# Patient Record
Sex: Female | Born: 1985 | Race: Black or African American | Hispanic: No | Marital: Single | Smoking: Current every day smoker
Health system: Southern US, Community
[De-identification: ages and names within clinical notes are randomized; demographics above are authoritative.]

## PROBLEM LIST (undated history)

## (undated) ENCOUNTER — Emergency Department (HOSPITAL_COMMUNITY): Admission: EM | Payer: Self-pay | Source: Home / Self Care

## (undated) DIAGNOSIS — F32A Depression, unspecified: Secondary | ICD-10-CM

## (undated) DIAGNOSIS — F329 Major depressive disorder, single episode, unspecified: Secondary | ICD-10-CM

## (undated) DIAGNOSIS — D649 Anemia, unspecified: Secondary | ICD-10-CM

## (undated) DIAGNOSIS — F319 Bipolar disorder, unspecified: Secondary | ICD-10-CM

## (undated) DIAGNOSIS — M779 Enthesopathy, unspecified: Secondary | ICD-10-CM

## (undated) DIAGNOSIS — K219 Gastro-esophageal reflux disease without esophagitis: Secondary | ICD-10-CM

---

## 2016-01-28 ENCOUNTER — Ambulatory Visit (HOSPITAL_COMMUNITY)
Admission: EM | Admit: 2016-01-28 | Discharge: 2016-01-28 | Disposition: A | Discharge status: Home / Self Care | Payer: Medicaid Other | Attending: Family Medicine | Admitting: Family Medicine

## 2016-01-28 ENCOUNTER — Encounter (HOSPITAL_COMMUNITY): Payer: Self-pay | Admitting: Emergency Medicine

## 2016-01-28 DIAGNOSIS — M545 Low back pain: Secondary | ICD-10-CM | POA: Insufficient documentation

## 2016-01-28 DIAGNOSIS — F17210 Nicotine dependence, cigarettes, uncomplicated: Secondary | ICD-10-CM | POA: Insufficient documentation

## 2016-01-28 DIAGNOSIS — R350 Frequency of micturition: Secondary | ICD-10-CM

## 2016-01-28 HISTORY — DX: Bipolar disorder, unspecified: F31.9

## 2016-01-28 HISTORY — DX: Major depressive disorder, single episode, unspecified: F32.9

## 2016-01-28 HISTORY — DX: Depression, unspecified: F32.A

## 2016-01-28 LAB — POCT URINALYSIS DIP (DEVICE)
BILIRUBIN URINE: NEGATIVE
GLUCOSE, UA: NEGATIVE mg/dL
Hgb urine dipstick: NEGATIVE
KETONES UR: NEGATIVE mg/dL
Leukocytes, UA: NEGATIVE
Nitrite: NEGATIVE
PH: 6 (ref 5.0–8.0)
Protein, ur: NEGATIVE mg/dL
SPECIFIC GRAVITY, URINE: 1.025 (ref 1.005–1.030)
Urobilinogen, UA: 0.2 mg/dL (ref 0.0–1.0)

## 2016-01-28 LAB — POCT PREGNANCY, URINE: PREG TEST UR: NEGATIVE

## 2016-01-28 MED ORDER — CIPROFLOXACIN HCL 500 MG PO TABS: 500.0000 mg | ORAL_TABLET | Freq: Two times a day (BID) | ORAL | 0 refills | Status: DC

## 2016-01-28 NOTE — ED Provider Notes (Signed)
[facility]    CSN: [id number] Arrival date & time: 01/28/16  1731     History   Chief Complaint Chief Complaint  Patient presents with  . Urinary Frequency    HPI Dawn Horton is a 30 y.o. female.   30 yo woman with 5-6 days of urinary frequency, lower abdominal discomfort and low back pain.  She's had UTI's before that felt like this.  No vaginal discharge.  LMP:  October.  Has Nexplanon, replaced last month.  Same partner for months.  Was recently tested for STD Unemployed.  Results for orders placed or performed during the hospital encounter of 01/28/16 -POCT urinalysis dip (device)      Result                                            Value                         Ref Range                      Glucose, UA                                       NEGATIVE                      NEGATIVE mg/dL                 Bilirubin Urine                                   NEGATIVE                      NEGATIVE                       Ketones, ur                                       NEGATIVE                      NEGATIVE mg/dL                 Specific Gravity, Urine                           1.025                         1.005 - 1.030                  Hgb urine dipstick                                NEGATIVE                      NEGATIVE  pH                                                6.0                           5.0 - 8.0                      Protein, ur                                       NEGATIVE                      NEGATIVE mg/dL                 Urobilinogen, UA                                  0.2                           0.0 - 1.0 mg/dL                Nitrite                                           NEGATIVE                      NEGATIVE                       Leukocytes, UA                                    NEGATIVE                      NEGATIVE                        Past Medical History:  Diagnosis Date  . Bipolar 1 disorder (HCC)   .  Depression     There are no active problems to display for this patient.   Past Surgical History:  Procedure Laterality Date  . CESAREAN SECTION      OB History    No data available       Home Medications    Prior to Admission medications   Medication Sig Start Date End Date Taking? Authorizing Provider  risperiDONE (RISPERDAL) 2 MG tablet Take 2 mg by mouth at bedtime.   Yes Historical Provider, MD  ciprofloxacin (CIPRO) 500 MG tablet Take 1 tablet (500 mg total) by mouth 2 (two) times daily. 01/28/16   Elvina SidleKurt Lindsie Simar, MD    Family History History reviewed. No pertinent family history.  Social History Social History  Substance Use Topics  . Smoking status: Current Every Day Smoker    Packs/day: 1.00    Years: 15.00    Types: Cigarettes  .  Smokeless tobacco: Never Used  . Alcohol use Yes     Comment: Occ     Allergies   Penicillins   Review of Systems Review of Systems  Constitutional: Negative.   Gastrointestinal: Negative for abdominal pain.  Genitourinary: Positive for frequency.     Physical Exam Triage Vital Signs ED Triage Vitals  Enc Vitals Group     BP 01/28/16 1803 113/67     Pulse Rate 01/28/16 1803 69     Resp 01/28/16 1803 18     Temp 01/28/16 1803 98.2 F (36.8 C)     Temp Source 01/28/16 1803 Oral     SpO2 01/28/16 1803 100 %     Weight --      Height --      Head Circumference --      Peak Flow --      Pain Score 01/28/16 1810 0     Pain Loc --      Pain Edu? --      Excl. in GC? --    No data found.   Updated Vital Signs BP 113/67 (BP Location: Right Arm)   Pulse 69   Temp 98.2 F (36.8 C) (Oral)   Resp 18   SpO2 100%    Physical Exam  Constitutional: She is oriented to person, place, and time. She appears well-developed and well-nourished.  HENT:  Right Ear: External ear normal.  Left Ear: External ear normal.  Mouth/Throat: Oropharynx is clear and moist.  Eyes: Conjunctivae and EOM are normal.  Neck: Normal  range of motion. Neck supple.  Pulmonary/Chest: Effort normal.  Musculoskeletal: Normal range of motion.  Neurological: She is alert and oriented to person, place, and time.  Skin: Skin is warm and dry.  Nursing note and vitals reviewed.    UC Treatments / Results  Labs (all labs ordered are listed, but only abnormal results are displayed) Labs Reviewed  URINE CULTURE  POCT URINALYSIS DIP (DEVICE)  POCT PREGNANCY, URINE    EKG  EKG Interpretation None       Radiology No results found.  Procedures Procedures (including critical care time)  Medications Ordered in UC Medications - No data to display   Initial Impression / Assessment and Plan / UC Course  I have reviewed the triage vital signs and the nursing notes.  Pertinent labs & imaging results that were available during my care of the patient were reviewed by me and considered in my medical decision making (see chart for details).  Clinical Course     Final Clinical Impressions(s) / UC Diagnoses   Final diagnoses:  Urinary frequency    New Prescriptions New Prescriptions   CIPROFLOXACIN (CIPRO) 500 MG TABLET    Take 1 tablet (500 mg total) by mouth 2 (two) times daily.     Elvina SidleKurt Kameo Bains, MD 01/28/16 [phone]

## 2016-01-28 NOTE — ED Triage Notes (Signed)
The patient presented to the [facility] with a complaint of lower abdominal pain and urinary frequency and urgency x 4 days.

## 2016-01-29 LAB — URINE CULTURE

## 2016-02-01 ENCOUNTER — Telehealth (HOSPITAL_COMMUNITY): Payer: Self-pay | Admitting: Emergency Medicine

## 2016-02-01 NOTE — Telephone Encounter (Signed)
-----   Message from Eustace MooreLaura W Murray, MD sent at 02/01/2016 11:18 AM EST ----- Clinical staff, please let patient know that urine culture does not clearly demonstrate a UTI.  If urinary symptoms persist, needs further evaluation for other causes of her symptoms.  LM

## 2016-02-01 NOTE — Telephone Encounter (Signed)
Called pt and notified of recent lab results Pt ID'd properly... Reports feeling better but still having lower abd pressure Pt is taking/tolarating well meds given at visit.  Adv pt if sx are not getting better to return or to f/u w/PCP Pt verb understanding.

## 2016-03-31 ENCOUNTER — Emergency Department (HOSPITAL_COMMUNITY)
Admission: EM | Admit: 2016-03-31 | Discharge: 2016-03-31 | Disposition: A | Discharge status: Home / Self Care | Payer: Medicaid Other | Attending: Emergency Medicine | Admitting: Emergency Medicine

## 2016-03-31 ENCOUNTER — Encounter (HOSPITAL_COMMUNITY): Payer: Self-pay | Admitting: Emergency Medicine

## 2016-03-31 ENCOUNTER — Emergency Department (HOSPITAL_COMMUNITY): Payer: Medicaid Other

## 2016-03-31 DIAGNOSIS — R0789 Other chest pain: Secondary | ICD-10-CM | POA: Diagnosis not present

## 2016-03-31 DIAGNOSIS — F17210 Nicotine dependence, cigarettes, uncomplicated: Secondary | ICD-10-CM | POA: Diagnosis not present

## 2016-03-31 DIAGNOSIS — R079 Chest pain, unspecified: Secondary | ICD-10-CM

## 2016-03-31 MED ORDER — IBUPROFEN 800 MG PO TABS
800.0000 mg | ORAL_TABLET | Freq: Once | ORAL | Status: DC
  Filled 2016-03-31: qty 1
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
## 2016-03-31 NOTE — ED Provider Notes (Signed)
[facility]   CSN: [id number] Arrival date & time: 03/31/16  1614    History   Chief Complaint Chief Complaint  Patient presents with  . Chest Pain    HPI Dawn Horton is a 31 y.o. female.  31 year old female with a history of bipolar disorder and depression presents to the emergency department for multiple complaints. She states that she primarily presented for left-sided chest pain which has been constant over the past week. She describes the pain as a tightness with intermittent sharp pain sensations. It is worse when sitting up. She states that she took Tylenol for symptoms with mild relief. She has not had any associated diaphoresis or shortness of breath. No history of trauma or injury. She does report heavy lifting of her children, ages 615 and 6611, at times. Patient was secondary complaint of sporadic right temporal headache. She denies any pain at present. She has also been having right knee pain intermittently. She states that she has a history of tendinitis and this feels similar.      Past Medical History:  Diagnosis Date  . Bipolar 1 disorder (HCC)   . Depression     There are no active problems to display for this patient.   Past Surgical History:  Procedure Laterality Date  . CESAREAN SECTION      OB History    No data available       Home Medications    Prior to Admission medications   Medication Sig Start Date End Date Taking? Authorizing Provider  ciprofloxacin (CIPRO) 500 MG tablet Take 1 tablet (500 mg total) by mouth 2 (two) times daily. 01/28/16   Elvina SidleKurt Lauenstein, MD  risperiDONE (RISPERDAL) 2 MG tablet Take 2 mg by mouth at bedtime.    Historical Provider, MD    Family History History reviewed. No pertinent family history.  Social History Social History  Substance Use Topics  . Smoking status: Current Every Day Smoker    Packs/day: 1.00    Years: 15.00    Types: Cigarettes  . Smokeless tobacco: Never Used  .  Alcohol use Yes     Comment: Occ     Allergies   Penicillins   Review of Systems Review of Systems Ten systems reviewed and are negative for acute change, except as noted in the HPI.    Physical Exam Updated Vital Signs BP 120/75   Pulse (!) 51   Temp 98.6 F (37 C) (Oral)   Resp 18   SpO2 95%   Physical Exam  Constitutional: She is oriented to person, place, and time. She appears well-developed and well-nourished. No distress.  Nontoxic and in no acute distress  HENT:  Head: Normocephalic and atraumatic.  Eyes: Conjunctivae and EOM are normal. No scleral icterus.  Neck: Normal range of motion.  No meningismus  Cardiovascular: Normal rate, regular rhythm and intact distal pulses.   Pulmonary/Chest: Effort normal. No respiratory distress. She has no wheezes. She has no rales. She exhibits tenderness (minimal TTP to left chest wall.).  Lungs clear to auscultation bilaterally  Musculoskeletal: Normal range of motion.  Normal range of motion of the right lower extremity and knee. Mild tenderness to palpation to the medial aspect of the right knee. No effusion, crepitus, or deformity.  Neurological: She is alert and oriented to person, place, and time. She exhibits normal muscle tone. Coordination normal.  Skin: Skin is warm and dry. No rash noted. She is not diaphoretic. No erythema. No pallor.  Psychiatric:  She has a normal mood and affect. Her behavior is normal.  Nursing note and vitals reviewed.    ED Treatments / Results  Labs (all labs ordered are listed, but only abnormal results are displayed) Labs Reviewed  I-STAT CHEM 8, ED  Rosezena Sensor, ED    ED ECG REPORT   Date: 03/31/2016  Rate: 76  Rhythm: normal sinus rhythm  QRS Axis: normal  Intervals: normal  ST/T Wave abnormalities: normal  Conduction Disutrbances:none  Narrative Interpretation: NSR; no acute ischemic change or STEMI  Old EKG Reviewed: none available  I have personally reviewed the  EKG tracing and agree with the computerized printout as noted.   Radiology Dg Chest 2 View  Result Date: 03/31/2016 CLINICAL DATA:  Acute onset of sharp mid chest pain. Initial encounter. EXAM: CHEST  2 VIEW COMPARISON:  None. FINDINGS: The lungs are well-aerated and clear. There is no evidence of focal opacification, pleural effusion or pneumothorax. The heart is normal in size; the mediastinal contour is within normal limits. No acute osseous abnormalities are seen. IMPRESSION: No acute cardiopulmonary process seen. Electronically Signed   By: Roanna Raider M.D.   On: 03/31/2016 17:11    Procedures Procedures (including critical care time)  Medications Ordered in ED Medications  ibuprofen (ADVIL,MOTRIN) tablet 800 mg (not administered)     Initial Impression / Assessment and Plan / ED Course  I have reviewed the triage vital signs and the nursing notes.  Pertinent labs & imaging results that were available during my care of the patient were reviewed by me and considered in my medical decision making (see chart for details).     31 year old female presents to the emergency department for evaluation of chest pain 1 week. She is hemodynamically stable and in no distress. No history of heart attack and heart score is 1 consistent with low risk of acute coronary event. Patient with reassuring chest x-ray and EKG. I recommended chemistry panel and rapid troponin. Patient agreed to plan; however, I was approached by nurse about 5 minutes after leaving the patient's room and was notified that she eloped.   Final Clinical Impressions(s) / ED Diagnoses   Final diagnoses:  Nonspecific chest pain    New Prescriptions New Prescriptions   No medications on file     Antony Madura, Cordelia Poche 03/31/16 2119    Lorre Nick, MD 04/03/16 1445

## 2016-03-31 NOTE — ED Triage Notes (Signed)
Pt sts CP with cough and inspiration and some lower abd pain and right knee pain

## 2016-05-20 ENCOUNTER — Encounter (HOSPITAL_COMMUNITY): Payer: Self-pay | Admitting: Emergency Medicine

## 2016-05-20 ENCOUNTER — Emergency Department (HOSPITAL_COMMUNITY)
Admission: EM | Admit: 2016-05-20 | Discharge: 2016-05-20 | Disposition: A | Discharge status: Home / Self Care | Payer: Medicaid Other | Attending: Emergency Medicine | Admitting: Emergency Medicine

## 2016-05-20 DIAGNOSIS — Z760 Encounter for issue of repeat prescription: Secondary | ICD-10-CM | POA: Diagnosis present

## 2016-05-20 DIAGNOSIS — F419 Anxiety disorder, unspecified: Secondary | ICD-10-CM | POA: Insufficient documentation

## 2016-05-20 DIAGNOSIS — F17210 Nicotine dependence, cigarettes, uncomplicated: Secondary | ICD-10-CM | POA: Diagnosis not present

## 2016-05-20 MED ORDER — RISPERIDONE 2 MG PO TABS: 2.0000 mg | ORAL_TABLET | Freq: Every day | ORAL | 0 refills | Status: AC

## 2016-05-20 NOTE — ED Provider Notes (Signed)
[facility] Note   CSN: [id number] Arrival date & time: 05/20/16  1926  By signing my name below, I, Cynda Acres, attest that this documentation has been prepared under the direction and in the presence of Roxy Horseman, PA-C. Electronically Signed: Cynda Acres, Scribe. 05/20/16. 9:55 PM.  History   Chief Complaint Chief Complaint  Patient presents with  . Medication Refill   HPI Comments: Dawn Horton is a 31 y.o. female with a history of bipolar and depression, who presents to the Emergency Department requesting a medication refill for Risperdal, which she ran out of 3 months ago. Patient states she has not had her medication in 3 months. Patient states she has been speaking with [facility], who she usually goes to, but they are not answering her phone calls. Patient reports having an appointment in February, in which the nurse rescheduled the appointment. Patient states she went to [facility] today, but felt if they were ignoring her. Patient reports drinking alcohol over the weekend, denies drug use. Patient denies any SI, HI, SOB, nausea, vomiting, diarrhea, or abdominal pain.   The history is provided by the patient. No language interpreter was used.    Past Medical History:  Diagnosis Date  . Bipolar 1 disorder (HCC)   . Depression     There are no active problems to display for this patient.   Past Surgical History:  Procedure Laterality Date  . CESAREAN SECTION      OB History    No data available       Home Medications    Prior to Admission medications   Medication Sig Start Date End Date Taking? Authorizing Provider  ciprofloxacin (CIPRO) 500 MG tablet Take 1 tablet (500 mg total) by mouth 2 (two) times daily. 01/28/16   Elvina Sidle, MD  risperiDONE (RISPERDAL) 2 MG tablet Take 2 mg by mouth at bedtime.    Historical Provider, MD    Family History History reviewed. No pertinent family history.   Social History Social History    Substance Use Topics  . Smoking status: Current Every Day Smoker    Packs/day: 1.00    Years: 15.00    Types: Cigarettes  . Smokeless tobacco: Never Used  . Alcohol use Yes     Comment: Occ     Allergies   Penicillins   Review of Systems Review of Systems  Respiratory: Negative for shortness of breath.   Gastrointestinal: Negative for abdominal pain, diarrhea, nausea and vomiting.  Psychiatric/Behavioral: Negative for hallucinations, self-injury and suicidal ideas.     Physical Exam Updated Vital Signs BP 93/70 (BP Location: Left Arm) Comment: pt moving  Pulse (!) 120 Comment: pt moving  Temp 98.1 F (36.7 C) (Oral)   Resp 20   SpO2 100%   Physical Exam  Constitutional: She is oriented to person, place, and time. She appears well-developed and well-nourished.  HENT:  Head: Normocephalic.  Eyes: EOM are normal.  Neck: Normal range of motion.  Pulmonary/Chest: Effort normal.  Abdominal: She exhibits no distension.  Musculoskeletal: Normal range of motion.  Neurological: She is alert and oriented to person, place, and time.  Psychiatric: She has a normal mood and affect. She expresses no homicidal and no suicidal ideation.  Nursing note and vitals reviewed.    ED Treatments / Results  DIAGNOSTIC STUDIES: Oxygen Saturation is 100% on RA, normal by my interpretation.    COORDINATION OF CARE: 9:54 PM Discussed treatment plan with pt at bedside and pt agreed to plan,  which includes a medication refill.   Labs (all labs ordered are listed, but only abnormal results are displayed) Labs Reviewed - No data to display  EKG  EKG Interpretation None       Radiology No results found.  Procedures Procedures (including critical care time)  Medications Ordered in ED Medications - No data to display   Initial Impression / Assessment and Plan / ED Course  I have reviewed the triage vital signs and the nursing notes.  Pertinent labs & imaging results that  were available during my care of the patient were reviewed by me and considered in my medical decision making (see chart for details).     Patient here for medication refill. She states that she has not had her Risperdal for several months. She reports trying multiple times to see her primary care doctor and with [facility], but she has been unable to get an appointment. She was ultimately referred to the emergency department for a refill. I notify the patient of this is not how the system is not to work, however I will give the patient a two-week refill as courtesy.  Final Clinical Impressions(s) / ED Diagnoses   Final diagnoses:  Medication refill  Anxiety    New Prescriptions Discharge Medication List as of 05/20/2016  9:52 PM     I personally performed the services described in this documentation, which was scribed in my presence. The recorded information has been reviewed and is accurate.       Roxy Horseman, PA-C 05/20/16 2246    Shaune Pollack, MD 05/21/16 [phone]

## 2016-05-20 NOTE — ED Triage Notes (Signed)
Patient with Bipolar/schizophrenia and has not had her meds for about 3 months.  She has been talking to [facility], usually goes there but they have not returned her phone calls.  She takes Risperidol and would like a refill.

## 2016-05-20 NOTE — ED Notes (Signed)
ED Provider at bedside. 

## 2016-05-20 NOTE — ED Notes (Addendum)
Pt reports being out of medication since December. She states that she went to [facility] and they canceled her apt.

## 2016-05-21 ENCOUNTER — Emergency Department (HOSPITAL_COMMUNITY)
Admission: EM | Admit: 2016-05-21 | Discharge: 2016-05-21 | Disposition: A | Discharge status: Other Acute Inpatient Hospital | Payer: Medicaid Other | Attending: Emergency Medicine | Admitting: Emergency Medicine

## 2016-05-21 ENCOUNTER — Encounter (HOSPITAL_COMMUNITY): Payer: Self-pay

## 2016-05-21 DIAGNOSIS — F17210 Nicotine dependence, cigarettes, uncomplicated: Secondary | ICD-10-CM | POA: Diagnosis not present

## 2016-05-21 DIAGNOSIS — Z79899 Other long term (current) drug therapy: Secondary | ICD-10-CM | POA: Insufficient documentation

## 2016-05-21 DIAGNOSIS — Z046 Encounter for general psychiatric examination, requested by authority: Secondary | ICD-10-CM | POA: Diagnosis present

## 2016-05-21 DIAGNOSIS — F3110 Bipolar disorder, current episode manic without psychotic features, unspecified: Secondary | ICD-10-CM | POA: Insufficient documentation

## 2016-05-21 DIAGNOSIS — F312 Bipolar disorder, current episode manic severe with psychotic features: Secondary | ICD-10-CM | POA: Diagnosis present

## 2016-05-21 LAB — COMPREHENSIVE METABOLIC PANEL
ALK PHOS: 75 U/L (ref 38–126)
ALT: 15 U/L (ref 14–54)
AST: 14 U/L — AB (ref 15–41)
Albumin: 4.9 g/dL (ref 3.5–5.0)
Anion gap: 13 (ref 5–15)
BUN: 11 mg/dL (ref 6–20)
CALCIUM: 9.5 mg/dL (ref 8.9–10.3)
CHLORIDE: 107 mmol/L (ref 101–111)
CO2: 19 mmol/L — AB (ref 22–32)
CREATININE: 0.88 mg/dL (ref 0.44–1.00)
GFR calc Af Amer: >60 mL/min (ref >60)
GFR calc non Af Amer: >60 mL/min (ref >60)
Glucose, Bld: 87 mg/dL (ref 65–99)
Potassium: 3.3 mmol/L — ABNORMAL LOW (ref 3.5–5.1)
SODIUM: 139 mmol/L (ref 135–145)
Total Bilirubin: 1 mg/dL (ref 0.3–1.2)
Total Protein: 8.4 g/dL — ABNORMAL HIGH (ref 6.5–8.1)

## 2016-05-21 LAB — CBC
HEMATOCRIT: 44 % (ref 36.0–46.0)
HEMOGLOBIN: 14.7 g/dL (ref 12.0–15.0)
MCH: 29.7 pg (ref 26.0–34.0)
MCHC: 33.4 g/dL (ref 30.0–36.0)
MCV: 88.9 fL (ref 78.0–100.0)
Platelets: 155 10*3/uL (ref 150–400)
RBC: 4.95 MIL/uL (ref 3.87–5.11)
RDW: 13.3 % (ref 11.5–15.5)
WBC: 9.9 10*3/uL (ref 4.0–10.5)

## 2016-05-21 LAB — ACETAMINOPHEN LEVEL: Acetaminophen (Tylenol), Serum: 26 ug/mL (ref 10–30)

## 2016-05-21 LAB — ETHANOL: Alcohol, Ethyl (B): <5 mg/dL (ref <5)

## 2016-05-21 LAB — SALICYLATE LEVEL: Salicylate Lvl: <7 mg/dL (ref 2.8–30.0)

## 2016-05-21 MED ORDER — ASENAPINE MALEATE 5 MG SL SUBL: 10.0000 mg | SUBLINGUAL_TABLET | Freq: Two times a day (BID) | SUBLINGUAL | Status: DC | PRN

## 2016-05-21 MED ORDER — ZIPRASIDONE MESYLATE 20 MG IM SOLR: 20.0000 mg | Freq: Once | INTRAMUSCULAR | Status: DC | PRN

## 2016-05-21 MED ORDER — HYDROXYZINE HCL 25 MG PO TABS
25.0000 mg | ORAL_TABLET | Freq: Two times a day (BID) | ORAL | Status: DC
  Filled 2016-05-21: qty 1

## 2016-05-21 MED ORDER — CARBAMAZEPINE 200 MG PO TABS
200.0000 mg | ORAL_TABLET | Freq: Two times a day (BID) | ORAL | Status: DC
Start: 2016-05-21 — End: 2016-05-21

## 2016-05-21 MED ORDER — RISPERIDONE 1 MG PO TBDP
1.0000 mg | ORAL_TABLET | Freq: Two times a day (BID) | ORAL | Status: DC
  Administered 2016-05-21: 1 mg via ORAL
  Filled 2016-05-21 (×2): qty 1

## 2016-05-21 NOTE — ED Provider Notes (Signed)
[facility] Note   CSN: [id number] Arrival date & time: 05/21/16  0247     History   Chief Complaint Chief Complaint  Patient presents with  . IVC    HPI Dawn Horton is a 31 y.o. female.  The history is provided by the patient and medical records. No language interpreter was used.   Dawn Horton is a 31 y.o. female  with a PMH of bipolar disorder and depression who presents to the Emergency Department under IVC. Per IVC paperwork, patient has been hyperverbal, hallucinating and off of her medication for the last week. She bought a large amount of Tylenol and was trying to get the children at home to take several pills. Family felt as if she was a danger to herself and others. Patient denies suicidal or homicidal thoughts and also denies auditory or visual hallucinations. She does state that she is on Risperdal and is taking it as directed, however chart review states that she was seen in the emergency department yesterday for a medication refill after being out of this medication for several months. She was given a 2 week refill of Risperdal at that time. Patient has no complaints.    Past Medical History:  Diagnosis Date  . Bipolar 1 disorder (HCC)   . Depression     There are no active problems to display for this patient.   Past Surgical History:  Procedure Laterality Date  . CESAREAN SECTION      OB History    No data available       Home Medications    Prior to Admission medications   Medication Sig Start Date End Date Taking? Authorizing Provider  ciprofloxacin (CIPRO) 500 MG tablet Take 1 tablet (500 mg total) by mouth 2 (two) times daily. Patient not taking: Reported on 05/21/2016 01/28/16   Elvina Sidle, MD  risperiDONE (RISPERDAL) 2 MG tablet Take 1 tablet (2 mg total) by mouth at bedtime. 05/20/16   Roxy Horseman, PA-C    Family History History reviewed. No pertinent family history.  Social History Social History  Substance Use  Topics  . Smoking status: Current Every Day Smoker    Packs/day: 1.00    Years: 15.00    Types: Cigarettes  . Smokeless tobacco: Never Used  . Alcohol use Yes     Comment: Occ     Allergies   Penicillins   Review of Systems Review of Systems  Constitutional: Negative for chills and fever.  HENT: Negative for congestion.   Eyes: Negative for visual disturbance.  Respiratory: Negative for shortness of breath.   Cardiovascular: Negative for chest pain.  Gastrointestinal: Negative for abdominal pain, nausea and vomiting.  Musculoskeletal: Negative for back pain and neck pain.  Skin: Negative for rash.  Neurological: Negative for headaches.  Psychiatric/Behavioral: Negative for self-injury, sleep disturbance and suicidal ideas.     Physical Exam Updated Vital Signs BP 96/75 (BP Location: Right Arm)   Pulse (!) 113   Temp 98.2 F (36.8 C) (Oral)   Resp 18   SpO2 96%   Physical Exam  Constitutional: She is oriented to person, place, and time. She appears well-developed and well-nourished. No distress.  HENT:  Head: Normocephalic and atraumatic.  Cardiovascular: Normal heart sounds.   No murmur heard. Tachycardic but regular.   Pulmonary/Chest: Effort normal and breath sounds normal. No respiratory distress.  Abdominal: Soft. She exhibits no distension. There is no tenderness.  Musculoskeletal: She exhibits no edema.  Neurological: She is alert  and oriented to person, place, and time.  Skin: Skin is warm and dry.  Nursing note and vitals reviewed.    ED Treatments / Results  Labs (all labs ordered are listed, but only abnormal results are displayed) Labs Reviewed  COMPREHENSIVE METABOLIC PANEL - Abnormal; Notable for the following:       Result Value   Potassium 3.3 (*)    CO2 19 (*)    Total Protein 8.4 (*)    AST 14 (*)    All other components within normal limits  ETHANOL  SALICYLATE LEVEL  ACETAMINOPHEN LEVEL  CBC  RAPID URINE DRUG SCREEN, HOSP  PERFORMED    EKG  EKG Interpretation None       Radiology No results found.  Procedures Procedures (including critical care time)  Medications Ordered in ED Medications  ziprasidone (GEODON) injection 20 mg (not administered)     Initial Impression / Assessment and Plan / ED Course  I have reviewed the triage vital signs and the nursing notes.  Pertinent labs & imaging results that were available during my care of the patient were reviewed by me and considered in my medical decision making (see chart for details).    Dawn Horton is a 31 y.o. female who presents to ED under IVC for concern she is a danger to herself or others. Patient hyperverbal and appears agitated on exam. Labs reviewed and reassuring. Medically cleared with disposition per TTS recommendations.   Final Clinical Impressions(s) / ED Diagnoses   Final diagnoses:  Involuntary commitment    New Prescriptions New Prescriptions   No medications on file       [facility] Nesiah Jump, PA-C 05/21/16 1610    Gilda Crease, MD 05/21/16 [phone]
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
## 2016-05-21 NOTE — BH Assessment (Signed)
[facility] Assessment Progress Note  Per Thedore Mins, MD, this pt requires psychiatric hospitalization at this time.  Pt presents under IVC initiated by her brother and upheld by Dr Jannifer Franklin.  At 15:24 Morrie Sheldon calls from [facility] to report that pt has been accepted to their facility by Dr Wendall Stade to the [facility] A Unit.  EDP Linwood Dibbles, MD concurs with this decision.  Pt's nurse has been notified, and agrees to call report to [phone].  Pt is to be transported via [facility].  Doylene Canning, MA Triage Specialist [phone]

## 2016-05-21 NOTE — ED Notes (Signed)
TTS at bedside. 

## 2016-05-21 NOTE — BH Assessment (Signed)
Assessment Note  Dawn Horton is an 31 y.o. female. Patient presents to [facility] with IVC taken out by brother. Per IVC: "Patient is Bipolar and has Schizophrenia. She hasn't been taking her medications. Patient is hallucinating, talking and laughing out loud, and she is at times aggressive towards others. Patient brought multiple bottles of tylenol and tried to force her kids to take them".  Writer met with patient face to face. She was loud, angry, belligerent, and used an abundant amount or profanity during the TTS assessment. Patient was a poor historian. She was unable to answer questions appropriately. She responded with curse words mostly. She denied suicidal ideations. No HI. Denies AVH's. . She also presented to the [facility] for a separate admission yesterday requesting. During that time she stated that she has not taken her Risperdal for 3 months. She was hospitalized at [facility] for psychiatric reasons in the past. Patient is unable to recall the date of that admission. BAL is negative. UDS was not completed at the time of the assessment.    Diagnosis: Bipolar I Disorder, Manic Episode   Past Medical History:  Past Medical History:  Diagnosis Date  . Bipolar 1 disorder ([place])   . Depression     Past Surgical History:  Procedure Laterality Date  . CESAREAN SECTION      Family History: History reviewed. No pertinent family history.  Social History:  reports that she has been smoking Cigarettes.  She has a 15.00 pack-year smoking history. She has never used smokeless tobacco. She reports that she drinks alcohol. She reports that she does not use drugs.  Additional Social History:  Alcohol / Drug Use Pain Medications: SEE MAR Prescriptions: SEE MAR Over the Counter: SEE MAR History of alcohol / drug use?: No history of alcohol / drug abuse (No UDS completed at the time of the TTS assessment; BAL is negative )  CIWA: CIWA-Ar BP: 136/85 Pulse Rate: (!) 103 COWS:    Allergies:  Allergies   Allergen Reactions  . Penicillins Hives    Home Medications:  (Not in a hospital admission)  OB/GYN Status:  No LMP recorded. Patient has had an implant.  General Assessment Data Location of Assessment: [facility] TTS Assessment: In system Is this a Tele or Face-to-Face Assessment?: Face-to-Face Is this an Initial Assessment or a Re-assessment for this encounter?: Initial Assessment Marital status: Single Maiden name:  (n/a) Is patient pregnant?: No Pregnancy Status: No Living Arrangements: Other (Comment), Children (lives alone with 2 children (11, 5)) Can pt return to current living arrangement?: Yes Admission Status: Voluntary Is patient capable of signing voluntary admission?: No Referral Source: [employer] type:  (Medicaid)     Crisis Care Plan Living Arrangements: Other (Comment), Children (lives alone with 2 children (11, 5)) Legal Guardian: Other: (no legal guardian ) Name of Psychiatrist:  (no psychiatrist ) Name of Therapist:  (no therapist )  Education Status Is patient currently in school?: No Current Grade:  (n/a) Highest grade of school patient has completed:  (unk) Name of school:  (n/a) Contact person:  (n/a)  Risk to self with the past 6 months Suicidal Ideation: No Has patient been a risk to self within the past 6 months prior to admission? : No Suicidal Intent: No Has patient had any suicidal intent within the past 6 months prior to admission? : No Is patient at risk for suicide?: No Suicidal Plan?: No Has patient had any suicidal plan within the past 6 months prior to admission? :  No Access to Means: No What has been your use of drugs/alcohol within the last 12 months?:  (pt denies; per hx patient was drinking over the weekend) Previous Attempts/Gestures: No How many times?:  (0) Other Self Harm Risks:  (denies ) Triggers for Past Attempts: Other (Comment) (denies prior attempts ) Intentional Self Injurious Behavior: None Family  Suicide History: No Recent stressful life event(s): Other (Comment) (patient denies ) Persecutory voices/beliefs?: No Depression: No Depression Symptoms:  (denies ) Substance abuse history and/or treatment for substance abuse?: No Suicide prevention information given to non-admitted patients: Not applicable  Risk to Others within the past 6 months Homicidal Ideation: No Does patient have any lifetime risk of violence toward others beyond the six months prior to admission? : No Thoughts of Harm to Others: No Current Homicidal Intent: No Current Homicidal Plan: No Access to Homicidal Means: No Identified Victim:  (n/a) History of harm to others?: No Assessment of Violence: None Noted Violent Behavior Description:  (patient is calm and cooperative ) Does patient have access to weapons?: No Criminal Charges Pending?: No Does patient have a court date: No Is patient on probation?: No  Psychosis Hallucinations: None noted Delusions: None noted  Mental Status Report Appearance/Hygiene: Disheveled, In scrubs Eye Contact: Fair Motor Activity: Restlessness Speech: Aggressive, Pressured, Rapid, Loud Level of Consciousness: Irritable, Restless Mood: Angry, Preoccupied, Irritable Affect: Angry, Apprehensive, Irritable, Inconsistent with thought content, Preoccupied Anxiety Level: Minimal Thought Processes: Circumstantial, Flight of Ideas, Irrelevant Judgement: Impaired Orientation: Person, Place, Time, Situation Obsessive Compulsive Thoughts/Behaviors: None  Cognitive Functioning Concentration: Decreased Memory: Recent Intact, Remote Intact IQ: Average Insight: Poor Impulse Control: Fair Appetite: Fair Weight Loss:  (none reported) Weight Gain:  (none reported) Sleep: Decreased Total Hours of Sleep:  (varies ) Vegetative Symptoms: None  ADLScreening [facility] Assessment Services) Patient's cognitive ability adequate to safely complete daily activities?: Yes Patient able to  express need for assistance with ADLs?: Yes Independently performs ADLs?: Yes (appropriate for developmental age)  Prior Inpatient Therapy Prior Inpatient Therapy: Yes Prior Therapy Dates:  ("I can't remember") Prior Therapy Facilty/Provider(s):  [facility]) Reason for Treatment:  (Bipolar Disorder)  Prior Outpatient Therapy Prior Outpatient Therapy: No Prior Therapy Dates:  (n/a) Prior Therapy Facilty/Provider(s):  (n/a) Reason for Treatment:  (n/a) Does patient have an ACCT team?: No Does patient have Intensive In-House Services?  : No Does patient have [facility] services? : No Does patient have P4CC services?: No  ADL Screening (condition at time of admission) Patient's cognitive ability adequate to safely complete daily activities?: Yes Is the patient deaf or have difficulty hearing?: No Does the patient have difficulty seeing, even when wearing glasses/contacts?: No Does the patient have difficulty concentrating, remembering, or making decisions?: No Patient able to express need for assistance with ADLs?: Yes Does the patient have difficulty dressing or bathing?: No Independently performs ADLs?: Yes (appropriate for developmental age) Does the patient have difficulty walking or climbing stairs?: No Weakness of Legs: None Weakness of Arms/Hands: None  Home Assistive Devices/Equipment Home Assistive Devices/Equipment: None    Abuse/Neglect Assessment (Assessment to be complete while patient is alone) Physical Abuse: Denies Verbal Abuse: Denies Sexual Abuse: Denies Exploitation of patient/patient's resources: Denies Self-Neglect: Denies     Regulatory affairs officer (For Healthcare) Does Patient Have a Medical Advance Directive?: No Would patient like information on creating a medical advance directive?: No - Patient declined Nutrition Screen- [facility] Adult/[facility] Patient's home diet: Regular  Additional Information 1:1 In Past 12 Months?: No CIRT Risk: No Elopement Risk:  No Does  patient have medical clearance?: Yes     Disposition:  Disposition Initial Assessment Completed for this Encounter: Yes Disposition of Patient: Inpatient treatment program (Dr. Darleene Cleaver and Waylan Boga, DNP, patient meets INPT [place]) Type of inpatient treatment program: Adult  On Site Evaluation by:   Reviewed with Physician:    Waldon Merl 05/21/2016 10:52 AM

## 2016-05-21 NOTE — ED Notes (Signed)
Requested a urine sample. 

## 2016-05-21 NOTE — Progress Notes (Signed)
CSW completed CPS report with [place] DSS worker.   Stacy Gardner, LCSWA Clinical Social Worker [phone]

## 2016-05-21 NOTE — Progress Notes (Addendum)
05/21/16 1353:  LRT introduced self to pt and offered activities.  Pt declined.  Caroll Rancher, LRT/CTRS

## 2016-05-21 NOTE — ED Triage Notes (Signed)
Pt is under IVC by her brother Pt is bipolar and schizophrenic and hasn't been taking her medications Pt is hallucinating, talking and laughing out loud, she is at times aggressive towards other Pt bought multiple bottles of tylenol and tried to force her kids to take them

## 2016-05-21 NOTE — ED Notes (Signed)
Pt refuses to take her scheduled medication

## 2016-05-21 NOTE — ED Provider Notes (Signed)
Pt has been accepted for transfer.  Vitals:   05/21/16 0828 05/21/16 1212  BP: 136/85 115/88  Pulse: (!) 103 100  Resp: 20   Temp: 98.8 F (37.1 C) 99.1 F (37.3 C)   Appears medically stable.   Linwood Dibbles, MD 05/21/16 [phone]

## 2016-05-21 NOTE — ED Notes (Addendum)
Pt transferred from TCU. She appears flat and angry. She essentially declines to answer assessment questions, Art therapist to "look it up, I dont need to be here, I want to go home.". States she was IVC'd by family and she is done with them. Continues to state she wants to go. Support and encouragement provided. Explained that th ultimate decision lies with the Psychiatry team and the writer is here to maintain safety and comfort. She flatly denies SI/HI/AVH but is limited in her responses to other questions. Her family keeps trying to call, but based on pts words, I am going to limit family contact today as I suspect it could result in further decompensation. Safety has been maintained with q15 min obs. Will continue current POC pending disposition.

## 2016-06-11 ENCOUNTER — Ambulatory Visit (HOSPITAL_COMMUNITY)
Admission: EM | Admit: 2016-06-11 | Discharge: 2016-06-11 | Discharge status: Against Medical Advice | Payer: Medicaid Other

## 2016-06-14 ENCOUNTER — Encounter (HOSPITAL_COMMUNITY): Payer: Self-pay | Admitting: *Deleted

## 2016-06-14 ENCOUNTER — Inpatient Hospital Stay (HOSPITAL_COMMUNITY)
Admission: AD | Admit: 2016-06-14 | Discharge: 2016-06-14 | Disposition: A | Discharge status: Home / Self Care | Payer: Medicaid Other | Source: Ambulatory Visit | Attending: Obstetrics and Gynecology | Admitting: Obstetrics and Gynecology

## 2016-06-14 DIAGNOSIS — Z880 Allergy status to penicillin: Secondary | ICD-10-CM | POA: Insufficient documentation

## 2016-06-14 DIAGNOSIS — F319 Bipolar disorder, unspecified: Secondary | ICD-10-CM | POA: Insufficient documentation

## 2016-06-14 DIAGNOSIS — R1030 Lower abdominal pain, unspecified: Secondary | ICD-10-CM | POA: Diagnosis not present

## 2016-06-14 DIAGNOSIS — F17210 Nicotine dependence, cigarettes, uncomplicated: Secondary | ICD-10-CM | POA: Diagnosis not present

## 2016-06-14 DIAGNOSIS — F329 Major depressive disorder, single episode, unspecified: Secondary | ICD-10-CM | POA: Diagnosis not present

## 2016-06-14 DIAGNOSIS — R102 Pelvic and perineal pain: Secondary | ICD-10-CM | POA: Insufficient documentation

## 2016-06-14 LAB — WET PREP, GENITAL
CLUE CELLS WET PREP: NONE SEEN
SPERM: NONE SEEN
Trich, Wet Prep: NONE SEEN
Yeast Wet Prep HPF POC: NONE SEEN

## 2016-06-14 LAB — URINALYSIS, ROUTINE W REFLEX MICROSCOPIC
BILIRUBIN URINE: NEGATIVE
Glucose, UA: NEGATIVE mg/dL
Hgb urine dipstick: NEGATIVE
Ketones, ur: NEGATIVE mg/dL
Leukocytes, UA: NEGATIVE
NITRITE: NEGATIVE
PROTEIN: NEGATIVE mg/dL
SPECIFIC GRAVITY, URINE: 1.008 (ref 1.005–1.030)
pH: 6 (ref 5.0–8.0)

## 2016-06-14 LAB — POCT PREGNANCY, URINE: PREG TEST UR: NEGATIVE

## 2016-06-14 NOTE — MAU Note (Signed)
Pt presents with lower abdominal and vaginal pain that has been intermittent since she had her Nexplanon placed in January.  LMP was 06/10/15.  Since ending her period, the pain has gotten so bad "I can hardly walk."

## 2016-06-14 NOTE — Discharge Instructions (Signed)

## 2016-06-14 NOTE — MAU Provider Note (Signed)
History   G3P2013 no preg in with abd and vaginal pain for 4 days. States she just finished her period but is still having pain. Denies any abnormal discharge. Pt states pain is relieved  by aleve.  CSN: [id number]  Arrival date & time 06/14/16  1406   None     Chief Complaint  Patient presents with  . Abdominal Pain    HPI  Past Medical History:  Diagnosis Date  . Bipolar 1 disorder (H[facility])   . Depression     Past Surgical History:  Procedure Laterality Date  . CESAREAN SECTION      History reviewed. No pertinent family history.  Social History  Substance Use Topics  . Smoking status: Current Every Day Smoker    Packs/day: 1.00    Years: 15.00    Types: Cigarettes  . Smokeless tobacco: Never Used  . Alcohol use Yes     Comment: Occ    OB History    Gravida Para Term Preterm AB Living   0 1 2   SAB TAB Ectopic Multiple Live Births   1 0 0 0 20      Review of Systems  Constitutional: Negative.   HENT: Negative.   Eyes: Negative.   Respiratory: Negative.   Cardiovascular: Negative.   Gastrointestinal: Positive for abdominal pain.  Endocrine: Negative.   Genitourinary: Negative.   Musculoskeletal: Negative.   Skin: Negative.   Allergic/Immunologic: Negative.   Neurological: Negative.   Hematological: Negative.   Psychiatric/Behavioral: Negative.     Allergies  Penicillins  Home Medications    BP 108/71 (BP Location: Left Arm)   Pulse 76   Temp 98.2 F (36.8 C) (Oral)   Resp 18   Ht 5' 4.5" (1.638 m)   Wt 157 lb (71.2 kg)   SpO2 100%   BMI 26.53 kg/m   Physical Exam  Constitutional: She is oriented to person, place, and time. She appears well-developed and well-nourished.  HENT:  Head: Normocephalic.  Eyes: Pupils are equal, round, and reactive to light.  Neck: Normal range of motion.  Cardiovascular: Normal rate, regular rhythm, normal heart sounds and intact distal pulses.   Pulmonary/Chest: Effort normal and breath sounds  normal.  Abdominal: Soft. Bowel sounds are normal.  Genitourinary: Vagina normal and uterus normal.  Musculoskeletal: Normal range of motion.  Neurological: She is alert and oriented to person, place, and time. She has normal reflexes.  Skin: Skin is warm and dry.  Psychiatric: She has a normal mood and affect. Her behavior is normal. Judgment and thought content normal.    MAU Course  Procedures (including critical care time)  Labs Reviewed  URINALYSIS, ROUTINE W REFLEX MICROSCOPIC - Abnormal; Notable for the following:       Result Value   Color, Urine STRAW (*)    All other components within normal limits  WET PREP, GENITAL  RPR  HIV ANTIBODY (ROUTINE TESTING)  POCT PREGNANCY, URINE  GC/CHLAMYDIA PROBE AMP ([place]) NOT AT [facility]   No results found.   No diagnosis found.    MDM  VSS, abd soft non tender. No abnormal discharge. Wet prep neg, u/s neg. Will d/c home. To continue aleve for discomfort.

## 2016-06-15 LAB — HIV ANTIBODY (ROUTINE TESTING W REFLEX): HIV Screen 4th Generation wRfx: NONREACTIVE

## 2016-06-15 LAB — RPR: RPR Ser Ql: NONREACTIVE

## 2016-06-16 LAB — GC/CHLAMYDIA PROBE AMP (~~LOC~~) NOT AT ARMC
Chlamydia: NEGATIVE
NEISSERIA GONORRHEA: NEGATIVE

## 2016-08-31 ENCOUNTER — Inpatient Hospital Stay (HOSPITAL_COMMUNITY)
Admission: AD | Admit: 2016-08-31 | Discharge: 2016-08-31 | Disposition: A | Discharge status: Home / Self Care | Payer: Medicaid Other | Source: Ambulatory Visit | Attending: Obstetrics and Gynecology | Admitting: Obstetrics and Gynecology

## 2016-08-31 ENCOUNTER — Encounter (HOSPITAL_COMMUNITY): Payer: Self-pay | Admitting: *Deleted

## 2016-08-31 DIAGNOSIS — Z880 Allergy status to penicillin: Secondary | ICD-10-CM | POA: Diagnosis not present

## 2016-08-31 DIAGNOSIS — O99331 Smoking (tobacco) complicating pregnancy, first trimester: Secondary | ICD-10-CM | POA: Diagnosis not present

## 2016-08-31 DIAGNOSIS — F17210 Nicotine dependence, cigarettes, uncomplicated: Secondary | ICD-10-CM | POA: Diagnosis not present

## 2016-08-31 DIAGNOSIS — M25511 Pain in right shoulder: Secondary | ICD-10-CM | POA: Insufficient documentation

## 2016-08-31 DIAGNOSIS — M62838 Other muscle spasm: Secondary | ICD-10-CM

## 2016-08-31 DIAGNOSIS — O26891 Other specified pregnancy related conditions, first trimester: Secondary | ICD-10-CM | POA: Insufficient documentation

## 2016-08-31 HISTORY — DX: Anemia, unspecified: D64.9

## 2016-08-31 HISTORY — DX: Enthesopathy, unspecified: M77.9

## 2016-08-31 HISTORY — DX: Gastro-esophageal reflux disease without esophagitis: K21.9

## 2016-08-31 MED ORDER — IBUPROFEN 600 MG PO TABS: 600.0000 mg | ORAL_TABLET | Freq: Once | ORAL | Status: DC

## 2016-08-31 MED ORDER — IBUPROFEN 600 MG PO TABS: 600.0000 mg | ORAL_TABLET | Freq: Three times a day (TID) | ORAL | 0 refills | Status: DC | PRN

## 2016-08-31 MED ORDER — CYCLOBENZAPRINE HCL 5 MG PO TABS
5.0000 mg | ORAL_TABLET | Freq: Once | ORAL | Status: AC
  Administered 2016-08-31: 5 mg via ORAL
  Filled 2016-08-31: qty 1

## 2016-08-31 MED ORDER — CYCLOBENZAPRINE HCL 5 MG PO TABS: 5.0000 mg | ORAL_TABLET | Freq: Three times a day (TID) | ORAL | 0 refills | Status: DC | PRN

## 2016-08-31 NOTE — MAU Provider Note (Signed)
MAU HISTORY AND PHYSICAL  Chief Complaint:  Shoulder Pain   Dawn Horton is a 31 y.o.  [id number] with IUP at Unknown presenting for Shoulder Pain  Patient was in her usual state of health until yesterday evening when she developed right shoulder pain after scrubbing her bathtub and moving furniture all day. She describes the pain as sharp, constant, worsening. It does not radiate. No prior episodes of similar pain. Pain came on suddenly and woke her from sleep.  She took aleve at home with minimal improvement. She denies inciting trauma. She denies N/V/D/C.    Past Medical History:  Diagnosis Date  . Anemia   . Bipolar 1 disorder (HCC)   . Depression   . GERD (gastroesophageal reflux disease)   . Tendinitis     Past Surgical History:  Procedure Laterality Date  . CESAREAN SECTION      History reviewed. No pertinent family history.  Social History  Substance Use Topics  . Smoking status: Current Every Day Smoker    Packs/day: 1.00    Years: 15.00    Types: Cigarettes  . Smokeless tobacco: Never Used  . Alcohol use Yes     Comment: Occ    Allergies  Allergen Reactions  . Penicillins Hives    Prescriptions Prior to Admission  Medication Sig Dispense Refill Last Dose  . ciprofloxacin (CIPRO) 500 MG tablet Take 1 tablet (500 mg total) by mouth 2 (two) times daily. (Patient not taking: Reported on 05/21/2016) 10 tablet 0 Not Taking at Unknown time  . risperiDONE (RISPERDAL) 2 MG tablet Take 1 tablet (2 mg total) by mouth at bedtime. 14 tablet 0 not started    Review of Systems - Negative except for what is mentioned in HPI.  Physical Exam  Blood pressure 114/68, pulse (!) 57, temperature 97.8 F (36.6 C), temperature source Oral, resp. rate 18, height 5\' 4"  (1.626 m), weight 77.1 kg (170 lb). GENERAL: Well-developed, well-nourished female in no acute distress.  EXTREMITIES: +right shoulder tenderness over deltoid, +pain with internal and external rotation of the arm. no  edema, 2+ distal pulses. BACK: no spinal tenderness to palpation Neuro: sensation intact UE bilaterally   Labs: No results found for this or any previous visit (from the past 24 hour(s)).  Imaging Studies:  No results found.  Assessment: Dawn Horton is  31 y.o. [id number] at Unknown presents with Shoulder Pain .  Plan: Right shoulder pain - most likely Muscle Spasm secondary to overexertion.  - flexeril 5 mg q8H PRN spasm - continue home naproxen (patient declines ibuprofen due to hx GERD)_ - apply heating pad - recommend hydration - per patient she already has a PCP Follow up on 7/18   Howard PouchLauren Feng, MD 7/15/20182:12 AM  The patient was seen and examined by me also Agree with note Ready for discharge Will have her followup with her doctor as scheduled Agree with plan for NSAID and Flexeril Warned against driving while taking Flexeril  I confirm that I have verified the information documented in the resident's note and that I have also personally reperformed the physical exam and all medical decision making activities.     Aviva SignsWilliams, Sayyid Harewood L, CNM

## 2016-08-31 NOTE — MAU Note (Signed)
Pt reports she has had pain in her right shoulder for the last 2 days. Denies any injury to shoulder . Reports she has been cleaning her house and scrubbing thing for the past few days is the only activty

## 2016-08-31 NOTE — Discharge Instructions (Signed)
You were seen and evaluated in the MAU. I believe you have a muscle spasm in your right shoulder that is causing your pain, likely due to over doing it with cleaning at home.  You will be sent with a script for a muscle relaxer. Please take caution with this medication as it can make you sleepy. Do not take more than prescribed and do not drive after taking this medication.  You may take your home naproxen as needed for pain.  As discussed, stay hydrated, and use heat over the spasm.  Please follow up with your primary care doctor for your scheduled appointment in 3 days. If your symptoms worsen or fail to improve, seek medical attention.   Muscle Cramps and Spasms Muscle cramps and spasms occur when a muscle or muscles tighten and you have no control over this tightening (involuntary muscle contraction). They are a common problem and can develop in any muscle. The most common place is in the calf muscles of the leg. Muscle cramps and muscle spasms are both involuntary muscle contractions, but there are some differences between the two:  Muscle cramps are painful. They come and go and may last a few seconds to 15 minutes. Muscle cramps are often more forceful and last longer than muscle spasms.  Muscle spasms may or may not be painful. They may also last just a few seconds or much longer.  Certain medical conditions, such as diabetes or Parkinson disease, can make it more likely to develop cramps or spasms. However, cramps or spasms are usually not caused by a serious underlying problem. Common causes include:  Overexertion.  Overuse from repetitive motions, or doing the same thing over and over.  Remaining in a certain position for a long period of time.  Improper preparation, form, or technique while playing a sport or doing an activity.  Dehydration.  Injury.  Side effects of some medicines.  Abnormally low levels of the salts and ions in your blood (electrolytes), especially  potassium and calcium. This could happen if you are taking water pills (diuretics) or if you are pregnant.  In many cases, the cause of muscle cramps or spasms is unknown. Follow these instructions at home:  Stay well hydrated. Drink enough fluid to keep your urine clear or pale yellow.  Try massaging, stretching, and relaxing the affected muscle.  If directed, apply heat to tight or tense muscles as often as told by your health care provider. Use the heat source that your health care provider recommends, such as a moist heat pack or a heating pad. ? Place a towel between your skin and the heat source. ? Leave the heat on for 20-30 minutes. ? Remove the heat if your skin turns bright red. This is especially important if you are unable to feel pain, heat, or cold. You may have a greater risk of getting burned.  If directed, put ice on the affected area. This may help if you are sore or have pain after a cramp or spasm. ? Put ice in a plastic bag. ? Place a towel between your skin and the bag. ? Leavethe ice on for 20 minutes, 2-3 times a day.  Take over-the-counter and prescription medicines only as told by your health care provider.  Pay attention to any changes in your symptoms. Contact a health care provider if:  Your cramps or spasms get more severe or happen more often.  Your cramps or spasms do not improve over time. This information is not  intended to replace advice given to you by your health care provider. Make sure you discuss any questions you have with your health care provider. Document Released: 07/26/2001 Document Revised: 03/07/2015 Document Reviewed: 11/07/2014 Elsevier Interactive Patient Education  2018 [employer].

## 2017-08-09 DIAGNOSIS — E559 Vitamin D deficiency, unspecified: Secondary | ICD-10-CM | POA: Insufficient documentation

## 2017-08-09 DIAGNOSIS — E669 Obesity, unspecified: Secondary | ICD-10-CM | POA: Insufficient documentation

## 2017-08-09 DIAGNOSIS — D649 Anemia, unspecified: Secondary | ICD-10-CM | POA: Insufficient documentation

## 2017-08-09 DIAGNOSIS — Z720 Tobacco use: Secondary | ICD-10-CM | POA: Insufficient documentation

## 2017-11-06 DIAGNOSIS — K219 Gastro-esophageal reflux disease without esophagitis: Secondary | ICD-10-CM | POA: Insufficient documentation

## 2017-11-06 DIAGNOSIS — E785 Hyperlipidemia, unspecified: Secondary | ICD-10-CM | POA: Insufficient documentation

## 2017-12-18 ENCOUNTER — Ambulatory Visit (INDEPENDENT_AMBULATORY_CARE_PROVIDER_SITE_OTHER): Payer: Medicaid Other

## 2017-12-18 ENCOUNTER — Encounter (HOSPITAL_COMMUNITY): Payer: Self-pay | Admitting: Emergency Medicine

## 2017-12-18 ENCOUNTER — Ambulatory Visit (HOSPITAL_COMMUNITY)
Admission: EM | Admit: 2017-12-18 | Discharge: 2017-12-18 | Disposition: A | Discharge status: Home / Self Care | Payer: Medicaid Other | Attending: Family Medicine | Admitting: Family Medicine

## 2017-12-18 ENCOUNTER — Other Ambulatory Visit: Payer: Self-pay

## 2017-12-18 DIAGNOSIS — M79672 Pain in left foot: Secondary | ICD-10-CM

## 2017-12-18 DIAGNOSIS — M79642 Pain in left hand: Secondary | ICD-10-CM | POA: Diagnosis not present

## 2017-12-18 DIAGNOSIS — W19XXXA Unspecified fall, initial encounter: Secondary | ICD-10-CM

## 2017-12-18 MED ORDER — CYCLOBENZAPRINE HCL 10 MG PO TABS: 10.0000 mg | ORAL_TABLET | Freq: Two times a day (BID) | ORAL | 0 refills | Status: DC | PRN

## 2017-12-18 NOTE — ED Triage Notes (Signed)
Patient fell on a floor.  Patient reports landing on buttocks and sharp pain in left ankle and left little finger pain and left hand pain and swelling.

## 2017-12-18 NOTE — Discharge Instructions (Addendum)
Please take Tylenol [phone] mg every 4-6 hours You may use flexeril as needed to help with pain. This is a muscle relaxer and causes sedation- please use only at bedtime or when you will be home and not have to drive/work  Ice foot and hand  Please follow-up if pain worsening, developing weakness, numbness or tingling, loss of bowel or bladder control, numbness between thighs, not improving over the next 1 to 2 weeks

## 2017-12-19 NOTE — ED Provider Notes (Signed)
[facility]    CSN: [id number] Arrival date & time: 12/18/17  1628     History   Chief Complaint Chief Complaint  Patient presents with  . Fall    HPI Dawn Horton is a 32 y.o. female history of bipolar type I on lithium, GERD presenting today for evaluation of left foot pain, left hand pain secondary to a fall.  Patient states that she slipped and fell entering a store yesterday evening.  Believes the sore was wet and her foot went behind her.  Landed awkwardly on her left foot and caught her fall with her left hand.  Needed assistance to stand back up.  Patient has since had pain with weightbearing and difficulty moving her pinky on her left hand.  Denies loss of consciousness or hitting head with fall.  She is tried some Aleve without relief.  She also notes some back pain.  Denies any changes in urination or bowel movements.  Denies numbness or tingling.  Denies any weakness.  HPI  Past Medical History:  Diagnosis Date  . Anemia   . Bipolar 1 disorder (HCC)   . Depression   . GERD (gastroesophageal reflux disease)   . Tendinitis     Patient Active Problem List   Diagnosis Date Noted  . Bipolar affective disorder, current episode manic with psychotic symptoms (HCC) 05/21/2016    Past Surgical History:  Procedure Laterality Date  . CESAREAN SECTION      OB History    Gravida  3   Para  2   Term  2   Preterm  0   AB  1   Living  2     SAB  1   TAB  0   Ectopic  0   Multiple  0   Live Births  2            Home Medications    Prior to Admission medications   Medication Sig Start Date End Date Taking? Authorizing Provider  hydrOXYzine (ATARAX/VISTARIL) 25 MG tablet Take 25 mg by mouth 3 (three) times daily as needed.   Yes [provider]  lithium carbonate (ESKALITH) 450 MG CR tablet Take by mouth 2 (two) times daily.   Yes [provider]  risperiDONE (RISPERDAL) 2 MG tablet Take 1 tablet (2 mg total) by mouth at  bedtime. 05/20/16  Yes Roxy Horseman, PA-C  cyclobenzaprine (FLEXERIL) 10 MG tablet Take 1 tablet (10 mg total) by mouth 2 (two) times daily as needed for muscle spasms. 12/18/17   Wieters, Junius Creamer, PA-C    Family History Family History  Problem Relation Age of Onset  . Healthy Mother   . Healthy Father     Social History Social History   Tobacco Use  . Smoking status: Current Every Day Smoker    Packs/day: 1.00    Years: 15.00    Pack years: 15.00    Types: Cigarettes  . Smokeless tobacco: Never Used  Substance Use Topics  . Alcohol use: Yes    Comment: Occ  . Drug use: No    Types: Marijuana    Comment: no longer using     Allergies   Penicillins   Review of Systems Review of Systems  Constitutional: Negative for fatigue and fever.  Eyes: Negative for visual disturbance.  Respiratory: Negative for shortness of breath.   Cardiovascular: Negative for chest pain.  Gastrointestinal: Negative for abdominal pain, nausea and vomiting.  Genitourinary: Negative for decreased urine  volume and difficulty urinating.  Musculoskeletal: Positive for arthralgias, back pain, gait problem, joint swelling and myalgias. Negative for neck pain and neck stiffness.  Skin: Negative for color change, rash and wound.  Neurological: Negative for dizziness, weakness, light-headedness and headaches.     Physical Exam Triage Vital Signs ED Triage Vitals  Enc Vitals Group     BP 12/18/17 1728 100/62     Pulse Rate 12/18/17 1728 74     Resp 12/18/17 1728 18     Temp 12/18/17 1728 98.2 F (36.8 C)     Temp Source 12/18/17 1728 Oral     SpO2 12/18/17 1728 98 %     Weight --      Height --      Head Circumference --      Peak Flow --      Pain Score 12/18/17 1724 10     Pain Loc --      Pain Edu? --      Excl. in GC? --    No data found.  Updated Vital Signs BP 100/62 (BP Location: Right Arm)   Pulse 74   Temp 98.2 F (36.8 C) (Oral)   Resp 18   SpO2 98%   Visual  Acuity Right Eye Distance:   Left Eye Distance:   Bilateral Distance:    Right Eye Near:   Left Eye Near:    Bilateral Near:     Physical Exam  Constitutional: She is oriented to person, place, and time. She appears well-developed and well-nourished. No distress.  HENT:  Head: Normocephalic and atraumatic.  Eyes: Pupils are equal, round, and reactive to light. Conjunctivae and EOM are normal.  Neck: Neck supple.  Cardiovascular: Normal rate and regular rhythm.  No murmur heard. Pulmonary/Chest: Effort normal and breath sounds normal. No respiratory distress.  Breathing comfortably at rest, CTABL, no wheezing, rales or other adventitious sounds auscultated  Abdominal: Soft. There is no tenderness.  Musculoskeletal: She exhibits no edema.  Back: Nontender to palpation of cervical and thoracic spine midline, mild tenderness to palpation of lumbar spine midline, increased tenderness to right lumbar region and approximately L to/L3 region.  Negative straight leg raise bilaterally.  Strength 5/5 and equal bilaterally at hips and knees  Left foot: No obvious swelling or bruising, nontender to palpation of medial lateral malleolus, tenderness extending from proximal fourth and fifth metatarsals distally Dorsalis pedis 2+  Left hand: No significant bruising or swelling, tender to palpation through fifth MCP and fifth finger, resistant to bend finger at all joints, nontender at distal radius and ulna, radial pulse 2+, cap refill less than 2 seconds  Neurological: She is alert and oriented to person, place, and time.  Patient A&O x3, cranial nerves II-XII grossly intact, strength at shoulders, hips and knees 5/5, equal bilaterally, patellar reflex 2+ bilaterally.  Antalgic gait.  Skin: Skin is warm and dry.  Psychiatric: She has a normal mood and affect.  Nursing note and vitals reviewed.    UC Treatments / Results  Labs (all labs ordered are listed, but only abnormal results are  displayed) Labs Reviewed - No data to display  EKG None  Radiology Dg Hand Complete Left  Result Date: 12/18/2017 CLINICAL DATA:  Left hand pain post fall. EXAM: LEFT HAND - COMPLETE 3+ VIEW COMPARISON:  None. FINDINGS: There is no evidence of fracture or dislocation. There is no evidence of arthropathy or other focal bone abnormality. Soft tissues are unremarkable. IMPRESSION: Negative. Electronically Signed  By: Ted Mcalpine M.D.   On: 12/18/2017 18:33   Dg Foot Complete Left  Result Date: 12/18/2017 CLINICAL DATA:  Pain post fall. EXAM: LEFT FOOT - COMPLETE 3+ VIEW COMPARISON:  None. FINDINGS: There is no evidence of fracture or dislocation. There is no evidence of arthropathy or other focal bone abnormality. Soft tissues are unremarkable. IMPRESSION: Negative. Electronically Signed   By: Ted Mcalpine M.D.   On: 12/18/2017 18:34    Procedures Procedures (including critical care time)  Medications Ordered in UC Medications - No data to display  Initial Impression / Assessment and Plan / UC Course  I have reviewed the triage vital signs and the nursing notes.  Pertinent labs & imaging results that were available during my care of the patient were reviewed by me and considered in my medical decision making (see chart for details).    No neuro deficits. X-ray of hand and foot negative for acute abnormality or fracture.  Most likely sprain/contusions.  Recommending conservative treatment.  Deferred x-ray of lumbar spine given tenderness largely more lateral, most likely lumbar strain from fall.  Will treat conservatively with Tylenol.  Patient on lithium, cannot take NSAIDs.  Will add in Flexeril to use as needed with Tylenol.  Discussed sedation regarding Flexeril and advised only to use at bedtime.  Do not use while needing to drive or work.  Ice.Discussed strict return precautions. Patient verbalized understanding and is agreeable with plan.  Final Clinical  Impressions(s) / UC Diagnoses   Final diagnoses:  Left hand pain  Left foot pain  Fall, initial encounter     Discharge Instructions     Please take Tylenol [phone] mg every 4-6 hours You may use flexeril as needed to help with pain. This is a muscle relaxer and causes sedation- please use only at bedtime or when you will be home and not have to drive/work  Ice foot and hand  Please follow-up if pain worsening, developing weakness, numbness or tingling, loss of bowel or bladder control, numbness between thighs, not improving over the next 1 to 2 weeks     ED Prescriptions    Medication Sig Dispense Auth. Provider   cyclobenzaprine (FLEXERIL) 10 MG tablet Take 1 tablet (10 mg total) by mouth 2 (two) times daily as needed for muscle spasms. 20 tablet Wieters, [place] C, PA-C     Controlled Substance Prescriptions [place] Controlled Substance Registry consulted? Not Applicable   Lew Dawes, [place] 12/19/17 [phone]

## 2018-01-07 ENCOUNTER — Encounter (HOSPITAL_COMMUNITY): Payer: Self-pay | Admitting: Emergency Medicine

## 2018-01-07 ENCOUNTER — Other Ambulatory Visit: Payer: Self-pay

## 2018-01-07 ENCOUNTER — Ambulatory Visit (HOSPITAL_COMMUNITY)
Admission: EM | Admit: 2018-01-07 | Discharge: 2018-01-07 | Disposition: A | Discharge status: Home / Self Care | Payer: Medicaid Other

## 2018-01-07 DIAGNOSIS — S161XXA Strain of muscle, fascia and tendon at neck level, initial encounter: Secondary | ICD-10-CM

## 2018-01-07 DIAGNOSIS — S39012A Strain of muscle, fascia and tendon of lower back, initial encounter: Secondary | ICD-10-CM

## 2018-01-07 MED ORDER — NAPROXEN 500 MG PO TABS: 500.0000 mg | ORAL_TABLET | Freq: Two times a day (BID) | ORAL | 0 refills | Status: DC
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
## 2018-01-07 NOTE — Discharge Instructions (Signed)
Heat and massage to affected areas, light activity as tolerated, see neck exercises provided.  Naproxen twice a day, take with food to prevent nausea.  May use muscle relaxer as prescribed, don't take if taking your trazodone.  May have soreness for the next few days which can be expected.  If persistent or no improvement in the next 2-3 weeks please follow up with your primary care provider.
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
## 2018-01-16 ENCOUNTER — Encounter (HOSPITAL_COMMUNITY): Payer: Self-pay | Admitting: Emergency Medicine

## 2018-01-16 ENCOUNTER — Other Ambulatory Visit: Payer: Self-pay

## 2018-01-16 ENCOUNTER — Emergency Department (HOSPITAL_COMMUNITY)
Admission: EM | Admit: 2018-01-16 | Discharge: 2018-01-16 | Disposition: A | Discharge status: Home / Self Care | Payer: Medicaid Other | Attending: Emergency Medicine | Admitting: Emergency Medicine

## 2018-01-16 DIAGNOSIS — F17210 Nicotine dependence, cigarettes, uncomplicated: Secondary | ICD-10-CM | POA: Insufficient documentation

## 2018-01-16 DIAGNOSIS — M545 Low back pain, unspecified: Secondary | ICD-10-CM

## 2018-01-16 DIAGNOSIS — M549 Dorsalgia, unspecified: Secondary | ICD-10-CM | POA: Diagnosis present

## 2018-01-16 DIAGNOSIS — Z79899 Other long term (current) drug therapy: Secondary | ICD-10-CM | POA: Insufficient documentation

## 2018-01-16 MED ORDER — NAPROXEN 500 MG PO TABS: 500.0000 mg | ORAL_TABLET | Freq: Two times a day (BID) | ORAL | 0 refills | Status: DC

## 2018-01-16 MED ORDER — CYCLOBENZAPRINE HCL 10 MG PO TABS: 10.0000 mg | ORAL_TABLET | Freq: Two times a day (BID) | ORAL | 0 refills | Status: DC | PRN

## 2018-01-16 NOTE — ED Notes (Signed)
Patient verbalizes understanding of discharge instructions. Opportunity for questioning and answers were provided. Armband removed by staff, pt discharged from ED.  
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
## 2018-04-27 ENCOUNTER — Other Ambulatory Visit: Payer: Self-pay

## 2018-04-27 ENCOUNTER — Encounter (HOSPITAL_COMMUNITY): Payer: Self-pay

## 2018-04-27 ENCOUNTER — Ambulatory Visit (HOSPITAL_COMMUNITY)
Admission: EM | Admit: 2018-04-27 | Discharge: 2018-04-27 | Disposition: A | Discharge status: Home / Self Care | Payer: Medicaid Other | Attending: Family Medicine | Admitting: Family Medicine

## 2018-04-27 DIAGNOSIS — R6889 Other general symptoms and signs: Secondary | ICD-10-CM | POA: Diagnosis not present

## 2018-04-27 MED ORDER — CETIRIZINE-PSEUDOEPHEDRINE ER 5-120 MG PO TB12: 1.0000 | ORAL_TABLET | Freq: Every day | ORAL | 0 refills | Status: DC

## 2018-04-27 MED ORDER — ONDANSETRON HCL 4 MG PO TABS: 4.0000 mg | ORAL_TABLET | Freq: Four times a day (QID) | ORAL | 0 refills | Status: DC

## 2018-04-27 MED ORDER — FLUTICASONE PROPIONATE 50 MCG/ACT NA SUSP
2.0000 | Freq: Every day | NASAL | 0 refills | Status: DC
Start: 2018-04-27 — End: 2018-07-22

## 2018-04-27 MED ORDER — OSELTAMIVIR PHOSPHATE 75 MG PO CAPS: 75.0000 mg | ORAL_CAPSULE | Freq: Two times a day (BID) | ORAL | 0 refills | Status: DC

## 2018-04-27 MED ORDER — BENZONATATE 100 MG PO CAPS: 100.0000 mg | ORAL_CAPSULE | Freq: Three times a day (TID) | ORAL | 0 refills | Status: DC

## 2018-04-27 NOTE — ED Provider Notes (Signed)
[facility]   [id number] 04/27/18 Arrival Time: 1501   CC: flu like symptoms  SUBJECTIVE: History from: patient.  Remington Skalsky is a 33 y.o. female who presents with abrupt onset of runny nose, congestion, cough, slight fever, sore throat, HA, body aches, 2 episodes of diarrhea, and nausea x 1 day.  Admits to sick exposure to son with similar symptoms.  Has tried OTC medications with minimal relief.    Reports previous symptoms in the past and diagnosed with the flu.   Denies sinus pain, SOB, wheezing, chest pain, changes in bladder habits.      Received flu shot this year: no.  ROS: As per HPI.  Past Medical History:  Diagnosis Date  . Anemia   . Bipolar 1 disorder (HCC)   . Depression   . GERD (gastroesophageal reflux disease)   . Tendinitis    Past Surgical History:  Procedure Laterality Date  . CESAREAN SECTION     Allergies  Allergen Reactions  . Ibuprofen Nausea Only  . Penicillins Hives   No current facility-administered medications on file prior to encounter.    Current Outpatient Medications on File Prior to Encounter  Medication Sig Dispense Refill  . hydrOXYzine (ATARAX/VISTARIL) 25 MG tablet Take 25 mg by mouth 3 (three) times daily as needed.    . lithium carbonate (ESKALITH) 450 MG CR tablet Take by mouth 2 (two) times daily.    . risperiDONE (RISPERDAL) 2 MG tablet Take 1 tablet (2 mg total) by mouth at bedtime. 14 tablet 0  . traZODone (DESYREL) 25 mg TABS tablet Take 50 mg by mouth at bedtime.     Social History   Socioeconomic History  . Marital status: Single    Spouse name: Not on file  . Number of children: Not on file  . Years of education: Not on file  . Highest education level: Not on file  Occupational History  . Not on file  Social Needs  . Financial resource strain: Not on file  . Food insecurity:    Worry: Not on file    Inability: Not on file  . Transportation needs:    Medical: Not on file    Non-medical: Not on file    Tobacco Use  . Smoking status: Current Every Day Smoker    Packs/day: 1.00    Years: 15.00    Pack years: 15.00    Types: Cigarettes  . Smokeless tobacco: Never Used  Substance and Sexual Activity  . Alcohol use: Yes    Comment: Occ  . Drug use: No    Types: Marijuana    Comment: no longer using  . Sexual activity: Yes    Birth control/protection: Implant  Lifestyle  . Physical activity:    Days per week: Not on file    Minutes per session: Not on file  . Stress: Not on file  Relationships  . Social connections:    Talks on phone: Not on file    Gets together: Not on file    Attends religious service: Not on file    Active member of club or organization: Not on file    Attends meetings of clubs or organizations: Not on file    Relationship status: Not on file  . Intimate partner violence:    Fear of current or ex partner: Not on file    Emotionally abused: Not on file    Physically abused: Not on file    Forced sexual activity: Not on file  Other Topics Concern  . Not on file  Social History Narrative  . Not on file   Family History  Problem Relation Age of Onset  . Healthy Mother   . Healthy Father     OBJECTIVE:  Vitals:   04/27/18 1519  BP: 102/63  Pulse: 89  Temp: 99.1 F (37.3 C)  TempSrc: Oral  SpO2: 100%  Weight: 195 lb (88.5 kg)     General appearance: alert; appears fatigued, but nontoxic; speaking in full sentences and tolerating own secretions HEENT: NCAT; Ears: EACs clear, TMs pearly gray; Eyes: PERRL.  EOM grossly intact.  Nose: nares patent without rhinorrhea, turbinates erythematous and swollen, Throat: oropharynx clear, tonsils non erythematous or enlarged, uvula midline  Neck: supple without LAD Lungs: unlabored respirations, symmetrical air entry; cough: mild; no respiratory distress; CTAB Heart: regular rate and rhythm.  Radial pulses 2+ symmetrical bilaterally Skin: warm and dry Psychological: alert and cooperative; normal mood and  affect  ASSESSMENT & PLAN:  1. Flu-like symptoms     Meds ordered this encounter  Medications  . oseltamivir (TAMIFLU) 75 MG capsule    Sig: Take 1 capsule (75 mg total) by mouth every 12 (twelve) hours.    Dispense:  10 capsule    Refill:  0    Order Specific Question:   Supervising Provider    Answer:   Eustace Moore [[id number]]  . cetirizine-pseudoephedrine (ZYRTEC-D) 5-120 MG tablet    Sig: Take 1 tablet by mouth daily.    Dispense:  30 tablet    Refill:  0    Order Specific Question:   Supervising Provider    Answer:   Eustace Moore [[id number]]  . benzonatate (TESSALON) 100 MG capsule    Sig: Take 1 capsule (100 mg total) by mouth every 8 (eight) hours.    Dispense:  21 capsule    Refill:  0    Order Specific Question:   Supervising Provider    Answer:   Eustace Moore [[id number]]  . fluticasone (FLONASE) 50 MCG/ACT nasal spray    Sig: Place 2 sprays into both nostrils daily.    Dispense:  16 g    Refill:  0    Order Specific Question:   Supervising Provider    Answer:   Eustace Moore [[id number]]  . ondansetron (ZOFRAN) 4 MG tablet    Sig: Take 1 tablet (4 mg total) by mouth every 6 (six) hours.    Dispense:  12 tablet    Refill:  0    Order Specific Question:   Supervising Provider    Answer:   Eustace Moore [[id number]]   Get plenty of rest and push fluids.  Drink at least half your body weight in ounces.  You may supplement with OTC Pedialyte or oral rehydration solution Tamiflu prescribed.  Take as directed and to completion Tessalon Perles prescribed for cough Flonase and zyrtec D for congestion and runny nose. Use OTC tylenol and/or aleve every 4-6 hours for fever, body aches, and chills Zofran for nausea Follow up with PCP if symptoms persist Go to the ED if you have any new or worsening symptoms fever that does not moderate with tylenol, chills, nausea, vomiting, chest pain, worsening cough, shortness of breath, wheezing, abdominal pain,  changes in bowel or bladder habits, etc...   Reviewed expectations re: course of current medical issues. Questions answered. Outlined signs and symptoms indicating need for more acute intervention. Patient verbalized understanding. After Visit Summary given.  Rennis Harding, PA-C 04/27/18 1629

## 2018-04-27 NOTE — Discharge Instructions (Signed)
Get plenty of rest and push fluids.  Drink at least half your body weight in ounces.  You may supplement with OTC Pedialyte or oral rehydration solution Tamiflu prescribed.  Take as directed and to completion Tessalon Perles prescribed for cough Flonase and zyrtec D for congestion and runny nose. Use OTC tylenol and/or aleve every 4-6 hours for fever, body aches, and chills Follow up with PCP if symptoms persist Go to the ED if you have any new or worsening symptoms fever that does not moderate with tylenol, chills, nausea, vomiting, chest pain, worsening cough, shortness of breath, wheezing, abdominal pain, changes in bowel or bladder habits, etc..Marland Kitchen

## 2018-04-27 NOTE — ED Triage Notes (Signed)
Pt cc headaches, nauseous and fever. X 2 days

## 2018-07-08 DIAGNOSIS — G43109 Migraine with aura, not intractable, without status migrainosus: Secondary | ICD-10-CM | POA: Insufficient documentation

## 2018-07-22 ENCOUNTER — Telehealth: Payer: Self-pay | Admitting: Neurology

## 2018-07-22 NOTE — Telephone Encounter (Signed)
Pt gave consent for video visit.  Pt understands that although there may be some limitations with this type of visit, we will take all precautions to reduce any security or privacy concerns.  Pt understands that this will be treated like an in office visit and we will file with pt's insurance, and there may be a patient responsible charge related to this service. °

## 2018-07-22 NOTE — Telephone Encounter (Signed)
I called pt. Pt's meds, allergies, and PMH were updated.  Pt reports that she was dx with migraines when she was 33 years old. Her migraines have worsened. She has on average one migraine per week but it can last several days. She is sensitive to light and sound and sometimes has associated N/V.  Pt understands the doxy.me process.

## 2018-07-22 NOTE — Addendum Note (Signed)
Addended by: Geronimo Running A on: 07/22/2018 01:57 PM   Modules accepted: Orders

## 2018-07-26 ENCOUNTER — Other Ambulatory Visit: Payer: Self-pay

## 2018-07-26 ENCOUNTER — Ambulatory Visit: Payer: Medicaid Other | Admitting: Neurology

## 2018-07-26 NOTE — Progress Notes (Unsigned)
Star Age, MD, PhD [facility] [address], [address]. [place], [place] [address]   Virtual Visit via Video Note  I connected with@ on 07/26/18 at 10:30 AM EDT by a video enabled telemedicine application and verified that I am speaking with the correct person using two identifiers.   I discussed the limitations of evaluation and management by telemedicine and the availability of in person appointments. The patient expressed understanding and agreed to proceed.  History of Present Illness:     {Desc; his/her:32168} Past Medical History Is Significant For: Past Medical History:  Diagnosis Date  . Anemia   . Bipolar 1 disorder ([place])   . Depression   . GERD (gastroesophageal reflux disease)   . Tendinitis     {Desc; his/her:32168} Past Surgical History Is Significant For: Past Surgical History:  Procedure Laterality Date  . CESAREAN SECTION      {Desc; his/her:32168} Family History Is Significant For: Family History  Problem Relation Age of Onset  . Healthy Mother   . Healthy Father     {Desc; his/her:32168} Social History Is Significant For: Social History   Socioeconomic History  . Marital status: Single    Spouse name: Not on file  . Number of children: Not on file  . Years of education: Not on file  . Highest education level: Not on file  Occupational History  . Not on file  Social Needs  . Financial resource strain: Not on file  . Food insecurity:    Worry: Not on file    Inability: Not on file  . Transportation needs:    Medical: Not on file    Non-medical: Not on file  Tobacco Use  . Smoking status: Current Every Day Smoker    Packs/day: 1.00    Years: 15.00    Pack years: 15.00    Types: Cigarettes  . Smokeless tobacco: Never Used  Substance and Sexual Activity  . Alcohol use: Yes    Comment: Occ  . Drug use: No    Types: Marijuana    Comment: no longer using  . Sexual activity: Yes    Birth  control/protection: Implant  Lifestyle  . Physical activity:    Days per week: Not on file    Minutes per session: Not on file  . Stress: Not on file  Relationships  . Social connections:    Talks on phone: Not on file    Gets together: Not on file    Attends religious service: Not on file    Active member of club or organization: Not on file    Attends meetings of clubs or organizations: Not on file    Relationship status: Not on file  Other Topics Concern  . Not on file  Social History Narrative  . Not on file    {Desc; his/her:32168} Allergies Are:  Allergies  Allergen Reactions  . Ibuprofen Nausea Only  . Penicillins Hives  :   {Desc; his/her:32168} Current Medications Are:  Outpatient Encounter Medications as of 07/26/2018  Medication Sig  . cetirizine-pseudoephedrine (ZYRTEC-D) 5-120 MG tablet Take 1 tablet by mouth daily.  . hydrOXYzine (ATARAX/VISTARIL) 25 MG tablet Take 25 mg by mouth 3 (three) times daily as needed.  . lithium carbonate (ESKALITH) 450 MG CR tablet Take 450 mg by mouth daily.   . naproxen (NAPROSYN) 500 MG tablet Take 500 mg by mouth 2 (two) times daily with a meal.  . risperiDONE (RISPERDAL) 2 MG tablet Take 1  tablet (2 mg total) by mouth at bedtime.  . traZODone (DESYREL) 25 mg TABS tablet Take 50 mg by mouth at bedtime.   No facility-administered encounter medications on file as of 07/26/2018.   :   Review of Systems:  Out of a complete 14 point review of systems, all are reviewed and negative with the exception of these symptoms as listed below:  Observations/Objective:   Assessment and Plan:   Follow Up Instructions:    I discussed the assessment and treatment plan with the patient. The patient was provided an opportunity to ask questions and all were answered. The patient agreed with the plan and demonstrated an understanding of the instructions.   The patient was advised to call back or seek an in-person evaluation if the symptoms  worsen or if the condition fails to improve as anticipated.  I provided *** minutes of non-face-to-face time during this encounter.   Huston FoleySaima Mohamed Portlock, MD

## 2018-07-27 NOTE — Telephone Encounter (Signed)
Called patient to reschedule her 6/8 virtual visit due to technical difficulties. I offered an in office visit but patient still would like to try a virtual visit. I have sent patient an updated doxy e-mail and rescheduled her appointment for 6/16 at 2:30.

## 2018-08-03 ENCOUNTER — Ambulatory Visit (INDEPENDENT_AMBULATORY_CARE_PROVIDER_SITE_OTHER): Payer: Medicaid Other | Admitting: Neurology

## 2018-08-03 ENCOUNTER — Encounter: Payer: Self-pay | Admitting: Neurology

## 2018-08-03 ENCOUNTER — Other Ambulatory Visit: Payer: Self-pay

## 2018-08-03 DIAGNOSIS — G43009 Migraine without aura, not intractable, without status migrainosus: Secondary | ICD-10-CM | POA: Diagnosis not present

## 2018-08-03 DIAGNOSIS — E669 Obesity, unspecified: Secondary | ICD-10-CM | POA: Diagnosis not present

## 2018-08-03 DIAGNOSIS — R0683 Snoring: Secondary | ICD-10-CM

## 2018-08-03 DIAGNOSIS — G4700 Insomnia, unspecified: Secondary | ICD-10-CM

## 2018-08-03 DIAGNOSIS — G479 Sleep disorder, unspecified: Secondary | ICD-10-CM

## 2018-08-03 DIAGNOSIS — R51 Headache: Secondary | ICD-10-CM | POA: Diagnosis not present

## 2018-08-03 DIAGNOSIS — R519 Headache, unspecified: Secondary | ICD-10-CM

## 2018-08-03 DIAGNOSIS — F39 Unspecified mood [affective] disorder: Secondary | ICD-10-CM

## 2018-08-03 MED ORDER — ONDANSETRON HCL 4 MG PO TABS: 4.0000 mg | ORAL_TABLET | Freq: Three times a day (TID) | ORAL | 0 refills | Status: DC | PRN

## 2018-08-03 MED ORDER — UBRELVY 50 MG PO TABS: 50.0000 mg | ORAL_TABLET | Freq: Once | ORAL | 3 refills | Status: DC | PRN

## 2018-08-03 NOTE — Patient Instructions (Signed)
Given verbally, during today's virtual video-based encounter, with verbal feedback received.   

## 2018-08-03 NOTE — Progress Notes (Signed)
Dawn FoleySaima Davanee Klinkner, MD, PhD [facility] [address], [address] [address] [place], [place] [id number]   Virtual Visit via Video Note on 08/03/2018: I connected with Dawn Horton on 08/03/18 at  2:30 PM EDT by a video enabled telemedicine application and verified that I am speaking with the correct person using two identifiers.   I discussed the limitations of evaluation and management by telemedicine and the availability of in person appointments. The patient expressed understanding and agreed to proceed.  History of Present Illness:   Dawn Horton is a 33 year old right-handed woman with an underlying medical history of reflux disease, mood disorder, anemia, and obesity, who presents for a virtual, video based appointment via doxy.me for evaluation of her recurrent headaches, concern for migraine headaches.  The patient is unaccompanied today and joins via cell phone from home, I am located in my office.  Of note, she was not able to join a virtual visit on 07/26/2018.   She is referred by her primary care physician, Dr. Alvina Filbertenise Hunter and I reviewed her virtual office note from 07/08/2018.   Of note, she is on several potentially sedating medications including lithium 450 mg long-acting at night, Risperdal 2 mg at night, trazodone 50 mg at this needed.  She also takes hydroxyzine as needed.  She follows with [facility] and reports a diagnosis of schizophrenia, bipolar disease and PTSD.  She has had significant weight gain in the past 3 years.  Her most recent weight is 193 pounds.  She does snore and has woken up with a headache.  She did not sleep very well.  She has a hard time sleeping but does not take trazodone every night.  She reports a history of migraines since childhood, age 33.  She denies a family history of migraines.  She has one-sided headaches, mostly right-sided but also sometimes left-sided, starts in the periorbital and retro-orbital area and radiates  backwards typically.  She has associated nausea and sometimes vomiting.  She does not typically take anything for nausea.  In the past she may have tried Zofran she recalls, she had a bout of vertigo in the past and tried medicine for that.  She estimates that she has 1 migraine a week currently, or 4 migraine headaches per month.  She has never been on prescription preventative medications.  She has never tried a triptan.  She typically takes over-the-counter Tylenol up to 4 pills in 24 hours in divided doses and/or Aleve, up to 2/day.  She may take 1 or the other on a nearly daily basis because of body aches and tendinitis reported.  She has associated sensitivity to light and sound when she has a migraine.  It helps to use a warm compress and lie down.  She has also taken naproxen 500 mg as needed.  She has never had a sleep study.  She reports that she had an MRI of the brain as a child and also in 2012.  I was not able to pull up any records from her previous MRIs, she reports [facility]  This was in the context of severe headaches and escalation of her mental health problem as I understand.  She has not had an eye examination in over 2 years.  She reports a diagnosis of astigmatism and feels like her vision has become a little worse and that she should get her eyes checked out and she admits. She was seen in the  emergency room on 04/27/2018 for flulike symptoms.  She was treated with Tamiflu and symptomatically for cough and congestion.  Her Past Medical History Is Significant For: Past Medical History:  Diagnosis Date   Anemia    Bipolar 1 disorder (HCC)    Depression    GERD (gastroesophageal reflux disease)    Tendinitis     Her Past Surgical History Is Significant For: Past Surgical History:  Procedure Laterality Date   CESAREAN SECTION      Her Family History Is Significant For: Family History  Problem Relation Age of Onset   Healthy Mother    Healthy  Father     Her Social History Is Significant For: Social History   Socioeconomic History   Marital status: Single    Spouse name: Not on file   Number of children: Not on file   Years of education: Not on file   Highest education level: Not on file  Occupational History   Not on file  Social Needs   Financial resource strain: Not on file   Food insecurity    Worry: Not on file    Inability: Not on file   Transportation needs    Medical: Not on file    Non-medical: Not on file  Tobacco Use   Smoking status: Current Every Day Smoker    Packs/day: 1.00    Years: 15.00    Pack years: 15.00    Types: Cigarettes   Smokeless tobacco: Never Used  Substance and Sexual Activity   Alcohol use: Yes    Comment: Occ   Drug use: No    Types: Marijuana    Comment: no longer using   Sexual activity: Yes    Birth control/protection: Implant  Lifestyle   Physical activity    Days per week: Not on file    Minutes per session: Not on file   Stress: Not on file  Relationships   Social connections    Talks on phone: Not on file    Gets together: Not on file    Attends religious service: Not on file    Active member of club or organization: Not on file    Attends meetings of clubs or organizations: Not on file    Relationship status: Not on file  Other Topics Concern   Not on file  Social History Narrative   Not on file    Her Allergies Are:  Allergies  Allergen Reactions   Ibuprofen Nausea Only   Penicillins Hives  :   Her Current Medications Are:  Outpatient Encounter Medications as of 08/03/2018  Medication Sig   cetirizine-pseudoephedrine (ZYRTEC-D) 5-120 MG tablet Take 1 tablet by mouth daily.   hydrOXYzine (ATARAX/VISTARIL) 25 MG tablet Take 25 mg by mouth 3 (three) times daily as needed.   lithium carbonate (ESKALITH) 450 MG CR tablet Take 450 mg by mouth daily.    naproxen (NAPROSYN) 500 MG tablet Take 500 mg by mouth 2 (two) times daily  with a meal.   risperiDONE (RISPERDAL) 2 MG tablet Take 1 tablet (2 mg total) by mouth at bedtime.   traZODone (DESYREL) 25 mg TABS tablet Take 50 mg by mouth at bedtime.   No facility-administered encounter medications on file as of 08/03/2018.   :   Review of Systems:  Out of a complete 14 point review of systems, all are reviewed and negative with the exception of these symptoms as listed below:  Observations/Objective: On examination, she is pleasant and conversant, in  no acute distress, good comprehension and language skills, she is oriented.  Speech is clear without dysarthria, hypophonia or voice tremor noted.  Face is symmetric with normal facial animation noted.  Extraocular movements well preserved in all directions without nystagmus.  Hearing is grossly intact.  Shoulder height is equal, shoulder shrug normal.  Motor examination reveals normal-appearing muscle bulk.  Romberg is negative.  She has no abnormal involuntary movements.  She stands without difficulty, posture and walking are unremarkable.  Finger-to-nose testing for cerebellar testing shows no dysmetria or intention tremor.  She has no drift, no postural or action tremor.  Assessment and Plan:  In summary, Dawn Horton is a very pleasant 33 y.o.-year old female with an underlying medical history of reflux disease, mood disorder, anemia, and obesity, who presents for a virtual, video based appointment via doxy.me for evaluation of her recurrent headaches. Her history and examination are concerning for migraine without aura.  She has a history of recurrent headaches since she was a child she reports.  She has never actually been on any preventative medication and most typically her prescription abortive medication has been in the form of anti-inflammatory medication.  She takes over-the-counter medication currently.  She is currently reporting a headache frequency of about 4 migraines per month.  She would likely benefit from  abortive treatment but may not need preventative treatment quite yet.  In addition, her history and examination are somewhat concerning for sleep disordered breathing.  I would like to proceed with diagnostic testing in the form of sleep study to rule out sleep apnea and also a brain MRI. In preparation for the brain MRI with contrast I would like to make sure her CMP is unremarkable.  She is advised to come in for the blood work at her convenience during our office hours.We will call her with her test results.  As far as symptomatic treatment I would like for her to try Ubrelvy 50 mg Once at the onset of migraine as needed, she may repeated once after 2 hours.  She was advised that the most common side effect may be nausea and sleepiness.  She is advised not to drive after taking this medication.  For as needed use for nausea, she can try generic Zofran, she has tried it before. I suTry to schedule the brain MRI and sleep study in the interim and also keep her posted as to the test results.  I answered all her questions today and she was in agreement.   Follow Up Instructions:    I discussed the assessment and treatment plan with the patient. The patient was provided an opportunity to ask questions and all were answered. The patient agreed with the plan and demonstrated an understanding of the instructions.   The patient was advised to call back or seek an in-person evaluation if the symptoms worsen or if the condition fails to improve as anticipated.  I provided 30 minutes of non-face-to-face time during this encounter.   Dawn FoleySaima Danecia Underdown, MD

## 2018-08-04 ENCOUNTER — Telehealth: Payer: Self-pay

## 2018-08-04 ENCOUNTER — Telehealth: Payer: Self-pay | Admitting: Neurology

## 2018-08-04 NOTE — Telephone Encounter (Signed)
PA completed via phone with medicaid. Should have determination in 24 hours. Case #: [id number]

## 2018-08-04 NOTE — Telephone Encounter (Signed)
Medicaid order sent to [facility]. They will obtain the auth and reach out to the patient to schedule.  

## 2018-08-05 ENCOUNTER — Telehealth: Payer: Self-pay

## 2018-08-05 NOTE — Telephone Encounter (Signed)
I called [employer]. PA for Roselyn Meier was approved from 08/04/18-07/30/19.   #[id number]  AESLPNP notified.

## 2018-08-05 NOTE — Telephone Encounter (Signed)
I called pt, scheduled her for her 3 mo follow up. Pt verbalized understanding of new appt date and time.

## 2018-08-24 ENCOUNTER — Other Ambulatory Visit: Payer: Medicaid Other

## 2018-09-14 ENCOUNTER — Telehealth: Payer: Self-pay

## 2018-09-14 NOTE — Telephone Encounter (Signed)
We have attempted to call the patient two times to schedule sleep study.  Patient has been unavailable at the phone numbers we have on file and has not returned our calls. If patient calls back we will schedule them for their sleep study.  

## 2018-09-15 ENCOUNTER — Other Ambulatory Visit: Payer: Medicaid Other

## 2018-09-18 ENCOUNTER — Other Ambulatory Visit: Payer: Self-pay

## 2018-09-18 ENCOUNTER — Ambulatory Visit
Admission: RE | Admit: 2018-09-18 | Discharge: 2018-09-18 | Disposition: A | Discharge status: Home / Self Care | Payer: Medicaid Other | Source: Ambulatory Visit | Attending: Neurology | Admitting: Neurology

## 2018-09-18 DIAGNOSIS — R51 Headache: Secondary | ICD-10-CM | POA: Diagnosis not present

## 2018-09-18 DIAGNOSIS — R519 Headache, unspecified: Secondary | ICD-10-CM

## 2018-09-18 MED ORDER — GADOBENATE DIMEGLUMINE 529 MG/ML IV SOLN
18.0000 mL | Freq: Once | INTRAVENOUS | Status: AC | PRN
  Administered 2018-09-18: 15:00:00 18 mL via INTRAVENOUS

## 2018-09-20 ENCOUNTER — Telehealth: Payer: Self-pay

## 2018-09-20 NOTE — Progress Notes (Signed)
Brain MRI w and wo contrast was reported as normal; pls update pt.  Dawn Horton

## 2018-09-20 NOTE — Telephone Encounter (Signed)
I called pt. I advised her of her MRI results. Pt verbalized understanding of results. Pt had no questions at this time but was encouraged to call back if questions arise.

## 2018-09-20 NOTE — Telephone Encounter (Signed)
-----   Message from Star Age, MD sent at 09/20/2018  7:50 AM EDT ----- Brain MRI w and wo contrast was reported as normal; pls update pt.  Michel Bickers

## 2018-11-09 ENCOUNTER — Ambulatory Visit: Payer: Self-pay | Admitting: Neurology

## 2018-11-09 ENCOUNTER — Telehealth: Payer: Self-pay

## 2018-11-09 NOTE — Telephone Encounter (Signed)
Pt did not show for their appt with Dr. Athar today.  

## 2018-11-10 ENCOUNTER — Encounter: Payer: Self-pay | Admitting: Neurology

## 2019-05-25 ENCOUNTER — Encounter (HOSPITAL_COMMUNITY): Payer: Self-pay

## 2019-05-25 ENCOUNTER — Ambulatory Visit (HOSPITAL_COMMUNITY)
Admission: EM | Admit: 2019-05-25 | Discharge: 2019-05-25 | Disposition: A | Discharge status: Home / Self Care | Payer: Medicaid Other | Attending: Family Medicine | Admitting: Family Medicine

## 2019-05-25 ENCOUNTER — Other Ambulatory Visit: Payer: Self-pay

## 2019-05-25 DIAGNOSIS — M76891 Other specified enthesopathies of right lower limb, excluding foot: Secondary | ICD-10-CM

## 2019-05-25 DIAGNOSIS — M25561 Pain in right knee: Secondary | ICD-10-CM | POA: Diagnosis not present

## 2019-05-25 DIAGNOSIS — M76892 Other specified enthesopathies of left lower limb, excluding foot: Secondary | ICD-10-CM

## 2019-05-25 DIAGNOSIS — M25562 Pain in left knee: Secondary | ICD-10-CM

## 2019-05-25 DIAGNOSIS — M24551 Contracture, right hip: Secondary | ICD-10-CM | POA: Diagnosis not present

## 2019-05-25 MED ORDER — PREDNISONE 10 MG (21) PO TBPK: ORAL_TABLET | Freq: Every day | ORAL | 0 refills | Status: AC

## 2019-05-25 MED ORDER — CYCLOBENZAPRINE HCL 10 MG PO TABS: 10.0000 mg | ORAL_TABLET | Freq: Two times a day (BID) | ORAL | 0 refills | Status: DC | PRN

## 2019-05-25 MED ORDER — MELOXICAM 15 MG PO TABS: 15.0000 mg | ORAL_TABLET | Freq: Every day | ORAL | 0 refills | Status: DC

## 2019-05-25 NOTE — ED Triage Notes (Signed)
Pt c/o 10/10 aching, sharp left posterior knee, right anterior knee and right hip started yesterday. Pt denies injury. Pt also c/o dizzinessx1 mo. She states its a side effect of the naproxen and has an appt with her PCP Mon for that.

## 2019-05-25 NOTE — ED Provider Notes (Signed)
[facility]    CSN: [id number] Arrival date & time: 05/25/19  1554      History   Chief Complaint Chief Complaint  Patient presents with  . knee pain bilat, dizzy    HPI Dawn Horton is a 34 y.o. female.   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  States is been going on for about a month.  With the left knee, she has pain posterior knee, and the right knee she has pain at the medial aspect of the joint.  Patient reports history of tendinitis in both knees.  She also reports dizziness after she started taking naproxen for her knee pain about a month ago.  She is also tried to take a warm bath, but reports that she cannot get down in the tub or get back up.  Reports swelling to the right knee and the anterior aspect.  Patient reports that she has a primary care appointment on Monday but did not go to work today so she needs a work note to get her until Monday.  Denies headache, cough, shortness of breath, chills, body aches, nausea, vomiting, diarrhea, rash, fever, other symptoms.  ROS per HPI  The history is provided by the patient.    Past Medical History:  Diagnosis Date  . Anemia   . Bipolar 1 disorder (HCC)   . Depression   . GERD (gastroesophageal reflux disease)   . Tendinitis     Patient Active Problem List   Diagnosis Date Noted  . Bipolar affective disorder, current episode manic with psychotic symptoms (HCC) 05/21/2016    Past Surgical History:  Procedure Laterality Date  . CESAREAN SECTION      OB History    Gravida  3   Para  2   Term  2   Preterm  0   AB  1   Living  2     SAB  1   TAB  0   Ectopic  0   Multiple  0   Live Births  2            Home Medications    Prior to Admission medications   Medication Sig Start Date End Date Taking? Authorizing Provider  cetirizine-pseudoephedrine (ZYRTEC-D) 5-120 MG tablet Take 1 tablet by mouth daily. 04/27/18   Wurst,  [place], PA-C  cyclobenzaprine (FLEXERIL) 10 MG tablet Take 1 tablet (10 mg total) by mouth 2 (two) times daily as needed for muscle spasms. 05/25/19   Moshe Cipro, NP  hydrOXYzine (ATARAX/VISTARIL) 25 MG tablet Take 25 mg by mouth 3 (three) times daily as needed.    [provider]  lithium carbonate (ESKALITH) 450 MG CR tablet Take 450 mg by mouth daily.     [provider]  meloxicam (MOBIC) 15 MG tablet Take 1 tablet (15 mg total) by mouth daily. 05/25/19   Moshe Cipro, NP  naproxen (NAPROSYN) 500 MG tablet Take 500 mg by mouth 2 (two) times daily with a meal.    [provider]  predniSONE (STERAPRED UNI-PAK 21 TAB) 10 MG (21) TBPK tablet Take by mouth daily for 6 days. Take 6 tablets on day 1, 5 tablets on day 2, 4 tablets on day 3, 3 tablets on day 4, 2 tablets on day 5, 1 tablet on day 6 05/25/19 05/31/19  Moshe Cipro, NP  risperiDONE (RISPERDAL) 2 MG tablet Take 1 tablet (2 mg total) by mouth at  bedtime. 05/20/16   Montine Circle, PA-C  traZODone (DESYREL) 25 mg TABS tablet Take 50 mg by mouth at bedtime.    [provider]    Family History Family History  Problem Relation Age of Onset  . Healthy Mother   . Healthy Father     Social History Social History   Tobacco Use  . Smoking status: Current Every Day Smoker    Packs/day: 1.00    Years: 15.00    Pack years: 15.00    Types: Cigarettes  . Smokeless tobacco: Never Used  Substance Use Topics  . Alcohol use: Not Currently    Comment: Occ  . Drug use: Not Currently    Types: Marijuana    Comment: no longer using     Allergies   Ibuprofen and Penicillins   Review of Systems Review of Systems   Physical Exam Triage Vital Signs ED Triage Vitals [05/25/19 1645]  Enc Vitals Group     BP 119/63     Pulse Rate 77     Resp 16     Temp 98.3 F (36.8 C)     Temp Source Oral     SpO2 100 %     Weight 192 lb (87.1 kg)     Height 5\' 4"  (1.626 m)     Head  Circumference      Peak Flow      Pain Score 8     Pain Loc      Pain Edu?      Excl. in [place]?    No data found.  Updated Vital Signs BP 119/63   Pulse 77   Temp 98.3 F (36.8 C) (Oral)   Resp 16   Ht 5\' 4"  (1.626 m)   Wt 192 lb (87.1 kg)   SpO2 100%   BMI 32.96 kg/m   Visual Acuity Right Eye Distance:   Left Eye Distance:   Bilateral Distance:    Right Eye Near:   Left Eye Near:    Bilateral Near:     Physical Exam Vitals and nursing note reviewed.  Constitutional:      General: She is not in acute distress.    Appearance: Normal appearance. She is well-developed. She is obese. She is not ill-appearing.  HENT:     Head: Normocephalic and atraumatic.     Nose: Nose normal.     Mouth/Throat:     Mouth: Mucous membranes are moist.     Pharynx: Oropharynx is clear.  Eyes:     Extraocular Movements: Extraocular movements intact.     Conjunctiva/sclera: Conjunctivae normal.     Pupils: Pupils are equal, round, and reactive to light.  Cardiovascular:     Rate and Rhythm: Normal rate and regular rhythm.     Heart sounds: Normal heart sounds. No murmur.  Pulmonary:     Effort: Pulmonary effort is normal. No respiratory distress.     Breath sounds: Normal breath sounds. No stridor. No wheezing or rhonchi.  Chest:     Chest wall: No tenderness.  Abdominal:     General: Bowel sounds are normal. There is no distension.     Palpations: Abdomen is soft. There is no mass.     Tenderness: There is no abdominal tenderness. There is no right CVA tenderness, left CVA tenderness, guarding or rebound.     Hernia: No hernia is present.  Musculoskeletal:        General: Swelling (Anterior right knee) and tenderness (Posterior left  knee, right hip flexor) present.     Cervical back: Normal range of motion and neck supple.  Skin:    General: Skin is warm and dry.     Capillary Refill: Capillary refill takes less than 2 seconds.  Neurological:     General: No focal deficit  present.     Mental Status: She is alert and oriented to person, place, and time.  Psychiatric:        Mood and Affect: Mood normal.        Behavior: Behavior normal.        Thought Content: Thought content normal.      UC Treatments / Results  Labs (all labs ordered are listed, but only abnormal results are displayed) Labs Reviewed - No data to display  EKG   Radiology No results found.  Procedures Procedures (including critical care time)  Medications Ordered in UC Medications - No data to display  Initial Impression / Assessment and Plan / UC Course  I have reviewed the triage vital signs and the nursing notes.  Pertinent labs & imaging results that were available during my care of the patient were reviewed by me and considered in my medical decision making (see chart for details).     Bilateral knee pain: Likely tendinitis in both knees, also has right hip flexor tightness upon palpation.  Instructed patient that she may try knee sleeves while she is walking on concrete floors at work.  Also instructed that she may want to try compression socks with a very good pair of supportive shoes.  She may also try some stretching to help the tight hip flexor.  Instructed that she may take Tylenol for pain.  She may use topical rubs, heat or ice for comfort.  Prescribed meloxicam 15 mg daily for inflammation also prescribed prednisone taper to help with inflammation.  Also prescribed Flexeril 10 mg twice daily as needed muscle spasms.  Discussed that if she is still having symptoms after this treatment, she can follow-up with orthopedics.  Patient verbalized understanding and agrees to treatment plan. Final Clinical Impressions(s) / UC Diagnoses   Final diagnoses:  Acute pain of both knees  Tendonitis of knee, left  Tendonitis of knee, right  Hip flexor tightness, right     Discharge Instructions     Take the meloxicam for inflammation as prescribed.  Rest and elevate your  legs at night.  Apply ice packs 2-3 times a day for up to 20 minutes each.  Wear the Ace wrap as needed for comfort.    Follow up with your primary care provider or an orthopedist if you symptoms continue or worsen;  Or if you develop new symptoms, such as numbness, tingling, or weakness.    You may also try to use knee sleeves for support and joint stabilization.    ED Prescriptions    Medication Sig Dispense Auth. Provider   cyclobenzaprine (FLEXERIL) 10 MG tablet Take 1 tablet (10 mg total) by mouth 2 (two) times daily as needed for muscle spasms. 20 tablet Moshe Cipro, NP   meloxicam (MOBIC) 15 MG tablet Take 1 tablet (15 mg total) by mouth daily. 30 tablet Moshe Cipro, NP   predniSONE (STERAPRED UNI-PAK 21 TAB) 10 MG (21) TBPK tablet Take by mouth daily for 6 days. Take 6 tablets on day 1, 5 tablets on day 2, 4 tablets on day 3, 3 tablets on day 4, 2 tablets on day 5, 1 tablet on day 6 21 tablet  Moshe Cipro, NP     PDMP not reviewed this encounter.   Moshe Cipro, NP 05/25/19 1815

## 2019-05-25 NOTE — Discharge Instructions (Signed)
Take the meloxicam for inflammation as prescribed.  Rest and elevate your legs at night.  Apply ice packs 2-3 times a day for up to 20 minutes each.  Wear the Ace wrap as needed for comfort.    Follow up with your primary care provider or an orthopedist if you symptoms continue or worsen;  Or if you develop new symptoms, such as numbness, tingling, or weakness.    You may also try to use knee sleeves for support and joint stabilization.

## 2019-06-06 ENCOUNTER — Other Ambulatory Visit: Payer: Medicaid Other

## 2019-09-17 ENCOUNTER — Encounter (HOSPITAL_COMMUNITY): Payer: Self-pay

## 2019-09-17 ENCOUNTER — Ambulatory Visit (HOSPITAL_COMMUNITY)
Admission: EM | Admit: 2019-09-17 | Discharge: 2019-09-17 | Disposition: A | Discharge status: Home / Self Care | Payer: Medicaid Other

## 2019-09-17 ENCOUNTER — Other Ambulatory Visit: Payer: Self-pay

## 2019-09-17 DIAGNOSIS — G8929 Other chronic pain: Secondary | ICD-10-CM | POA: Diagnosis not present

## 2019-09-17 DIAGNOSIS — M25562 Pain in left knee: Secondary | ICD-10-CM

## 2019-09-17 NOTE — Discharge Instructions (Signed)
Continue ice and compression today.   Continue Naproxen  Follow up with Orthopedics

## 2019-09-17 NOTE — ED Provider Notes (Signed)
[facility]    CSN: [id number] Arrival date & time: 09/17/19  1033      History   Chief Complaint Chief Complaint  Patient presents with  . Knee Pain    HPI Dawn Horton is a 34 y.o. female.   Pt complains of exacerbation of chronic left knee pain that started two days ago. Denies injury or trauma.  She reports she works at [employer] and is required to do a lot of standing and walking up stairs.  She filled her prescription for Naproxen that was previously prescribed for knee pain yesterday and has noted some improvement since starting that.  Patient reports history of tendinitis in both knees, seen 05/2019 for similar problem.  She reports she called out of work today due to pain.  Pain is worse with ambulation.      Past Medical History:  Diagnosis Date  . Anemia   . Bipolar 1 disorder (HCC)   . Depression   . GERD (gastroesophageal reflux disease)   . Tendinitis     Patient Active Problem List   Diagnosis Date Noted  . Bipolar affective disorder, current episode manic with psychotic symptoms (HCC) 05/21/2016    Past Surgical History:  Procedure Laterality Date  . CESAREAN SECTION      OB History    Gravida  3   Para  2   Term  2   Preterm  0   AB  1   Living  2     SAB  1   TAB  0   Ectopic  0   Multiple  0   Live Births  2            Home Medications    Prior to Admission medications   Medication Sig Start Date End Date Taking? Authorizing Provider  hydrOXYzine (ATARAX/VISTARIL) 25 MG tablet Take 25 mg by mouth 3 (three) times daily as needed.   Yes [provider]  lithium carbonate (ESKALITH) 450 MG CR tablet Take 450 mg by mouth daily.    Yes [provider]  naproxen (NAPROSYN) 500 MG tablet Take 500 mg by mouth 2 (two) times daily with a meal.   Yes [provider]  risperiDONE (RISPERDAL) 2 MG tablet Take 1 tablet (2 mg total) by mouth at bedtime. 05/20/16  Yes Roxy Horseman, PA-C    cetirizine-pseudoephedrine (ZYRTEC-D) 5-120 MG tablet Take 1 tablet by mouth daily. 04/27/18   Wurst, [place], PA-C  cyclobenzaprine (FLEXERIL) 10 MG tablet Take 1 tablet (10 mg total) by mouth 2 (two) times daily as needed for muscle spasms. 05/25/19   Moshe Cipro, NP  meloxicam (MOBIC) 15 MG tablet Take 1 tablet (15 mg total) by mouth daily. 05/25/19   Moshe Cipro, NP  nicotine (NICODERM CQ - DOSED IN MG/24 HOURS) 21 mg/24hr patch 21 mg daily. 09/10/19   [provider]  traZODone (DESYREL) 25 mg TABS tablet Take 50 mg by mouth at bedtime.    [provider]    Family History Family History  Problem Relation Age of Onset  . Healthy Mother   . Healthy Father     Social History Social History   Tobacco Use  . Smoking status: Current Every Day Smoker    Packs/day: 1.00    Years: 15.00    Pack years: 15.00    Types: Cigarettes  . Smokeless tobacco: Never Used  Substance Use Topics  . Alcohol use: Not Currently    Comment: Occ  .  Drug use: Not Currently    Types: Marijuana    Comment: no longer using     Allergies   Ibuprofen and Penicillins   Review of Systems Review of Systems  Constitutional: Negative for chills and fever.  HENT: Negative for ear pain and sore throat.   Eyes: Negative for pain and visual disturbance.  Respiratory: Negative for cough and shortness of breath.   Cardiovascular: Negative for chest pain and palpitations.  Gastrointestinal: Negative for abdominal pain and vomiting.  Genitourinary: Negative for dysuria and hematuria.  Musculoskeletal: Positive for arthralgias (right knee pain). Negative for back pain.  Skin: Negative for color change, rash and wound.  Neurological: Negative for seizures and syncope.  All other systems reviewed and are negative.    Physical Exam Triage Vital Signs ED Triage Vitals  Enc Vitals Group     BP 09/17/19 1111 107/75     Pulse Rate 09/17/19 1111 81     Resp 09/17/19 1111 16      Temp 09/17/19 1111 98.8 F (37.1 C)     Temp Source 09/17/19 1111 Oral     SpO2 09/17/19 1111 100 %     Weight --      Height --      Head Circumference --      Peak Flow --      Pain Score 09/17/19 1110 9     Pain Loc --      Pain Edu? --      Excl. in [place]? --    No data found.  Updated Vital Signs BP 107/75 (BP Location: Right Arm)   Pulse 81   Temp 98.8 F (37.1 C) (Oral)   Resp 16   SpO2 100%   Visual Acuity Right Eye Distance:   Left Eye Distance:   Bilateral Distance:    Right Eye Near:   Left Eye Near:    Bilateral Near:     Physical Exam Vitals and nursing note reviewed.  Constitutional:      General: She is not in acute distress.    Appearance: She is well-developed.  HENT:     Head: Normocephalic and atraumatic.  Eyes:     Conjunctiva/sclera: Conjunctivae normal.  Cardiovascular:     Rate and Rhythm: Normal rate and regular rhythm.     Heart sounds: No murmur heard.   Pulmonary:     Effort: Pulmonary effort is normal. No respiratory distress.     Breath sounds: Normal breath sounds.  Abdominal:     Palpations: Abdomen is soft.     Tenderness: There is no abdominal tenderness.  Musculoskeletal:     Cervical back: Neck supple.     Right knee: No swelling, deformity, effusion, erythema or crepitus. Normal range of motion. Tenderness present over the medial joint line and lateral joint line. No LCL laxity, MCL laxity, ACL laxity or PCL laxity. Normal alignment. Normal pulse.     Instability Tests: Anterior drawer test negative. Posterior drawer test negative.     Left knee: Normal.  Skin:    General: Skin is warm and dry.  Neurological:     Mental Status: She is alert.      UC Treatments / Results  Labs (all labs ordered are listed, but only abnormal results are displayed) Labs Reviewed - No data to display  EKG   Radiology No results found.  Procedures Procedures (including critical care time)  Medications Ordered in  UC Medications - No data to display  Initial  Impression / Assessment and Plan / UC Course  I have reviewed the triage vital signs and the nursing notes.  Pertinent labs & imaging results that were available during my care of the patient were reviewed by me and considered in my medical decision making (see chart for details).     Exacerbation of chronic knee pain without injury or trauma.  Pt will continue Naproxen, reports improvement since starting yesterday.  Work note given.  She will contact Ortho on Monday.  Recommend ice and rest today.  Discussed use of compression sleeve today and while at work in the future.   Final Clinical Impressions(s) / UC Diagnoses   Final diagnoses:  None   Discharge Instructions   None    ED Prescriptions    None     PDMP not reviewed this encounter.   Jodell Cipro, PA-C 09/17/19 1133

## 2019-09-17 NOTE — ED Triage Notes (Signed)
Pt c/o generalized left knee pain/swelling for past two days. Reports h/o "tendonitis" to left knee. Was given Rx of Naproxen 500mg  refill yesterday and pt reports Rx has helped with pain but here today for persistent pain despite Rx. dose naproxen last taken at 0800 (500mg ) Neurovascular intact to left foot.  No ice therapy used at home. Ice pack applied at [facility].

## 2019-11-08 ENCOUNTER — Encounter (HOSPITAL_COMMUNITY): Payer: Self-pay

## 2019-11-08 ENCOUNTER — Ambulatory Visit (HOSPITAL_COMMUNITY)
Admission: EM | Admit: 2019-11-08 | Discharge: 2019-11-08 | Disposition: A | Discharge status: Home / Self Care | Payer: Medicaid Other | Attending: Family Medicine | Admitting: Family Medicine

## 2019-11-08 ENCOUNTER — Other Ambulatory Visit: Payer: Self-pay

## 2019-11-08 DIAGNOSIS — F17210 Nicotine dependence, cigarettes, uncomplicated: Secondary | ICD-10-CM | POA: Diagnosis not present

## 2019-11-08 DIAGNOSIS — R05 Cough: Secondary | ICD-10-CM | POA: Diagnosis present

## 2019-11-08 DIAGNOSIS — J029 Acute pharyngitis, unspecified: Secondary | ICD-10-CM | POA: Diagnosis not present

## 2019-11-08 DIAGNOSIS — Z79899 Other long term (current) drug therapy: Secondary | ICD-10-CM | POA: Diagnosis not present

## 2019-11-08 DIAGNOSIS — J069 Acute upper respiratory infection, unspecified: Secondary | ICD-10-CM | POA: Insufficient documentation

## 2019-11-08 DIAGNOSIS — Z791 Long term (current) use of non-steroidal anti-inflammatories (NSAID): Secondary | ICD-10-CM | POA: Diagnosis not present

## 2019-11-08 DIAGNOSIS — Z20822 Contact with and (suspected) exposure to covid-19: Secondary | ICD-10-CM | POA: Diagnosis not present

## 2019-11-08 DIAGNOSIS — F319 Bipolar disorder, unspecified: Secondary | ICD-10-CM | POA: Insufficient documentation

## 2019-11-08 LAB — SARS CORONAVIRUS 2 (TAT 6-24 HRS): SARS Coronavirus 2: NEGATIVE

## 2019-11-08 MED ORDER — FLUTICASONE PROPIONATE 50 MCG/ACT NA SUSP: 1.0000 | Freq: Every day | NASAL | 2 refills | Status: DC

## 2019-11-08 MED ORDER — CETIRIZINE HCL 10 MG PO TABS: 10.0000 mg | ORAL_TABLET | Freq: Every day | ORAL | 0 refills | Status: DC

## 2019-11-08 NOTE — ED Triage Notes (Signed)
Pt c/o productive cough w/clear mucous, sore throat, body aches, nasal congestion and drainage, HA started yesterday. Pt denies dysphagia.

## 2019-11-08 NOTE — Discharge Instructions (Signed)
Medicine as prescribed Covid test pending.  Follow up as needed for continued or worsening symptoms

## 2019-11-09 NOTE — ED Provider Notes (Signed)
[facility]    CSN: [id number] Arrival date & time: 11/08/19  1414      History   Chief Complaint Chief Complaint  Patient presents with   Cough    HPI Ksenia Kunz is a 34 y.o. female.   Patient is a 34 year old female who presents today with cough with clear mucus, sore throat, body aches, nasal ingestion and drainage and headache that started yesterday.  Symptoms have been constant.  No fever, chills     Past Medical History:  Diagnosis Date   Anemia    Bipolar 1 disorder (HCC)    Depression    GERD (gastroesophageal reflux disease)    Tendinitis     Patient Active Problem List   Diagnosis Date Noted   Bipolar affective disorder, current episode manic with psychotic symptoms (HCC) 05/21/2016    Past Surgical History:  Procedure Laterality Date   CESAREAN SECTION      OB History    Gravida  3   Para  2   Term  2   Preterm  0   AB  1   Living  2     SAB  1   TAB  0   Ectopic  0   Multiple  0   Live Births  2            Home Medications    Prior to Admission medications   Medication Sig Start Date End Date Taking? Authorizing Provider  cetirizine (ZYRTEC) 10 MG tablet Take 1 tablet (10 mg total) by mouth daily. 11/08/19   Akshat Minehart, Gloris Manchester A, NP  fluticasone (FLONASE) 50 MCG/ACT nasal spray Place 1 spray into both nostrils daily. 11/08/19   Dahlia Byes A, NP  lithium carbonate (ESKALITH) 450 MG CR tablet Take 450 mg by mouth daily.     [provider]  naproxen (NAPROSYN) 500 MG tablet Take 500 mg by mouth 2 (two) times daily with a meal.    [provider]  risperiDONE (RISPERDAL) 2 MG tablet Take 1 tablet (2 mg total) by mouth at bedtime. 05/20/16   Roxy Horseman, PA-C  traZODone (DESYREL) 25 mg TABS tablet Take 50 mg by mouth at bedtime.  11/08/19  [provider]    Family History Family History  Problem Relation Age of Onset   Healthy Mother    Healthy Father     Social  History Social History   Tobacco Use   Smoking status: Current Every Day Smoker    Packs/day: 1.00    Years: 15.00    Pack years: 15.00    Types: Cigarettes   Smokeless tobacco: Never Used  Substance Use Topics   Alcohol use: Not Currently    Comment: Occ   Drug use: Not Currently    Types: Marijuana    Comment: no longer using     Allergies   Ibuprofen and Penicillins   Review of Systems Review of Systems   Physical Exam Triage Vital Signs ED Triage Vitals [11/08/19 1449]  Enc Vitals Group     BP 103/72     Pulse Rate 62     Resp 16     Temp 98.8 F (37.1 C)     Temp Source Oral     SpO2 97 %     Weight 193 lb (87.5 kg)     Height 5' 4.5" (1.638 m)     Head Circumference      Peak Flow      Pain  Score 9     Pain Loc      Pain Edu?      Excl. in [place]?    No data found.  Updated Vital Signs BP 103/72    Pulse 62    Temp 98.8 F (37.1 C) (Oral)    Resp 16    Ht 5' 4.5" (1.638 m)    Wt 193 lb (87.5 kg)    SpO2 97%    BMI 32.62 kg/m   Visual Acuity Right Eye Distance:   Left Eye Distance:   Bilateral Distance:    Right Eye Near:   Left Eye Near:    Bilateral Near:     Physical Exam Vitals and nursing note reviewed.  Constitutional:      General: She is not in acute distress.    Appearance: Normal appearance. She is not ill-appearing, toxic-appearing or diaphoretic.  HENT:     Head: Normocephalic.     Right Ear: Tympanic membrane and ear canal normal.     Left Ear: Tympanic membrane normal.     Nose: Nose normal.     Mouth/Throat:     Pharynx: Oropharynx is clear.  Eyes:     Conjunctiva/sclera: Conjunctivae normal.  Cardiovascular:     Rate and Rhythm: Normal rate and regular rhythm.  Pulmonary:     Effort: Pulmonary effort is normal.     Breath sounds: Normal breath sounds.  Musculoskeletal:        General: Normal range of motion.     Cervical back: Normal range of motion.  Skin:    General: Skin is warm and dry.     Findings: No  rash.  Neurological:     Mental Status: She is alert.  Psychiatric:        Mood and Affect: Mood normal.      UC Treatments / Results  Labs (all labs ordered are listed, but only abnormal results are displayed) Labs Reviewed  SARS CORONAVIRUS 2 (TAT 6-24 HRS)    EKG   Radiology No results found.  Procedures Procedures (including critical care time)  Medications Ordered in UC Medications - No data to display  Initial Impression / Assessment and Plan / UC Course  I have reviewed the triage vital signs and the nursing notes.  Pertinent labs & imaging results that were available during my care of the patient were reviewed by me and considered in my medical decision making (see chart for details).     Viral URI with cough versus allergies. Medicine as prescribed.  Covid test pending. Follow up as needed for continued or worsening symptoms  Final Clinical Impressions(s) / UC Diagnoses   Final diagnoses:  Viral URI with cough     Discharge Instructions     Medicine as prescribed Covid test pending.  Follow up as needed for continued or worsening symptoms     ED Prescriptions    Medication Sig Dispense Auth. Provider   fluticasone (FLONASE) 50 MCG/ACT nasal spray Place 1 spray into both nostrils daily. 16 g Garima Chronis A, NP   cetirizine (ZYRTEC) 10 MG tablet Take 1 tablet (10 mg total) by mouth daily. 30 tablet Dahlia Byes A, NP     PDMP not reviewed this encounter.   Dahlia Byes A, NP 11/09/19 1416

## 2020-01-04 ENCOUNTER — Ambulatory Visit (HOSPITAL_COMMUNITY)
Admission: EM | Admit: 2020-01-04 | Discharge: 2020-01-04 | Disposition: A | Discharge status: Home / Self Care | Payer: Medicaid Other | Attending: Emergency Medicine | Admitting: Emergency Medicine

## 2020-01-04 ENCOUNTER — Other Ambulatory Visit: Payer: Self-pay

## 2020-01-04 ENCOUNTER — Encounter (HOSPITAL_COMMUNITY): Payer: Self-pay | Admitting: Emergency Medicine

## 2020-01-04 DIAGNOSIS — S63501A Unspecified sprain of right wrist, initial encounter: Secondary | ICD-10-CM | POA: Diagnosis not present

## 2020-01-04 MED ORDER — PREDNISONE 10 MG PO TABS: ORAL_TABLET | ORAL | 0 refills | Status: DC

## 2020-01-04 NOTE — ED Triage Notes (Signed)
Pt presents with right hand and wrist pain with swelling xs 2 days. Tylenol gave some relief.

## 2020-01-04 NOTE — Discharge Instructions (Signed)
Wear wrist brace over the next 2 weeks to limit motions with right wrist and allowed to heal May continue with Tylenol Prednisone taper x6 days-begin with 6 tablets, decrease by 1 tablet each day until complete-take with food in the morning if able  Follow-up if not improving or worsening

## 2020-01-05 NOTE — ED Provider Notes (Signed)
[facility]    CSN: [id number] Arrival date & time: 01/04/20  1510      History   Chief Complaint Chief Complaint  Patient presents with  . Hand Pain    HPI Dawn Horton is a 34 y.o. female history of GERD, bipolar type I presenting today for evaluation of right hand and wrist pain.  Reports that over the past 2 days she has had increased pain and swelling around her wrist.  Denies any injury, does report she does a lot of typing and is right-handed.  Feels her job is the cause of her symptoms flaring.  Using Tylenol with some relief.  HPI  Past Medical History:  Diagnosis Date  . Anemia   . Bipolar 1 disorder (HCC)   . Depression   . GERD (gastroesophageal reflux disease)   . Tendinitis     Patient Active Problem List   Diagnosis Date Noted  . Bipolar affective disorder, current episode manic with psychotic symptoms (HCC) 05/21/2016    Past Surgical History:  Procedure Laterality Date  . CESAREAN SECTION      OB History    Gravida  3   Para  2   Term  2   Preterm  0   AB  1   Living  2     SAB  1   TAB  0   Ectopic  0   Multiple  0   Live Births  2            Home Medications    Prior to Admission medications   Medication Sig Start Date End Date Taking? Authorizing Provider  lithium carbonate (ESKALITH) 450 MG CR tablet Take 450 mg by mouth daily.    Yes [provider]  risperiDONE (RISPERDAL) 2 MG tablet Take 1 tablet (2 mg total) by mouth at bedtime. 05/20/16  Yes Roxy Horseman, PA-C  predniSONE (DELTASONE) 10 MG tablet Begin with 6 tabs on day 1, 5 tab on day 2, 4 tab on day 3, 3 tab on day 4, 2 tab on day 5, 1 tab on day 6-take with food 01/04/20   Brandyce Dimario, [employer] C, PA-C  cetirizine (ZYRTEC) 10 MG tablet Take 1 tablet (10 mg total) by mouth daily. 11/08/19 01/04/20  Dahlia Byes A, NP  fluticasone (FLONASE) 50 MCG/ACT nasal spray Place 1 spray into both nostrils daily. 11/08/19 01/04/20  Dahlia Byes A, NP    traZODone (DESYREL) 25 mg TABS tablet Take 50 mg by mouth at bedtime.  11/08/19  [provider]    Family History Family History  Problem Relation Age of Onset  . Healthy Mother   . Healthy Father     Social History Social History   Tobacco Use  . Smoking status: Current Every Day Smoker    Packs/day: 1.00    Years: 15.00    Pack years: 15.00    Types: Cigarettes  . Smokeless tobacco: Never Used  Substance Use Topics  . Alcohol use: Not Currently    Comment: Occ  . Drug use: Not Currently    Types: Marijuana    Comment: no longer using     Allergies   Ibuprofen and Penicillins   Review of Systems Review of Systems  Constitutional: Negative for fatigue and fever.  Eyes: Negative for visual disturbance.  Respiratory: Negative for shortness of breath.   Cardiovascular: Negative for chest pain.  Gastrointestinal: Negative for abdominal pain, nausea and vomiting.  Musculoskeletal: Positive for arthralgias and  joint swelling.  Skin: Negative for color change, rash and wound.  Neurological: Negative for dizziness, weakness, light-headedness and headaches.     Physical Exam Triage Vital Signs ED Triage Vitals  Enc Vitals Group     BP 01/04/20 1545 98/63     Pulse Rate 01/04/20 1545 67     Resp 01/04/20 1545 17     Temp 01/04/20 1545 98 F (36.7 C)     Temp Source 01/04/20 1545 Oral     SpO2 01/04/20 1545 100 %     Weight --      Height --      Head Circumference --      Peak Flow --      Pain Score 01/04/20 1543 9     Pain Loc --      Pain Edu? --      Excl. in GC? --    No data found.  Updated Vital Signs BP 98/63 (BP Location: Right Arm)   Pulse 67   Temp 98 F (36.7 C) (Oral)   Resp 17   SpO2 100%   Visual Acuity Right Eye Distance:   Left Eye Distance:   Bilateral Distance:    Right Eye Near:   Left Eye Near:    Bilateral Near:     Physical Exam Vitals and nursing note reviewed.  Constitutional:      Appearance: She is  well-developed.     Comments: No acute distress  HENT:     Head: Normocephalic and atraumatic.     Nose: Nose normal.  Eyes:     Conjunctiva/sclera: Conjunctivae normal.  Cardiovascular:     Rate and Rhythm: Normal rate.  Pulmonary:     Effort: Pulmonary effort is normal. No respiratory distress.  Abdominal:     General: There is no distension.  Musculoskeletal:        General: Normal range of motion.     Cervical back: Neck supple.     Comments: Right wrist: No obvious swelling deformity or discoloration, tender to palpation to distal radius and ulna, over carpal area as well as along second and third meta carpals, full active range of motion of all 5 fingers with interphalangeal joint and MCP joints, radial pulse 2+, sensation intact distally, negative Finkelstein's, negative Tinel's  Skin:    General: Skin is warm and dry.  Neurological:     Mental Status: She is alert and oriented to person, place, and time.      UC Treatments / Results  Labs (all labs ordered are listed, but only abnormal results are displayed) Labs Reviewed - No data to display  EKG   Radiology No results found.  Procedures Procedures (including critical care time)  Medications Ordered in UC Medications - No data to display  Initial Impression / Assessment and Plan / UC Course  I have reviewed the triage vital signs and the nursing notes.  Pertinent labs & imaging results that were available during my care of the patient were reviewed by me and considered in my medical decision making (see chart for details).     Suspect likely wrist sprain/overuse injury, placing in wrist brace and recommending continued anti-inflammatories, is on lithium, will avoid NSAIDs and place on prednisone course x6 days.  Ice elevate and rest.  Discussed strict return precautions. Patient verbalized understanding and is agreeable with plan.  Final Clinical Impressions(s) / UC Diagnoses   Final diagnoses:  Sprain  of right wrist, initial encounter  Discharge Instructions     Wear wrist brace over the next 2 weeks to limit motions with right wrist and allowed to heal May continue with Tylenol Prednisone taper x6 days-begin with 6 tablets, decrease by 1 tablet each day until complete-take with food in the morning if able  Follow-up if not improving or worsening   ED Prescriptions    Medication Sig Dispense Auth. Provider   predniSONE (DELTASONE) 10 MG tablet Begin with 6 tabs on day 1, 5 tab on day 2, 4 tab on day 3, 3 tab on day 4, 2 tab on day 5, 1 tab on day 6-take with food 21 tablet Earlie Arciga C, PA-C     PDMP not reviewed this encounter.   Lew Dawes, PA-C 01/05/20 1006

## 2020-01-17 ENCOUNTER — Ambulatory Visit (HOSPITAL_COMMUNITY)
Admission: EM | Admit: 2020-01-17 | Discharge: 2020-01-17 | Disposition: A | Discharge status: Home / Self Care | Payer: Medicaid Other | Attending: Family Medicine | Admitting: Family Medicine

## 2020-01-17 ENCOUNTER — Other Ambulatory Visit: Payer: Self-pay

## 2020-01-17 ENCOUNTER — Encounter (HOSPITAL_COMMUNITY): Payer: Self-pay | Admitting: *Deleted

## 2020-01-17 DIAGNOSIS — H1031 Unspecified acute conjunctivitis, right eye: Secondary | ICD-10-CM

## 2020-01-17 DIAGNOSIS — H5789 Other specified disorders of eye and adnexa: Secondary | ICD-10-CM

## 2020-01-17 MED ORDER — CETIRIZINE HCL 10 MG PO TABS: 10.0000 mg | ORAL_TABLET | Freq: Every day | ORAL | 2 refills | Status: DC

## 2020-01-17 MED ORDER — POLYMYXIN B-TRIMETHOPRIM 10000-0.1 UNIT/ML-% OP SOLN: 1.0000 [drp] | OPHTHALMIC | 0 refills | Status: DC

## 2020-01-17 NOTE — ED Provider Notes (Signed)
[facility]   [id number] 01/17/20 Arrival Time: 1700  CC: EYE REDNESS  SUBJECTIVE:  Dawn Horton is a 34 y.o. female who presents with complaint of eye redness that began this morning. Denies a precipitating event, trauma, or close contacts with similar symptoms. Has not tried OTC medications for this. There are no aggravating or alleviating factors. Reports that the R eye is watering and had yellow discharge this morning. Denies similar symptoms in the past. Denies fever, chills, nausea, vomiting, eye pain, painful eye movements, halos, discharge, itching, vision changes, double vision..  Denies contact lens use.    ROS: As per HPI.  All other pertinent ROS negative.     Past Medical History:  Diagnosis Date  . Anemia   . Bipolar 1 disorder (HCC)   . Depression   . GERD (gastroesophageal reflux disease)   . Tendinitis    Past Surgical History:  Procedure Laterality Date  . CESAREAN SECTION     Allergies  Allergen Reactions  . Ibuprofen Nausea Only  . Penicillins Hives   No current facility-administered medications on file prior to encounter.   Current Outpatient Medications on File Prior to Encounter  Medication Sig Dispense Refill  . lithium carbonate (ESKALITH) 450 MG CR tablet Take 450 mg by mouth daily.     . predniSONE (DELTASONE) 10 MG tablet Begin with 6 tabs on day 1, 5 tab on day 2, 4 tab on day 3, 3 tab on day 4, 2 tab on day 5, 1 tab on day 6-take with food 21 tablet 0  . risperiDONE (RISPERDAL) 2 MG tablet Take 1 tablet (2 mg total) by mouth at bedtime. 14 tablet 0  . [DISCONTINUED] fluticasone (FLONASE) 50 MCG/ACT nasal spray Place 1 spray into both nostrils daily. 16 g 2  . [DISCONTINUED] traZODone (DESYREL) 25 mg TABS tablet Take 50 mg by mouth at bedtime.     Social History   Socioeconomic History  . Marital status: Single    Spouse name: Not on file  . Number of children: Not on file  . Years of education: Not on file  . Highest education  level: Not on file  Occupational History  . Not on file  Tobacco Use  . Smoking status: Current Every Day Smoker    Packs/day: 1.00    Years: 15.00    Pack years: 15.00    Types: Cigarettes  . Smokeless tobacco: Never Used  Substance and Sexual Activity  . Alcohol use: Not Currently    Comment: Occ  . Drug use: Not Currently    Types: Marijuana    Comment: no longer using  . Sexual activity: Yes    Birth control/protection: Implant  Other Topics Concern  . Not on file  Social History Narrative  . Not on file   Social Determinants of Health   Financial Resource Strain:   . Difficulty of Paying Living Expenses: Not on file  Food Insecurity:   . Worried About Programme researcher, broadcasting/film/video in the Last Year: Not on file  . Ran Out of Food in the Last Year: Not on file  Transportation Needs:   . Lack of Transportation (Medical): Not on file  . Lack of Transportation (Non-Medical): Not on file  Physical Activity:   . Days of Exercise per Week: Not on file  . Minutes of Exercise per Session: Not on file  Stress:   . Feeling of Stress : Not on file  Social Connections:   . Frequency of  Communication with Friends and Family: Not on file  . Frequency of Social Gatherings with Friends and Family: Not on file  . Attends Religious Services: Not on file  . Active Member of Clubs or Organizations: Not on file  . Attends Banker Meetings: Not on file  . Marital Status: Not on file  Intimate Partner Violence:   . Fear of Current or Ex-Partner: Not on file  . Emotionally Abused: Not on file  . Physically Abused: Not on file  . Sexually Abused: Not on file   Family History  Problem Relation Age of Onset  . Healthy Mother   . Healthy Father     OBJECTIVE:       Vitals:   01/17/20 1827 01/17/20 1830  BP: 110/69   Pulse: 72   Resp: 16   Temp: 98.1 F (36.7 C)   TempSrc: Oral   SpO2: 100% 100%  Weight:  191 lb (86.6 kg)  Height:  5' 4.5" (1.638 m)    General  appearance: alert; no distress Eyes: R conjunctival erythema, . PERRL; EOMI without discomfort;  no obvious drainage; lid everted without obvious FB Neck: supple Lungs: clear to auscultation bilaterally Heart: regular rate and rhythm Skin: warm and dry Psychological: alert and cooperative; normal mood and affect   ASSESSMENT & PLAN:  1. Acute bacterial conjunctivitis of right eye   2. Eye irritation     Meds ordered this encounter  Medications  . trimethoprim-polymyxin b (POLYTRIM) ophthalmic solution    Sig: Place 1 drop into both eyes every 4 (four) hours.    Dispense:  10 mL    Refill:  0    Order Specific Question:   Supervising Provider    Answer:   Merrilee Jansky [id number]  . cetirizine (ZYRTEC) 10 MG tablet    Sig: Take 1 tablet (10 mg total) by mouth daily.    Dispense:  30 tablet    Refill:  2    Order Specific Question:   Supervising Provider    Answer:   Merrilee Jansky [[id number]]     Conjunctivitis Use eye drops as prescribed and to completion Dispose of old contacts and wear glasses until you have finished course of antibiotic eye drops Wash pillow cases, wash hands regularly with soap and water, avoid touching your face and eyes, wash door handles, light switches, remotes and other objects you frequently touch Return or follow up with PCP if symptoms persists such as fever, chills, redness, swelling, eye pain, painful eye movements, vision changes  Return or go to ER if you have any new or worsening symptoms such as fever, chills, redness, swelling, eye pain, painful eye movements, vision changes.  Reviewed expectations re: course of current medical issues. Questions answered. Outlined signs and symptoms indicating need for more acute intervention. Patient verbalized understanding. After Visit Summary given.   Moshe Cipro, NP 01/18/20 [phone]

## 2020-01-17 NOTE — ED Triage Notes (Signed)
Pt reports eye irritation started today . Upper and lower lids are puffy and Pt reported some drainage this morning.

## 2020-01-17 NOTE — Discharge Instructions (Signed)
I have sent in zyrtec for you to take daily  I have also sent in polytrim for you to use one drop to the affected eye every 4 hours while awake  Follow up with this office or with primary care if symptoms are persisting.  Follow up in the ER for high fever, trouble swallowing, trouble breathing, other concerning symptoms.

## 2020-02-03 ENCOUNTER — Other Ambulatory Visit: Payer: Self-pay

## 2020-02-03 ENCOUNTER — Ambulatory Visit (HOSPITAL_COMMUNITY)
Admission: RE | Admit: 2020-02-03 | Discharge: 2020-02-03 | Disposition: A | Discharge status: Home / Self Care | Payer: Medicaid Other | Source: Ambulatory Visit

## 2020-02-03 ENCOUNTER — Encounter (HOSPITAL_COMMUNITY): Payer: Self-pay

## 2020-02-03 VITALS — BP 115/84 | HR 96 | Temp 99.0°F | Resp 20

## 2020-02-03 DIAGNOSIS — L239 Allergic contact dermatitis, unspecified cause: Secondary | ICD-10-CM

## 2020-02-03 NOTE — ED Triage Notes (Signed)
Pt in with c/o eye swelling, redness and watering that has been going on for about 2 weeks after she had false eye lashes applied  Pt was prescribed eye drops from pcp yesterday

## 2020-02-03 NOTE — ED Provider Notes (Signed)
[facility]    CSN: [id number] Arrival date & time: 02/03/20  1049      History   Chief Complaint Chief Complaint  Patient presents with  . Facial Swelling  . eye irritation    HPI Dawn Horton is a 34 y.o. female.   Here today with almost 2 weeks of eyelid swelling and irritation b/l after applying false lashes. States this happened to her in the past and she forgot and re-applied the same type. Has been applying warm compresses and her son's triamcinolone cream with some benefit, also takes antihistamines daily for allergies. Saw PCP virtually yesterday and was given pataday drops as well. Does feel the rash has improved significantly but wanted to be seen in person to make sure her eyes were ok. Denies vision loss, significant drainage or swelling, headache, fever.      Past Medical History:  Diagnosis Date  . Anemia   . Bipolar 1 disorder (HCC)   . Depression   . GERD (gastroesophageal reflux disease)   . Tendinitis     Patient Active Problem List   Diagnosis Date Noted  . Bipolar affective disorder, current episode manic with psychotic symptoms (HCC) 05/21/2016    Past Surgical History:  Procedure Laterality Date  . CESAREAN SECTION      OB History    Gravida  3   Para  2   Term  2   Preterm  0   AB  1   Living  2     SAB  1   IAB  0   Ectopic  0   Multiple  0   Live Births  2            Home Medications    Prior to Admission medications   Medication Sig Start Date End Date Taking? Authorizing Provider  cetirizine (ZYRTEC) 10 MG tablet Take 1 tablet (10 mg total) by mouth daily. 01/17/20   Moshe Cipro, NP  lithium carbonate (ESKALITH) 450 MG CR tablet Take 450 mg by mouth daily.     [provider]  predniSONE (DELTASONE) 10 MG tablet Begin with 6 tabs on day 1, 5 tab on day 2, 4 tab on day 3, 3 tab on day 4, 2 tab on day 5, 1 tab on day 6-take with food 01/04/20   Wieters, [employer] C, PA-C  risperiDONE  (RISPERDAL) 2 MG tablet Take 1 tablet (2 mg total) by mouth at bedtime. 05/20/16   Roxy Horseman, PA-C  trimethoprim-polymyxin b (POLYTRIM) ophthalmic solution Place 1 drop into both eyes every 4 (four) hours. 01/17/20   Moshe Cipro, NP  fluticasone (FLONASE) 50 MCG/ACT nasal spray Place 1 spray into both nostrils daily. 11/08/19 01/04/20  Dahlia Byes A, NP  traZODone (DESYREL) 25 mg TABS tablet Take 50 mg by mouth at bedtime.  11/08/19  [provider]    Family History Family History  Problem Relation Age of Onset  . Healthy Mother   . Healthy Father     Social History Social History   Tobacco Use  . Smoking status: Current Every Day Smoker    Packs/day: 1.00    Years: 15.00    Pack years: 15.00    Types: Cigarettes  . Smokeless tobacco: Never Used  Substance Use Topics  . Alcohol use: Not Currently    Comment: Occ  . Drug use: Not Currently    Types: Marijuana    Comment: no longer using     Allergies  Ibuprofen and Penicillins   Review of Systems Review of Systems PER HPI    Physical Exam Triage Vital Signs ED Triage Vitals  Enc Vitals Group     BP 02/03/20 1106 115/84     Pulse Rate 02/03/20 1106 96     Resp 02/03/20 1106 20     Temp 02/03/20 1106 99 F (37.2 C)     Temp Source 02/03/20 1106 Oral     SpO2 02/03/20 1106 98 %     Weight --      Height --      Head Circumference --      Peak Flow --      Pain Score 02/03/20 1104 0     Pain Loc --      Pain Edu? --      Excl. in GC? --    No data found.  Updated Vital Signs BP 115/84 (BP Location: Right Arm)   Pulse 96   Temp 99 F (37.2 C) (Oral)   Resp 20   SpO2 98%   Visual Acuity Right Eye Distance:   Left Eye Distance:   Bilateral Distance:    Right Eye Near:   Left Eye Near:    Bilateral Near:     Physical Exam Vitals and nursing note reviewed.  Constitutional:      Appearance: Normal appearance. She is not ill-appearing.  HENT:     Head: Atraumatic.      Nose: Nose normal.     Mouth/Throat:     Mouth: Mucous membranes are moist.     Pharynx: Oropharynx is clear.  Eyes:     Extraocular Movements: Extraocular movements intact.     Pupils: Pupils are equal, round, and reactive to light.     Comments: Conjunctiva mildly erythematous b/l No evidence of foreign body b/l, eyelids mildly erythematous and edematous b/l  Cardiovascular:     Rate and Rhythm: Normal rate and regular rhythm.     Heart sounds: Normal heart sounds.  Pulmonary:     Effort: Pulmonary effort is normal.     Breath sounds: Normal breath sounds.  Abdominal:     General: Bowel sounds are normal. There is no distension.     Palpations: Abdomen is soft.     Tenderness: There is no abdominal tenderness. There is no guarding.  Musculoskeletal:        General: Normal range of motion.     Cervical back: Normal range of motion and neck supple.  Skin:    General: Skin is warm and dry.  Neurological:     Mental Status: She is alert and oriented to person, place, and time.  Psychiatric:        Mood and Affect: Mood normal.        Thought Content: Thought content normal.        Judgment: Judgment normal.      UC Treatments / Results  Labs (all labs ordered are listed, but only abnormal results are displayed) Labs Reviewed - No data to display  EKG   Radiology No results found.  Procedures Procedures (including critical care time)  Medications Ordered in UC Medications - No data to display  Initial Impression / Assessment and Plan / UC Course  I have reviewed the triage vital signs and the nursing notes.  Pertinent labs & imaging results that were available during my care of the patient were reviewed by me and considered in my medical decision making (see chart for details).  Consistent with allergic dermatitis, which appears to be resolving well. Cold compresses, hydrocortisone cream diluted with moisturizer for use around eyes, and continued eyedrops.  F/u if sxs worsening or not resolving.   Final Clinical Impressions(s) / UC Diagnoses   Final diagnoses:  Allergic dermatitis   Discharge Instructions   None    ED Prescriptions    None     PDMP not reviewed this encounter.   Particia Nearing, [place] 02/03/20 1354

## 2020-02-28 ENCOUNTER — Ambulatory Visit (HOSPITAL_COMMUNITY)
Admission: EM | Admit: 2020-02-28 | Discharge: 2020-02-28 | Disposition: A | Discharge status: Home / Self Care | Payer: Medicaid Other | Attending: Family Medicine | Admitting: Family Medicine

## 2020-02-28 ENCOUNTER — Encounter (HOSPITAL_COMMUNITY): Payer: Self-pay | Admitting: Emergency Medicine

## 2020-02-28 ENCOUNTER — Other Ambulatory Visit: Payer: Self-pay

## 2020-02-28 DIAGNOSIS — M25512 Pain in left shoulder: Secondary | ICD-10-CM

## 2020-02-28 DIAGNOSIS — M25511 Pain in right shoulder: Secondary | ICD-10-CM

## 2020-02-28 MED ORDER — CYCLOBENZAPRINE HCL 5 MG PO TABS: 5.0000 mg | ORAL_TABLET | Freq: Three times a day (TID) | ORAL | 0 refills | Status: DC | PRN

## 2020-02-28 NOTE — ED Triage Notes (Signed)
Pt here for constant shoulder pain onset 2 days... reports pain is aching, throbbing, sharp  [place] 500mg  of Naproxen today w/relief  Denies inj/trauma.... 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

## 2020-02-28 NOTE — ED Provider Notes (Signed)
[facility]    CSN: [id number] Arrival date & time: 02/28/20  1533      History   Chief Complaint Chief Complaint  Patient presents with  . Shoulder Pain    HPI Dawn Horton is a 35 y.o. female.   Here today with b/l shoulder soreness and stiffness for 5 days now. States the sxs started after lifting heavy packages at work Friday and worsen when she accidentally sleeps on her sides or lifts things. Denies numbness, tingling, swelling, discoloration, weakness. Trying naproxen with some relief.      Past Medical History:  Diagnosis Date  . Anemia   . Bipolar 1 disorder (HCC)   . Depression   . GERD (gastroesophageal reflux disease)   . Tendinitis     Patient Active Problem List   Diagnosis Date Noted  . Bipolar affective disorder, current episode manic with psychotic symptoms (HCC) 05/21/2016    Past Surgical History:  Procedure Laterality Date  . CESAREAN SECTION      OB History    Gravida  3   Para  2   Term  2   Preterm  0   AB  1   Living  2     SAB  1   IAB  0   Ectopic  0   Multiple  0   Live Births  2            Home Medications    Prior to Admission medications   Medication Sig Start Date End Date Taking? Authorizing Provider  cyclobenzaprine (FLEXERIL) 5 MG tablet Take 1 tablet (5 mg total) by mouth 3 (three) times daily as needed for muscle spasms. DO NOT DRINK ALCOHOL OR DRIVE WHILE TAKING THIS MEDICATION 02/28/20  Yes Particia Nearing, PA-C  cetirizine (ZYRTEC) 10 MG tablet Take 1 tablet (10 mg total) by mouth daily. 01/17/20   Moshe Cipro, NP  lithium carbonate (ESKALITH) 450 MG CR tablet Take 450 mg by mouth daily.     [provider]  predniSONE (DELTASONE) 10 MG tablet Begin with 6 tabs on day 1, 5 tab on day 2, 4 tab on day 3, 3 tab on day 4, 2 tab on day 5, 1 tab on day 6-take with food 01/04/20   Wieters, [employer] C, PA-C  risperiDONE (RISPERDAL) 2 MG tablet Take 1 tablet (2 mg total) by  mouth at bedtime. 05/20/16   Roxy Horseman, PA-C  trimethoprim-polymyxin b (POLYTRIM) ophthalmic solution Place 1 drop into both eyes every 4 (four) hours. 01/17/20   Moshe Cipro, NP  fluticasone (FLONASE) 50 MCG/ACT nasal spray Place 1 spray into both nostrils daily. 11/08/19 01/04/20  Dahlia Byes A, NP  traZODone (DESYREL) 25 mg TABS tablet Take 50 mg by mouth at bedtime.  11/08/19  [provider]    Family History Family History  Problem Relation Age of Onset  . Healthy Mother   . Healthy Father     Social History Social History   Tobacco Use  . Smoking status: Current Every Day Smoker    Packs/day: 1.00    Years: 15.00    Pack years: 15.00    Types: Cigarettes  . Smokeless tobacco: Never Used  Substance Use Topics  . Alcohol use: Not Currently    Comment: Occ  . Drug use: Not Currently    Types: Marijuana    Comment: no longer using     Allergies   Abilify [aripiprazole], Ibuprofen, and Penicillins   Review of  Systems Review of Systems PER HPI   Physical Exam Triage Vital Signs ED Triage Vitals  Enc Vitals Group     BP 02/28/20 1617 112/74     Pulse Rate 02/28/20 1617 73     Resp 02/28/20 1617 16     Temp 02/28/20 1617 98.2 F (36.8 C)     Temp Source 02/28/20 1617 Oral     SpO2 02/28/20 1617 98 %     Weight --      Height --      Head Circumference --      Peak Flow --      Pain Score 02/28/20 1619 7     Pain Loc --      Pain Edu? --      Excl. in GC? --    No data found.  Updated Vital Signs BP 112/74 (BP Location: Left Arm)   Pulse 73   Temp 98.2 F (36.8 C) (Oral)   Resp 16   SpO2 98%   Visual Acuity Right Eye Distance:   Left Eye Distance:   Bilateral Distance:    Right Eye Near:   Left Eye Near:    Bilateral Near:     Physical Exam Vitals and nursing note reviewed.  Constitutional:      Appearance: Normal appearance. She is not ill-appearing.  HENT:     Head: Atraumatic.  Eyes:     Extraocular Movements:  Extraocular movements intact.     Conjunctiva/sclera: Conjunctivae normal.  Cardiovascular:     Rate and Rhythm: Normal rate and regular rhythm.     Heart sounds: Normal heart sounds.  Pulmonary:     Effort: Pulmonary effort is normal.     Breath sounds: Normal breath sounds.  Abdominal:     General: Bowel sounds are normal. There is no distension.     Palpations: Abdomen is soft.     Tenderness: There is no abdominal tenderness. There is no guarding.  Musculoskeletal:        General: Tenderness (ttp b/l deltoid muscles) present. No swelling or deformity. Normal range of motion.     Cervical back: Normal range of motion and neck supple.     Comments: Grip strength full and equal b/l UEs  Skin:    General: Skin is warm and dry.  Neurological:     Mental Status: She is alert and oriented to person, place, and time.     Comments: B/l UEs neurovascularly intact  Psychiatric:        Mood and Affect: Mood normal.        Thought Content: Thought content normal.        Judgment: Judgment normal.      UC Treatments / Results  Labs (all labs ordered are listed, but only abnormal results are displayed) Labs Reviewed - No data to display  EKG   Radiology No results found.  Procedures Procedures (including critical care time)  Medications Ordered in UC Medications - No data to display  Initial Impression / Assessment and Plan / UC Course  I have reviewed the triage vital signs and the nursing notes.  Pertinent labs & imaging results that were available during my care of the patient were reviewed by me and considered in my medical decision making (see chart for details).     Will treat with continued naproxen, flexeril, stretches, heat massage. Work note given, return if worsening.   Final Clinical Impressions(s) / UC Diagnoses   Final diagnoses:  Acute  pain of both shoulders   Discharge Instructions   None    ED Prescriptions    Medication Sig Dispense Auth.  Provider   cyclobenzaprine (FLEXERIL) 5 MG tablet Take 1 tablet (5 mg total) by mouth 3 (three) times daily as needed for muscle spasms. DO NOT DRINK ALCOHOL OR DRIVE WHILE TAKING THIS MEDICATION 15 tablet Particia Nearing, [place]     PDMP not reviewed this encounter.   Particia Nearing, [place] 02/28/20 1704

## 2020-03-23 ENCOUNTER — Other Ambulatory Visit: Payer: Self-pay

## 2020-03-23 ENCOUNTER — Encounter (HOSPITAL_COMMUNITY): Payer: Self-pay | Admitting: Emergency Medicine

## 2020-03-23 ENCOUNTER — Ambulatory Visit (HOSPITAL_COMMUNITY)
Admission: EM | Admit: 2020-03-23 | Discharge: 2020-03-23 | Disposition: A | Discharge status: Home / Self Care | Payer: Medicaid Other | Attending: Internal Medicine | Admitting: Internal Medicine

## 2020-03-23 DIAGNOSIS — Z1152 Encounter for screening for COVID-19: Secondary | ICD-10-CM | POA: Insufficient documentation

## 2020-03-23 DIAGNOSIS — H5789 Other specified disorders of eye and adnexa: Secondary | ICD-10-CM | POA: Diagnosis present

## 2020-03-23 DIAGNOSIS — J3489 Other specified disorders of nose and nasal sinuses: Secondary | ICD-10-CM | POA: Diagnosis not present

## 2020-03-23 MED ORDER — FLUTICASONE PROPIONATE 50 MCG/ACT NA SUSP: 1.0000 | Freq: Every day | NASAL | 2 refills | Status: DC

## 2020-03-23 MED ORDER — CETIRIZINE HCL 10 MG PO TABS: 10.0000 mg | ORAL_TABLET | Freq: Every day | ORAL | 2 refills | Status: DC

## 2020-03-23 NOTE — ED Provider Notes (Signed)
[facility]    CSN: [id number] Arrival date & time: 03/23/20  1610      History   Chief Complaint Chief Complaint  Patient presents with  . Nasal Congestion    cough  . Cough  . Neck Pain    HPI Dawn Horton is a 35 y.o. female.   Dawn Horton presents with complaints of sinus pressure, bilateral ear pressure r>L, headache and pain behind eyes, and some cough. Started two days ago. No fevers. No gi symptoms. Neck is sore. No other body aches. Has been taking naproxen which has only mildly helped. History of migraines. She has used zyrtec in the past for similar which has been helpful but she ran out. No known ill contacts. No history of covid-19 and has not received vaccination.   ROS per HPI, negative if not otherwise mentioned.      Past Medical History:  Diagnosis Date  . Anemia   . Bipolar 1 disorder (HCC)   . Depression   . GERD (gastroesophageal reflux disease)   . Tendinitis     Patient Active Problem List   Diagnosis Date Noted  . Bipolar affective disorder, current episode manic with psychotic symptoms (HCC) 05/21/2016    Past Surgical History:  Procedure Laterality Date  . CESAREAN SECTION      OB History    Gravida  3   Para  2   Term  2   Preterm  0   AB  1   Living  2     SAB  1   IAB  0   Ectopic  0   Multiple  0   Live Births  2            Home Medications    Prior to Admission medications   Medication Sig Start Date End Date Taking? Authorizing Provider  fluticasone (FLONASE) 50 MCG/ACT nasal spray Place 1 spray into both nostrils daily. 03/23/20  Yes Linus Mako B, NP  lithium carbonate (ESKALITH) 450 MG CR tablet Take 450 mg by mouth daily.    Yes [provider]  risperiDONE (RISPERDAL) 2 MG tablet Take 1 tablet (2 mg total) by mouth at bedtime. 05/20/16  Yes Roxy Horseman, PA-C  cetirizine (ZYRTEC) 10 MG tablet Take 1 tablet (10 mg total) by mouth daily. 03/23/20   Georgetta Haber, NP   cyclobenzaprine (FLEXERIL) 5 MG tablet Take 1 tablet (5 mg total) by mouth 3 (three) times daily as needed for muscle spasms. DO NOT DRINK ALCOHOL OR DRIVE WHILE TAKING THIS MEDICATION 02/28/20   Particia Nearing, PA-C  predniSONE (DELTASONE) 10 MG tablet Begin with 6 tabs on day 1, 5 tab on day 2, 4 tab on day 3, 3 tab on day 4, 2 tab on day 5, 1 tab on day 6-take with food 01/04/20   Wieters, [employer], PA-C  trimethoprim-polymyxin b (POLYTRIM) ophthalmic solution Place 1 drop into both eyes every 4 (four) hours. 01/17/20   Moshe Cipro, NP  traZODone (DESYREL) 25 mg TABS tablet Take 50 mg by mouth at bedtime.  11/08/19  [provider]    Family History Family History  Problem Relation Age of Onset  . Healthy Mother   . Healthy Father     Social History Social History   Tobacco Use  . Smoking status: Current Every Day Smoker    Packs/day: 1.00    Years: 15.00    Pack years: 15.00    Types: Cigarettes  .  Smokeless tobacco: Never Used  Substance Use Topics  . Alcohol use: Not Currently    Comment: Occ  . Drug use: Not Currently    Types: Marijuana    Comment: no longer using     Allergies   Abilify [aripiprazole], Ibuprofen, and Penicillins   Review of Systems Review of Systems   Physical Exam Triage Vital Signs ED Triage Vitals  Enc Vitals Group     BP 03/23/20 1630 97/64     Pulse Rate 03/23/20 1630 73     Resp 03/23/20 1630 16     Temp 03/23/20 1630 98.1 F (36.7 C)     Temp Source 03/23/20 1630 Oral     SpO2 03/23/20 1630 99 %     Weight --      Height --      Head Circumference --      Peak Flow --      Pain Score 03/23/20 1626 4     Pain Loc --      Pain Edu? --      Excl. in GC? --    No data found.  Updated Vital Signs BP 97/64 (BP Location: Left Arm)   Pulse 73   Temp 98.1 F (36.7 C) (Oral)   Resp 16   SpO2 99%   Visual Acuity Right Eye Distance:   Left Eye Distance:   Bilateral Distance:    Right Eye Near:    Left Eye Near:    Bilateral Near:     Physical Exam Constitutional:      General: She is not in acute distress.    Appearance: She is well-developed.  HENT:     Right Ear: Tympanic membrane and ear canal normal.     Left Ear: Tympanic membrane and ear canal normal.     Ears:     Comments: Small effusion to right tm noted without redness or bulging.  Cardiovascular:     Rate and Rhythm: Normal rate.  Pulmonary:     Effort: Pulmonary effort is normal.  Skin:    General: Skin is warm and dry.  Neurological:     Mental Status: She is alert and oriented to person, place, and time.      UC Treatments / Results  Labs (all labs ordered are listed, but only abnormal results are displayed) Labs Reviewed  SARS CORONAVIRUS 2 (TAT 6-24 HRS)    EKG   Radiology No results found.  Procedures Procedures (including critical care time)  Medications Ordered in UC Medications - No data to display  Initial Impression / Assessment and Plan / UC Course  I have reviewed the triage vital signs and the nursing notes.  Pertinent labs & imaging results that were available during my care of the patient were reviewed by me and considered in my medical decision making (see chart for details).     Non toxic. Benign physical exam.  Covid testing pending and isolation instructions provided.  Supportive cares recommended. Zyrtec and flonase recommended. Decadron offered today and patient declined. Return precautions provided. Patient verbalized understanding and agreeable to plan.   Final Clinical Impressions(s) / UC Diagnoses   Final diagnoses:  Encounter for screening for COVID-19  Sinus pressure     Discharge Instructions     Self isolate until covid results are back.  We will notify you by phone if it is positive. Your negative results will be sent through your MyChart.    If it is positive you need to  isolate from others for a total of 5 days. If no fever for 24 hours without  medications, and symptoms improving you may end isolation on day 6, but wear a mask if around any others for an additional 5 days.   Push fluids to ensure adequate hydration and keep secretions thin.  Tylenol and/or ibuprofen as needed for pain or fevers.  Daily flonase and daily zyrtec likely will be helpful.  If symptoms worsen or do not improve in the next week to return to be seen or to follow up with your PCP.     ED Prescriptions    Medication Sig Dispense Auth. Provider   cetirizine (ZYRTEC) 10 MG tablet Take 1 tablet (10 mg total) by mouth daily. 30 tablet Linus Mako B, NP   fluticasone (FLONASE) 50 MCG/ACT nasal spray Place 1 spray into both nostrils daily. 16 g Georgetta Haber, NP     PDMP not reviewed this encounter.   Georgetta Haber, NP 03/23/20 1645

## 2020-03-23 NOTE — Discharge Instructions (Addendum)
Self isolate until covid results are back.  We will notify you by phone if it is positive. Your negative results will be sent through your MyChart.    If it is positive you need to isolate from others for a total of 5 days. If no fever for 24 hours without medications, and symptoms improving you may end isolation on day 6, but wear a mask if around any others for an additional 5 days.   Push fluids to ensure adequate hydration and keep secretions thin.  Tylenol and/or ibuprofen as needed for pain or fevers.  Daily flonase and daily zyrtec likely will be helpful.  If symptoms worsen or do not improve in the next week to return to be seen or to follow up with your PCP.

## 2020-03-23 NOTE — ED Triage Notes (Signed)
Pt presents today with c/o of nasal congestion, cough and neck pain x 2-3 days. Denies fever.

## 2020-03-24 LAB — SARS CORONAVIRUS 2 (TAT 6-24 HRS): SARS Coronavirus 2: NEGATIVE

## 2020-07-03 ENCOUNTER — Emergency Department (HOSPITAL_COMMUNITY)
Admission: EM | Admit: 2020-07-03 | Discharge: 2020-07-03 | Discharge status: Against Medical Advice | Payer: Medicaid Other

## 2020-07-03 NOTE — ED Notes (Signed)
Left before triage. Reported by registration.

## 2020-07-04 ENCOUNTER — Ambulatory Visit (HOSPITAL_COMMUNITY)
Admission: EM | Admit: 2020-07-04 | Discharge: 2020-07-04 | Disposition: A | Discharge status: Home / Self Care | Payer: Medicaid Other | Attending: Family Medicine | Admitting: Family Medicine

## 2020-07-04 ENCOUNTER — Encounter (HOSPITAL_COMMUNITY): Payer: Self-pay

## 2020-07-04 DIAGNOSIS — R202 Paresthesia of skin: Secondary | ICD-10-CM | POA: Diagnosis not present

## 2020-07-04 LAB — CBG MONITORING, ED: Glucose-Capillary: 78 mg/dL (ref 70–99)

## 2020-07-04 NOTE — Discharge Instructions (Addendum)
Keep your follow up appointment with your primary provider.

## 2020-07-04 NOTE — ED Triage Notes (Signed)
Pt in with c/o numbness from the waist down and tingling in her hands that she states has been going on for years. Pt states she was told she may have fibromyalgia  Pt also c/o new onset left eye blurriness   Denies any CP or sob

## 2020-07-04 NOTE — ED Provider Notes (Signed)
[facility]   [id number] 07/04/20 Arrival Time: 1201  ASSESSMENT & PLAN:  1. Paresthesia    Unclear etiology. Reassured that her current symptoms to not represent any life-threatening condition. Declines lab work here; has appt with PCP in the next several days. Will need Lithium level checked. CBG WNL.  Reviewed expectations re: course of current medical issues. Questions answered. Outlined signs and symptoms indicating need for more acute intervention. Understanding verbalized. After Visit Summary given.   SUBJECTIVE: History from: patient. Dawn Horton is a 35 y.o. female who reports intermittent bilateral LE "tingling feeling"; first noted several weeks ago; no pattern to symptoms. Normal bowel/bladder habits. Taking medications as prescribed. Normal appetite and PO intake. No LE edema. Denies street drug use. No heavy alcohol use.  Social History   Tobacco Use  Smoking Status Current Every Day Smoker  . Packs/day: 1.00  . Years: 15.00  . Pack years: 15.00  . Types: Cigarettes  Smokeless Tobacco Never Used   OBJECTIVE:  Vitals:   07/04/20 1321  BP: 129/74  Pulse: 63  Resp: 18  Temp: 98.4 F (36.9 C)  SpO2: 100%    General appearance: alert; no distress Eyes: PERRLA; EOMI; conjunctiva normal HENT: [place]; AT Neck: supple  Lungs: speaks full sentences without difficulty; unlabored Extremities: no edema Skin: warm and dry Neurologic: normal gait; CN2-12 grossly intact; normal movement, strength, and sensation of all extremities Psychological: alert and cooperative; normal mood and affect  Labs: Results for orders placed or performed during the hospital encounter of 07/04/20  POC CBG monitoring  Result Value Ref Range   Glucose-Capillary 78 70 - 99 mg/dL   Labs Reviewed  CBG MONITORING, ED    Allergies  Allergen Reactions  . Abilify [Aripiprazole] Other (See Comments)    Makes pt delirious and forget things.   . Ibuprofen Nausea Only  .  Penicillins Hives    Past Medical History:  Diagnosis Date  . Anemia   . Bipolar 1 disorder (HCC)   . Depression   . GERD (gastroesophageal reflux disease)   . Tendinitis    Social History   Socioeconomic History  . Marital status: Single    Spouse name: Not on file  . Number of children: Not on file  . Years of education: Not on file  . Highest education level: Not on file  Occupational History  . Not on file  Tobacco Use  . Smoking status: Current Every Day Smoker    Packs/day: 1.00    Years: 15.00    Pack years: 15.00    Types: Cigarettes  . Smokeless tobacco: Never Used  Substance and Sexual Activity  . Alcohol use: Not Currently    Comment: Occ  . Drug use: Not Currently    Types: Marijuana    Comment: no longer using  . Sexual activity: Yes    Birth control/protection: Implant  Other Topics Concern  . Not on file  Social History Narrative  . Not on file   Social Determinants of Health   Financial Resource Strain: Not on file  Food Insecurity: Not on file  Transportation Needs: Not on file  Physical Activity: Not on file  Stress: Not on file  Social Connections: Not on file  Intimate Partner Violence: Not on file   Family History  Problem Relation Age of Onset  . Healthy Mother   . Healthy Father    Past Surgical History:  Procedure Laterality Date  . CESAREAN SECTION  Mardella Layman, MD 07/04/20 1415

## 2020-07-08 ENCOUNTER — Ambulatory Visit (HOSPITAL_COMMUNITY)
Admission: EM | Admit: 2020-07-08 | Discharge: 2020-07-08 | Disposition: A | Discharge status: Home / Self Care | Payer: Medicaid Other

## 2020-07-09 DIAGNOSIS — R200 Anesthesia of skin: Secondary | ICD-10-CM | POA: Insufficient documentation

## 2020-07-09 DIAGNOSIS — J309 Allergic rhinitis, unspecified: Secondary | ICD-10-CM | POA: Insufficient documentation

## 2020-07-09 DIAGNOSIS — H538 Other visual disturbances: Secondary | ICD-10-CM | POA: Insufficient documentation

## 2020-07-13 ENCOUNTER — Other Ambulatory Visit: Payer: Self-pay

## 2020-07-13 ENCOUNTER — Ambulatory Visit (HOSPITAL_COMMUNITY)
Admission: EM | Admit: 2020-07-13 | Discharge: 2020-07-13 | Disposition: A | Discharge status: Home / Self Care | Payer: Medicaid Other | Attending: Family Medicine | Admitting: Family Medicine

## 2020-07-13 ENCOUNTER — Encounter (HOSPITAL_COMMUNITY): Payer: Self-pay

## 2020-07-13 DIAGNOSIS — J3089 Other allergic rhinitis: Secondary | ICD-10-CM | POA: Diagnosis not present

## 2020-07-13 DIAGNOSIS — J0101 Acute recurrent maxillary sinusitis: Secondary | ICD-10-CM

## 2020-07-13 MED ORDER — PREDNISONE 20 MG PO TABS: 40.0000 mg | ORAL_TABLET | Freq: Every day | ORAL | 0 refills | Status: DC

## 2020-07-13 MED ORDER — AZITHROMYCIN 250 MG PO TABS: ORAL_TABLET | ORAL | 0 refills | Status: DC

## 2020-07-13 NOTE — Discharge Instructions (Signed)
Try the NeilMed sinus rinse system and Mucinex anytime you feel like your sinuses are becoming congested

## 2020-07-13 NOTE — ED Triage Notes (Signed)
Pt in with c/o headache and congestion and facial pressure around her eyes x 2 weeks  Pt states her left eye is blurry  Pt has been using flonase and zyrtec with no relief

## 2020-07-13 NOTE — ED Provider Notes (Signed)
[facility]    CSN: [id number] Arrival date & time: 07/13/20  1550      History   Chief Complaint Chief Complaint  Patient presents with  . Headache  . Nasal Congestion  . Blurred Vision    HPI Dawn Horton is a 35 y.o. female.   Patient presenting today with 2-week history of acutely worsening congestion, left-sided facial pain and pressure, left-sided periorbital swelling, sinus headache, low-grade fevers.  Denies cough, sore throat, chest pain, shortness of breath, abdominal pain, nausea vomiting or diarrhea.  History of seasonal allergies with recurrent sinus infections.  Taking Flonase and Zyrtec daily without relief.     Past Medical History:  Diagnosis Date  . Anemia   . Bipolar 1 disorder (HCC)   . Depression   . GERD (gastroesophageal reflux disease)   . Tendinitis     Patient Active Problem List   Diagnosis Date Noted  . Bipolar affective disorder, current episode manic with psychotic symptoms (HCC) 05/21/2016    Past Surgical History:  Procedure Laterality Date  . CESAREAN SECTION      OB History    Gravida  3   Para  2   Term  2   Preterm  0   AB  1   Living  2     SAB  1   IAB  0   Ectopic  0   Multiple  0   Live Births  2            Home Medications    Prior to Admission medications   Medication Sig Start Date End Date Taking? Authorizing Provider  azithromycin (ZITHROMAX) 250 MG tablet Take 2 tabs day one, then 1 tab daily until complete 07/13/20  Yes Particia Nearing, PA-C  predniSONE (DELTASONE) 20 MG tablet Take 2 tablets (40 mg total) by mouth daily with breakfast. 07/13/20  Yes Particia Nearing, PA-C  cetirizine (ZYRTEC) 10 MG tablet Take 1 tablet (10 mg total) by mouth daily. 03/23/20   Georgetta Haber, NP  cyclobenzaprine (FLEXERIL) 5 MG tablet Take 1 tablet (5 mg total) by mouth 3 (three) times daily as needed for muscle spasms. DO NOT DRINK ALCOHOL OR DRIVE WHILE TAKING THIS MEDICATION 02/28/20    Particia Nearing, PA-C  fluticasone [facility]) 50 MCG/ACT nasal spray Place 1 spray into both nostrils daily. 03/23/20   Georgetta Haber, NP  lithium carbonate (ESKALITH) 450 MG CR tablet Take 450 mg by mouth daily.     [provider]  predniSONE (DELTASONE) 10 MG tablet Begin with 6 tabs on day 1, 5 tab on day 2, 4 tab on day 3, 3 tab on day 4, 2 tab on day 5, 1 tab on day 6-take with food 01/04/20   Wieters, [employer], PA-C  risperiDONE (RISPERDAL) 2 MG tablet Take 1 tablet (2 mg total) by mouth at bedtime. 05/20/16   Roxy Horseman, PA-C  trimethoprim-polymyxin b (POLYTRIM) ophthalmic solution Place 1 drop into both eyes every 4 (four) hours. 01/17/20   Moshe Cipro, NP  traZODone (DESYREL) 25 mg TABS tablet Take 50 mg by mouth at bedtime.  11/08/19  [provider]    Family History Family History  Problem Relation Age of Onset  . Healthy Mother   . Healthy Father     Social History Social History   Tobacco Use  . Smoking status: Current Every Day Smoker    Packs/day: 1.00    Years: 15.00  Pack years: 15.00    Types: Cigarettes  . Smokeless tobacco: Never Used  Substance Use Topics  . Alcohol use: Not Currently    Comment: Occ  . Drug use: Not Currently    Types: Marijuana    Comment: no longer using     Allergies   Abilify [aripiprazole], Ibuprofen, and Penicillins   Review of Systems Review of Systems Per HPI  Physical Exam Triage Vital Signs ED Triage Vitals  Enc Vitals Group     BP 07/13/20 1659 (!) 121/91     Pulse Rate 07/13/20 1657 78     Resp 07/13/20 1657 17     Temp 07/13/20 1657 99.4 F (37.4 C)     Temp Source 07/13/20 1657 Oral     SpO2 07/13/20 1657 100 %     Weight --      Height --      Head Circumference --      Peak Flow --      Pain Score 07/13/20 1655 9     Pain Loc --      Pain Edu? --      Excl. in GC? --    No data found.  Updated Vital Signs BP (!) 121/91   Pulse 78   Temp 99.4 F (37.4 C)  (Oral)   Resp 17   SpO2 100%   Visual Acuity Right Eye Distance:   Left Eye Distance:   Bilateral Distance:    Right Eye Near:   Left Eye Near:    Bilateral Near:     Physical Exam Vitals and nursing note reviewed.  Constitutional:      Appearance: Normal appearance. She is not ill-appearing.  HENT:     Head: Atraumatic.     Comments: Left maxillary sinus significantly tender to palpation and facial swelling in this area    Right Ear: Tympanic membrane normal.     Left Ear: Tympanic membrane normal.     Nose: Congestion present.     Mouth/Throat:     Mouth: Mucous membranes are moist.     Pharynx: Posterior oropharyngeal erythema present. No oropharyngeal exudate.  Eyes:     Extraocular Movements: Extraocular movements intact.     Conjunctiva/sclera: Conjunctivae normal.  Cardiovascular:     Rate and Rhythm: Normal rate and regular rhythm.     Heart sounds: Normal heart sounds.  Pulmonary:     Effort: Pulmonary effort is normal. No respiratory distress.     Breath sounds: Normal breath sounds. No wheezing or rales.  Musculoskeletal:        General: Normal range of motion.     Cervical back: Normal range of motion and neck supple.  Skin:    General: Skin is warm and dry.  Neurological:     Mental Status: She is alert and oriented to person, place, and time.  Psychiatric:        Mood and Affect: Mood normal.        Thought Content: Thought content normal.        Judgment: Judgment normal.      UC Treatments / Results  Labs (all labs ordered are listed, but only abnormal results are displayed) Labs Reviewed - No data to display  EKG   Radiology No results found.  Procedures Procedures (including critical care time)  Medications Ordered in UC Medications - No data to display  Initial Impression / Assessment and Plan / UC Course  I have reviewed the triage vital signs  and the nursing notes.  Pertinent labs & imaging results that were available during  my care of the patient were reviewed by me and considered in my medical decision making (see chart for details).     Significant sinusitis, will treat with azithromycin given penicillin allergy, prednisone burst, Mucinex, sinus rinses.  Discussed return precautions for acutely worsening symptoms.  Continue allergy regimen with Zyrtec and Flonase.  Final Clinical Impressions(s) / UC Diagnoses   Final diagnoses:  Acute recurrent maxillary sinusitis  Seasonal allergic rhinitis due to other allergic trigger     Discharge Instructions     Try the [employer]Med sinus rinse system and Mucinex anytime you feel like your sinuses are becoming congested     ED Prescriptions    Medication Sig Dispense Auth. Provider   azithromycin (ZITHROMAX) 250 MG tablet Take 2 tabs day one, then 1 tab daily until complete 6 tablet Particia Nearing, PA-C   predniSONE (DELTASONE) 20 MG tablet Take 2 tablets (40 mg total) by mouth daily with breakfast. 10 tablet Particia Nearing, [place]     PDMP not reviewed this encounter.   Particia Nearing, [place] 07/13/20 [phone]

## 2020-07-17 DIAGNOSIS — H1010 Acute atopic conjunctivitis, unspecified eye: Secondary | ICD-10-CM | POA: Insufficient documentation

## 2020-07-17 DIAGNOSIS — H43392 Other vitreous opacities, left eye: Secondary | ICD-10-CM | POA: Insufficient documentation

## 2020-07-17 DIAGNOSIS — J329 Chronic sinusitis, unspecified: Secondary | ICD-10-CM | POA: Insufficient documentation

## 2020-07-31 ENCOUNTER — Other Ambulatory Visit: Payer: Self-pay | Admitting: Ophthalmology

## 2020-07-31 ENCOUNTER — Ambulatory Visit
Admission: RE | Admit: 2020-07-31 | Discharge: 2020-07-31 | Disposition: A | Discharge status: Home / Self Care | Payer: Medicaid Other | Source: Ambulatory Visit | Attending: Ophthalmology | Admitting: Ophthalmology

## 2020-07-31 DIAGNOSIS — H3090 Unspecified chorioretinal inflammation, unspecified eye: Secondary | ICD-10-CM

## 2020-07-31 DIAGNOSIS — M797 Fibromyalgia: Secondary | ICD-10-CM | POA: Insufficient documentation

## 2020-07-31 DIAGNOSIS — H3093 Unspecified chorioretinal inflammation, bilateral: Secondary | ICD-10-CM

## 2020-07-31 DIAGNOSIS — H35063 Retinal vasculitis, bilateral: Secondary | ICD-10-CM

## 2020-07-31 DIAGNOSIS — H5712 Ocular pain, left eye: Secondary | ICD-10-CM | POA: Insufficient documentation

## 2020-07-31 DIAGNOSIS — H35069 Retinal vasculitis, unspecified eye: Secondary | ICD-10-CM | POA: Insufficient documentation

## 2020-08-13 DIAGNOSIS — J343 Hypertrophy of nasal turbinates: Secondary | ICD-10-CM | POA: Insufficient documentation

## 2020-08-13 DIAGNOSIS — J310 Chronic rhinitis: Secondary | ICD-10-CM | POA: Insufficient documentation

## 2020-08-15 ENCOUNTER — Institutional Professional Consult (permissible substitution): Payer: Medicaid Other | Admitting: Neurology

## 2020-08-16 ENCOUNTER — Encounter (HOSPITAL_COMMUNITY): Payer: Self-pay

## 2020-08-16 ENCOUNTER — Ambulatory Visit (HOSPITAL_COMMUNITY)
Admission: EM | Admit: 2020-08-16 | Discharge: 2020-08-16 | Disposition: A | Discharge status: Home / Self Care | Payer: Medicaid Other | Attending: Emergency Medicine | Admitting: Emergency Medicine

## 2020-08-16 ENCOUNTER — Other Ambulatory Visit: Payer: Self-pay

## 2020-08-16 DIAGNOSIS — J029 Acute pharyngitis, unspecified: Secondary | ICD-10-CM

## 2020-08-16 DIAGNOSIS — K219 Gastro-esophageal reflux disease without esophagitis: Secondary | ICD-10-CM | POA: Diagnosis not present

## 2020-08-16 MED ORDER — LIDOCAINE VISCOUS HCL 2 % MT SOLN
15.0000 mL | Freq: Once | OROMUCOSAL | Status: AC
  Administered 2020-08-16: 18:00:00 15 mL via ORAL

## 2020-08-16 MED ORDER — LIDOCAINE VISCOUS HCL 2 % MT SOLN
OROMUCOSAL | Status: AC
  Filled 2020-08-16: qty 15

## 2020-08-16 MED ORDER — ALUM & MAG HYDROXIDE-SIMETH 200-200-20 MG/5ML PO SUSP
ORAL | Status: AC
  Filled 2020-08-16: qty 30

## 2020-08-16 MED ORDER — ALUM & MAG HYDROXIDE-SIMETH 200-200-20 MG/5ML PO SUSP: 15.0000 mL | Freq: Four times a day (QID) | ORAL | 0 refills | Status: DC | PRN

## 2020-08-16 MED ORDER — LIDOCAINE VISCOUS HCL 2 % MT SOLN: 15.0000 mL | Freq: Four times a day (QID) | OROMUCOSAL | 0 refills | Status: DC | PRN

## 2020-08-16 MED ORDER — ALUM & MAG HYDROXIDE-SIMETH 200-200-20 MG/5ML PO SUSP
30.0000 mL | Freq: Once | ORAL | Status: AC
  Administered 2020-08-16: 18:00:00 30 mL via ORAL

## 2020-08-16 NOTE — ED Provider Notes (Signed)
[facility]    CSN: [id number] Arrival date & time: 08/16/20  1636      History   Chief Complaint Chief Complaint  Patient presents with   Sore Throat   Allergic Reaction    HPI Dawn Horton is a 35 y.o. female.   Patient here for evaluation of swelling throat and acid reflux for the past several days.  Reports being recently evaluated for possible allergic reaction to lash extensions.  Reports being prescribed prednisone but states having a sore throat since starting prednisone.  Reports difficulty swallowing and loss of taste.  Patient managing secretions at this time.  Denies any trauma, injury, or other precipitating event.  Denies any fevers, chest pain, shortness of breath, N/V/D, numbness, tingling, weakness, abdominal pain, or headaches.    The history is provided by the patient.  Sore Throat  Allergic Reaction Presenting symptoms: difficulty swallowing    Past Medical History:  Diagnosis Date   Anemia    Bipolar 1 disorder (HCC)    Depression    GERD (gastroesophageal reflux disease)    Tendinitis     Patient Active Problem List   Diagnosis Date Noted   Bipolar affective disorder, current episode manic with psychotic symptoms (HCC) 05/21/2016    Past Surgical History:  Procedure Laterality Date   CESAREAN SECTION      OB History     Gravida  3   Para  2   Term  2   Preterm  0   AB  1   Living  2      SAB  1   IAB  0   Ectopic  0   Multiple  0   Live Births  2            Home Medications    Prior to Admission medications   Medication Sig Start Date End Date Taking? Authorizing Provider  alum & mag hydroxide-simeth (MYLANTA) 200-200-20 MG/5ML suspension Take 15 mLs by mouth every 6 (six) hours as needed for indigestion or heartburn. 08/16/20  Yes Ivette Loyal, NP  lidocaine (XYLOCAINE) 2 % solution Use as directed 15 mLs in the mouth or throat every 6 (six) hours as needed for mouth pain. 08/16/20  Yes Ivette Loyal, NP  azithromycin (ZITHROMAX) 250 MG tablet Take 2 tabs day one, then 1 tab daily until complete 07/13/20   Particia Nearing, PA-C  cetirizine (ZYRTEC) 10 MG tablet Take 1 tablet (10 mg total) by mouth daily. 03/23/20   Georgetta Haber, NP  cyclobenzaprine (FLEXERIL) 5 MG tablet Take 1 tablet (5 mg total) by mouth 3 (three) times daily as needed for muscle spasms. DO NOT DRINK ALCOHOL OR DRIVE WHILE TAKING THIS MEDICATION 02/28/20   Particia Nearing, PA-C  fluticasone [facility]) 50 MCG/ACT nasal spray Place 1 spray into both nostrils daily. 03/23/20   Georgetta Haber, NP  lithium carbonate (ESKALITH) 450 MG CR tablet Take 450 mg by mouth daily.     [provider]  predniSONE (DELTASONE) 10 MG tablet Begin with 6 tabs on day 1, 5 tab on day 2, 4 tab on day 3, 3 tab on day 4, 2 tab on day 5, 1 tab on day 6-take with food 01/04/20   Wieters, [employer], PA-C  predniSONE (DELTASONE) 20 MG tablet Take 2 tablets (40 mg total) by mouth daily with breakfast. 07/13/20   Particia Nearing, PA-C  risperiDONE (RISPERDAL) 2 MG tablet Take 1 tablet (  2 mg total) by mouth at bedtime. 05/20/16   Roxy Horseman, PA-C  trimethoprim-polymyxin b (POLYTRIM) ophthalmic solution Place 1 drop into both eyes every 4 (four) hours. 01/17/20   Moshe Cipro, NP  traZODone (DESYREL) 25 mg TABS tablet Take 50 mg by mouth at bedtime.  11/08/19  [provider]    Family History Family History  Problem Relation Age of Onset   Healthy Mother    Healthy Father     Social History Social History   Tobacco Use   Smoking status: Every Day    Packs/day: 1.00    Years: 15.00    Pack years: 15.00    Types: Cigarettes   Smokeless tobacco: Never  Substance Use Topics   Alcohol use: Not Currently    Comment: Occ   Drug use: Not Currently    Types: Marijuana    Comment: no longer using     Allergies   Abilify [aripiprazole], Ibuprofen, and Penicillins   Review of  Systems Review of Systems  HENT:  Positive for sore throat and trouble swallowing.   All other systems reviewed and are negative.   Physical Exam Triage Vital Signs ED Triage Vitals [08/16/20 1726]  Enc Vitals Group     BP 122/73     Pulse Rate 96     Resp 16     Temp 98.5 F (36.9 C)     Temp Source Oral     SpO2 100 %     Weight      Height      Head Circumference      Peak Flow      Pain Score      Pain Loc      Pain Edu?      Excl. in GC?    No data found.  Updated Vital Signs BP 122/73 (BP Location: Right Arm)   Pulse 96   Temp 98.5 F (36.9 C) (Oral)   Resp 16   SpO2 100%   Visual Acuity Right Eye Distance:   Left Eye Distance:   Bilateral Distance:    Right Eye Near:   Left Eye Near:    Bilateral Near:     Physical Exam Vitals and nursing note reviewed.  Constitutional:      General: She is not in acute distress.    Appearance: Normal appearance. She is not ill-appearing, toxic-appearing or diaphoretic.  HENT:     Head: Normocephalic and atraumatic.     Mouth/Throat:     Mouth: Mucous membranes are moist. No oral lesions.     Pharynx: Oropharynx is clear. Uvula midline. No pharyngeal swelling, oropharyngeal exudate, posterior oropharyngeal erythema or uvula swelling.     Tonsils: No tonsillar exudate or tonsillar abscesses. 0 on the right. 0 on the left.  Eyes:     Conjunctiva/sclera: Conjunctivae normal.  Cardiovascular:     Rate and Rhythm: Normal rate.     Pulses: Normal pulses.  Pulmonary:     Effort: Pulmonary effort is normal.  Abdominal:     General: Abdomen is flat.  Musculoskeletal:        General: Normal range of motion.     Cervical back: Normal range of motion.  Skin:    General: Skin is warm and dry.  Neurological:     General: No focal deficit present.     Mental Status: She is alert and oriented to person, place, and time.  Psychiatric:        Mood and  Affect: Mood normal.     UC Treatments / Results  Labs (all labs  ordered are listed, but only abnormal results are displayed) Labs Reviewed - No data to display  EKG   Radiology No results found.  Procedures Procedures (including critical care time)  Medications Ordered in UC Medications  alum & mag hydroxide-simeth (MAALOX/MYLANTA) 200-200-20 MG/5ML suspension 30 mL (30 mLs Oral Given 08/16/20 1802)    And  lidocaine (XYLOCAINE) 2 % viscous mouth solution 15 mL (15 mLs Oral Given 08/16/20 1802)    Initial Impression / Assessment and Plan / UC Course  I have reviewed the triage vital signs and the nursing notes.  Pertinent labs & imaging results that were available during my care of the patient were reviewed by me and considered in my medical decision making (see chart for details).    Assessment negative for red flags or concerns.  Given GI cocktail in office and symptoms greatly improved.  Maalox/Mylanta and viscous lidocaine prescribed every 6 hours as needed.  Recommend patient stop taking prednisone. Encourage fluids and rest.   Follow-up with primary care as needed. Final Clinical Impressions(s) / UC Diagnoses   Final diagnoses:  Sore throat  Gastroesophageal reflux disease without esophagitis     Discharge Instructions      You can take the Mylanta and Viscous lidocaine (mix it together) every 6 hours as needed for sore throat and acid reflux.    Make sure you drink plenty of fluids, especially water.   Return or go to the Emergency Department if symptoms worsen or do not improve in the next few days.      ED Prescriptions     Medication Sig Dispense Auth. Provider   alum & mag hydroxide-simeth (MYLANTA) 200-200-20 MG/5ML suspension Take 15 mLs by mouth every 6 (six) hours as needed for indigestion or heartburn. 355 mL Ivette Loyal, NP   lidocaine (XYLOCAINE) 2 % solution Use as directed 15 mLs in the mouth or throat every 6 (six) hours as needed for mouth pain. 100 mL Ivette Loyal, NP      PDMP not reviewed this  encounter.   Ivette Loyal, NP 08/16/20 [phone]

## 2020-08-16 NOTE — Discharge Instructions (Addendum)
You can take the Mylanta and Viscous lidocaine (mix it together) every 6 hours as needed for sore throat and acid reflux.    Make sure you drink plenty of fluids, especially water.   Return or go to the Emergency Department if symptoms worsen or do not improve in the next few days.

## 2020-08-16 NOTE — ED Triage Notes (Signed)
Pt present throat pain from taking prednisone, pt states that since taking the medication she has been having difficulty swallowing, loss of tasted. Pt state that she believes the medication is messing with her throat and caused her to have acid reflux to act up.

## 2020-08-24 ENCOUNTER — Encounter (HOSPITAL_COMMUNITY): Payer: Self-pay | Admitting: Emergency Medicine

## 2020-08-24 ENCOUNTER — Ambulatory Visit (HOSPITAL_COMMUNITY)
Admission: EM | Admit: 2020-08-24 | Discharge: 2020-08-24 | Disposition: A | Discharge status: Home / Self Care | Payer: Medicaid Other

## 2020-08-24 ENCOUNTER — Other Ambulatory Visit: Payer: Self-pay

## 2020-08-24 DIAGNOSIS — M25531 Pain in right wrist: Secondary | ICD-10-CM | POA: Diagnosis not present

## 2020-08-24 DIAGNOSIS — M25532 Pain in left wrist: Secondary | ICD-10-CM

## 2020-08-24 NOTE — ED Provider Notes (Signed)
[facility]    CSN: [id number] Arrival date & time: 08/24/20  1752      History   Chief Complaint Chief Complaint  Patient presents with   Wrist Pain    HPI Dawn Horton is a 35 y.o. female.   The history is provided by the patient. No language interpreter was used.  Wrist Pain This is a recurrent problem. The current episode started yesterday. The problem occurs constantly. Nothing aggravates the symptoms. Nothing relieves the symptoms. She has tried nothing for the symptoms. The treatment provided no relief.   Past Medical History:  Diagnosis Date   Anemia    Bipolar 1 disorder (HCC)    Depression    GERD (gastroesophageal reflux disease)    Tendinitis     Patient Active Problem List   Diagnosis Date Noted   Bipolar affective disorder, current episode manic with psychotic symptoms (HCC) 05/21/2016    Past Surgical History:  Procedure Laterality Date   CESAREAN SECTION      OB History     Gravida  3   Para  2   Term  2   Preterm  0   AB  1   Living  2      SAB  1   IAB  0   Ectopic  0   Multiple  0   Live Births  2            Home Medications    Prior to Admission medications   Medication Sig Start Date End Date Taking? Authorizing Provider  alum & mag hydroxide-simeth (MYLANTA) 200-200-20 MG/5ML suspension Take 15 mLs by mouth every 6 (six) hours as needed for indigestion or heartburn. 08/16/20   Ivette Loyal, NP  azithromycin (ZITHROMAX) 250 MG tablet Take 2 tabs day one, then 1 tab daily until complete 07/13/20   Particia Nearing, PA-C  cetirizine (ZYRTEC) 10 MG tablet Take 1 tablet (10 mg total) by mouth daily. 03/23/20   Georgetta Haber, NP  cyclobenzaprine (FLEXERIL) 5 MG tablet Take 1 tablet (5 mg total) by mouth 3 (three) times daily as needed for muscle spasms. DO NOT DRINK ALCOHOL OR DRIVE WHILE TAKING THIS MEDICATION 02/28/20   Particia Nearing, PA-C  fluticasone [facility]) 50 MCG/ACT nasal spray Place 1  spray into both nostrils daily. 03/23/20   Georgetta Haber, NP  lidocaine (XYLOCAINE) 2 % solution Use as directed 15 mLs in the mouth or throat every 6 (six) hours as needed for mouth pain. 08/16/20   Ivette Loyal, NP  lithium carbonate (ESKALITH) 450 MG CR tablet Take 450 mg by mouth daily.     [provider]  predniSONE (DELTASONE) 10 MG tablet Begin with 6 tabs on day 1, 5 tab on day 2, 4 tab on day 3, 3 tab on day 4, 2 tab on day 5, 1 tab on day 6-take with food 01/04/20   Wieters, [employer], PA-C  predniSONE (DELTASONE) 20 MG tablet Take 2 tablets (40 mg total) by mouth daily with breakfast. 07/13/20   Particia Nearing, PA-C  risperiDONE (RISPERDAL) 2 MG tablet Take 1 tablet (2 mg total) by mouth at bedtime. 05/20/16   Roxy Horseman, PA-C  trimethoprim-polymyxin b (POLYTRIM) ophthalmic solution Place 1 drop into both eyes every 4 (four) hours. 01/17/20   Moshe Cipro, NP  traZODone (DESYREL) 25 mg TABS tablet Take 50 mg by mouth at bedtime.  11/08/19  [provider]    Family  History Family History  Problem Relation Age of Onset   Healthy Mother    Healthy Father     Social History Social History   Tobacco Use   Smoking status: Every Day    Packs/day: 1.00    Years: 15.00    Pack years: 15.00    Types: Cigarettes   Smokeless tobacco: Never  Substance Use Topics   Alcohol use: Not Currently    Comment: Occ   Drug use: Not Currently    Types: Marijuana    Comment: no longer using     Allergies   Abilify [aripiprazole], Ibuprofen, and Penicillins   Review of Systems Review of Systems  All other systems reviewed and are negative.   Physical Exam Triage Vital Signs ED Triage Vitals  Enc Vitals Group     BP 08/24/20 1802 123/80     Pulse Rate 08/24/20 1802 93     Resp 08/24/20 1802 14     Temp 08/24/20 1802 98.6 F (37 C)     Temp Source 08/24/20 1802 Oral     SpO2 08/24/20 1802 100 %     Weight --      Height --      Head  Circumference --      Peak Flow --      Pain Score 08/24/20 1805 8     Pain Loc --      Pain Edu? --      Excl. in GC? --    No data found.  Updated Vital Signs BP 123/80 (BP Location: Right Arm)   Pulse 93   Temp 98.6 F (37 C) (Oral)   Resp 14   SpO2 100%   Visual Acuity Right Eye Distance:   Left Eye Distance:   Bilateral Distance:    Right Eye Near:   Left Eye Near:    Bilateral Near:     Physical Exam Vitals reviewed.  Musculoskeletal:        General: Swelling present.     Comments: Swelling bilat wrist, pain with movement  nv and ns intact   Skin:    General: Skin is warm.  Neurological:     General: No focal deficit present.     Mental Status: She is alert.  Psychiatric:        Mood and Affect: Mood normal.     UC Treatments / Results  Labs (all labs ordered are listed, but only abnormal results are displayed) Labs Reviewed - No data to display  EKG   Radiology No results found.  Procedures Procedures (including critical care time)  Medications Ordered in UC Medications - No data to display  Initial Impression / Assessment and Plan / UC Course  I have reviewed the triage vital signs and the nursing notes.  Pertinent labs & imaging results that were available during my care of the patient were reviewed by me and considered in my medical decision making (see chart for details).      Final Clinical Impressions(s) / UC Diagnoses   Final diagnoses:  Pain in both wrists     Discharge Instructions      Return if any problems.  [facility] has night wrist support braces.  Continue Naprosyn    ED Prescriptions   None    PDMP not reviewed this encounter.   Elson Areas, [place] 08/24/20 2005

## 2020-08-24 NOTE — Discharge Instructions (Addendum)
Return if any problems.  [facility] has night wrist support braces.  Continue Naprosyn

## 2020-08-24 NOTE — ED Triage Notes (Signed)
Bilateral wrist pain starting while at work today. Has had this problem intermittently since 35 years old. Mild swelling observed on back of left hand. Denies any new injury to either wrists.

## 2020-09-10 ENCOUNTER — Telehealth: Payer: Self-pay | Admitting: *Deleted

## 2020-09-10 ENCOUNTER — Institutional Professional Consult (permissible substitution): Payer: Medicaid Other | Admitting: Neurology

## 2020-09-10 ENCOUNTER — Encounter: Payer: Self-pay | Admitting: Neurology

## 2020-09-10 NOTE — Telephone Encounter (Signed)
No show 09-10-20  NX [place] [facility] 06/20/Paper/[facility]/Irene Grace Blight NP [phone]/numbness ofupper and lower bilateral extremities.

## 2020-11-07 ENCOUNTER — Other Ambulatory Visit: Payer: Self-pay

## 2020-11-07 ENCOUNTER — Ambulatory Visit (HOSPITAL_COMMUNITY)
Admission: EM | Admit: 2020-11-07 | Discharge: 2020-11-07 | Disposition: A | Discharge status: Home / Self Care | Payer: Medicaid Other | Attending: Family Medicine | Admitting: Family Medicine

## 2020-11-07 ENCOUNTER — Encounter (HOSPITAL_COMMUNITY): Payer: Self-pay | Admitting: Emergency Medicine

## 2020-11-07 DIAGNOSIS — H66003 Acute suppurative otitis media without spontaneous rupture of ear drum, bilateral: Secondary | ICD-10-CM | POA: Diagnosis not present

## 2020-11-07 DIAGNOSIS — J069 Acute upper respiratory infection, unspecified: Secondary | ICD-10-CM

## 2020-11-07 MED ORDER — NAPROXEN 500 MG PO TABS: 500.0000 mg | ORAL_TABLET | Freq: Two times a day (BID) | ORAL | 0 refills | Status: DC

## 2020-11-07 MED ORDER — DOXYCYCLINE HYCLATE 100 MG PO CAPS: 100.0000 mg | ORAL_CAPSULE | Freq: Two times a day (BID) | ORAL | 0 refills | Status: DC

## 2020-11-07 NOTE — ED Provider Notes (Signed)
[facility]   [id number] 11/07/20 Arrival Time: 0940  ASSESSMENT & PLAN:  1. Viral URI with cough   2. Non-recurrent acute suppurative otitis media of both ears without spontaneous rupture of tympanic membranes    Discussed typical duration of viral illnesses. OTC symptom care as needed.  Meds ordered this encounter  Medications   naproxen (NAPROSYN) 500 MG tablet    Sig: Take 1 tablet (500 mg total) by mouth 2 (two) times daily with a meal.    Dispense:  10 tablet    Refill:  0   doxycycline (VIBRAMYCIN) 100 MG capsule    Sig: Take 1 capsule (100 mg total) by mouth 2 (two) times daily.    Dispense:  14 capsule    Refill:  0   Work note provided. Discussed Lithium and Naprosyn. Reports she has taken multiple times int he past without issue.   Follow-up Information     Medicine, [facility].   Specialty: Family Medicine Why: If worsening or failing to improve as anticipated. Contact information: [address] [place] [address] [phone]                 Reviewed expectations re: course of current medical issues. Questions answered. Outlined signs and symptoms indicating need for more acute intervention. Understanding verbalized. After Visit Summary given.   SUBJECTIVE: History from: patient. Dawn Horton is a 35 y.o. female who reports nasal congestion, cough, ear pressure; x 3 days. Denies: fever and difficulty breathing. Normal PO intake without n/v/d.   OBJECTIVE:  Vitals:   11/07/20 1103  BP: 111/78  Pulse: 62  Resp: 17  SpO2: 98%    General appearance: alert; no distress Eyes: PERRLA; EOMI; conjunctiva normal HENT: [place]; AT; with nasal congestion; R and L TM with erythema/bulging Neck: supple  Lungs: speaks full sentences without difficulty; unlabored Extremities: no edema Skin: warm and dry Neurologic: normal gait Psychological: alert and cooperative; normal mood and affect  Allergies  Allergen Reactions    Abilify [Aripiprazole] Other (See Comments)    Makes pt delirious and forget things.    Ibuprofen Nausea Only   Penicillins Hives    amoxicillin    Past Medical History:  Diagnosis Date   Anemia    Bipolar 1 disorder (HCC)    Depression    GERD (gastroesophageal reflux disease)    Tendinitis    Social History   Socioeconomic History   Marital status: Single    Spouse name: Not on file   Number of children: Not on file   Years of education: Not on file   Highest education level: Not on file  Occupational History   Not on file  Tobacco Use   Smoking status: Every Day    Packs/day: 1.00    Years: 15.00    Pack years: 15.00    Types: Cigarettes   Smokeless tobacco: Never  Substance and Sexual Activity   Alcohol use: Not Currently    Comment: Occ   Drug use: Not Currently    Types: Marijuana    Comment: no longer using   Sexual activity: Yes    Birth control/protection: Implant  Other Topics Concern   Not on file  Social History Narrative   Not on file   Social Determinants of Health   Financial Resource Strain: Not on file  Food Insecurity: Not on file  Transportation Needs: Not on file  Physical Activity: Not on file  Stress: Not on file  Social  Connections: Not on file  Intimate Partner Violence: Not on file   Family History  Problem Relation Age of Onset   Healthy Mother    Healthy Father    Past Surgical History:  Procedure Laterality Date   CESAREAN SECTION       Mardella Layman, MD 11/07/20 [phone]

## 2020-11-07 NOTE — ED Triage Notes (Signed)
Pt is present today with bilateral ear pain and ear fullness. Pt states that she also has nasal congestion and a cough that started x3 days ago.

## 2020-12-14 ENCOUNTER — Other Ambulatory Visit: Payer: Self-pay

## 2020-12-14 ENCOUNTER — Ambulatory Visit (HOSPITAL_COMMUNITY): Admit: 2020-12-14 | Payer: Medicaid Other

## 2020-12-14 ENCOUNTER — Encounter (HOSPITAL_COMMUNITY): Payer: Self-pay | Admitting: *Deleted

## 2020-12-14 ENCOUNTER — Ambulatory Visit (HOSPITAL_COMMUNITY)
Admission: EM | Admit: 2020-12-14 | Discharge: 2020-12-14 | Disposition: A | Discharge status: Home / Self Care | Payer: Medicaid Other | Attending: Medical Oncology | Admitting: Medical Oncology

## 2020-12-14 DIAGNOSIS — Z889 Allergy status to unspecified drugs, medicaments and biological substances status: Secondary | ICD-10-CM | POA: Diagnosis not present

## 2020-12-14 DIAGNOSIS — M25531 Pain in right wrist: Secondary | ICD-10-CM | POA: Diagnosis not present

## 2020-12-14 DIAGNOSIS — Z789 Other specified health status: Secondary | ICD-10-CM

## 2020-12-14 MED ORDER — PREDNISONE 10 MG PO TABS: ORAL_TABLET | ORAL | 0 refills | Status: DC

## 2020-12-14 NOTE — ED Provider Notes (Signed)
[facility]    CSN: [id number] Arrival date & time: 12/14/20  1003      History   Chief Complaint Chief Complaint  Patient presents with   Wrist Pain    HPI Dawn Horton is a 35 y.o. female.   HPI  Wrist Pain: Pt reports that she has had right wrist pain for the past 3 days. Has had similar pain in the past with suspected tendonitis vs carpel tunnel syndrome. She has used naproxen in the past with mild improvement. She denies known injury but does type a lot at work. She denies numbness or tingling of hand. Some lateral wrist strength. Of note she cannot have other NSAIDs due to her concurrent medications and allergies.   Past Medical History:  Diagnosis Date   Anemia    Bipolar 1 disorder (HCC)    Depression    GERD (gastroesophageal reflux disease)    Tendinitis     Patient Active Problem List   Diagnosis Date Noted   Bipolar affective disorder, current episode manic with psychotic symptoms (HCC) 05/21/2016    Past Surgical History:  Procedure Laterality Date   CESAREAN SECTION      OB History     Gravida  3   Para  2   Term  2   Preterm  0   AB  1   Living  2      SAB  1   IAB  0   Ectopic  0   Multiple  0   Live Births  2            Home Medications    Prior to Admission medications   Medication Sig Start Date End Date Taking? Authorizing Provider  cetirizine (ZYRTEC) 10 MG tablet Take 1 tablet (10 mg total) by mouth daily. 03/23/20   Georgetta Haber, NP  doxycycline (VIBRAMYCIN) 100 MG capsule Take 1 capsule (100 mg total) by mouth 2 (two) times daily. 11/07/20   Mardella Layman, MD  fluticasone (FLONASE) 50 MCG/ACT nasal spray Place 1 spray into both nostrils daily. 03/23/20   Georgetta Haber, NP  folic acid (FOLVITE) 1 MG tablet Take 1 mg by mouth daily. 09/12/20   [provider]  lithium carbonate (ESKALITH) 450 MG CR tablet Take 450 mg by mouth daily.     [provider]  methotrexate (RHEUMATREX) 2.5  MG tablet Take by mouth. 10/17/20   [provider]  naproxen (NAPROSYN) 500 MG tablet Take 1 tablet (500 mg total) by mouth 2 (two) times daily with a meal. 11/07/20   Mardella Layman, MD  risperiDONE (RISPERDAL) 2 MG tablet Take 1 tablet (2 mg total) by mouth at bedtime. 05/20/16   Roxy Horseman, PA-C  trimethoprim-polymyxin b (POLYTRIM) ophthalmic solution Place 1 drop into both eyes every 4 (four) hours. 01/17/20   Moshe Cipro, NP  traZODone (DESYREL) 25 mg TABS tablet Take 50 mg by mouth at bedtime.  11/08/19  [provider]    Family History Family History  Problem Relation Age of Onset   Healthy Mother    Healthy Father     Social History Social History   Tobacco Use   Smoking status: Every Day    Packs/day: 1.00    Years: 15.00    Pack years: 15.00    Types: Cigarettes   Smokeless tobacco: Never  Substance Use Topics   Alcohol use: Not Currently    Comment: Occ   Drug use: Not Currently  Types: Marijuana    Comment: no longer using     Allergies   Abilify [aripiprazole], Ibuprofen, and Penicillins   Review of Systems Review of Systems  As stated above in HPI Physical Exam Triage Vital Signs ED Triage Vitals  Enc Vitals Group     BP 12/14/20 1134 111/77     Pulse Rate 12/14/20 1134 88     Resp 12/14/20 1134 18     Temp 12/14/20 1134 98 F (36.7 C)     Temp Source 12/14/20 1134 Oral     SpO2 12/14/20 1134 98 %     Weight --      Height --      Head Circumference --      Peak Flow --      Pain Score 12/14/20 1131 9     Pain Loc --      Pain Edu? --      Excl. in GC? --    No data found.  Updated Vital Signs BP 111/77   Pulse 88   Temp 98 F (36.7 C) (Oral)   Resp 18   SpO2 98%   Physical Exam Vitals and nursing note reviewed.  Constitutional:      General: She is not in acute distress.    Appearance: Normal appearance. She is not ill-appearing, toxic-appearing or diaphoretic.  Cardiovascular:     Pulses: Normal  pulses.  Musculoskeletal:        General: Tenderness (mild tinnel sign tenderness of the right wrist) present. No swelling or deformity.     Comments: NO increased warmth or joint erythema. Slightly decreased ROM throughout due to pain.   Skin:    Capillary Refill: Capillary refill takes less than 2 seconds.  Neurological:     Mental Status: She is alert and oriented to person, place, and time.     Motor: No weakness.     Coordination: Coordination normal.     UC Treatments / Results  Labs (all labs ordered are listed, but only abnormal results are displayed) Labs Reviewed - No data to display  EKG   Radiology No results found.  Procedures Procedures (including critical care time)  Medications Ordered in UC Medications - No data to display  Initial Impression / Assessment and Plan / UC Course  I have reviewed the triage vital signs and the nursing notes.  Pertinent labs & imaging results that were available during my care of the patient were reviewed by me and considered in my medical decision making (see chart for details).     New. Slightly wide differential. I am going to treat with lose dose steroid to help with any inflammation given her medication history and have her follow up with her PCP or orthopedics to further assess. Discussed red flag signs and symptoms. Follow up PRN.   Final Clinical Impressions(s) / UC Diagnoses   Final diagnoses:  None   Discharge Instructions   None    ED Prescriptions   None    PDMP not reviewed this encounter.   Rushie Chestnut, [place] 12/14/20 1221

## 2020-12-14 NOTE — ED Triage Notes (Signed)
Pt presents today with Rt wrist for 3 days. Pt reports she types a lot at work.

## 2021-01-15 ENCOUNTER — Telehealth: Payer: Medicaid Other | Admitting: Physician Assistant

## 2021-01-15 ENCOUNTER — Other Ambulatory Visit: Payer: Self-pay

## 2021-01-15 ENCOUNTER — Ambulatory Visit (HOSPITAL_COMMUNITY)
Admission: EM | Admit: 2021-01-15 | Discharge: 2021-01-15 | Discharge status: Against Medical Advice | Payer: Medicaid Other

## 2021-01-15 DIAGNOSIS — S46912A Strain of unspecified muscle, fascia and tendon at shoulder and upper arm level, left arm, initial encounter: Secondary | ICD-10-CM

## 2021-01-15 MED ORDER — METHYLPREDNISOLONE 4 MG PO TBPK: ORAL_TABLET | ORAL | 0 refills | Status: DC

## 2021-01-15 MED ORDER — CYCLOBENZAPRINE HCL 5 MG PO TABS: 5.0000 mg | ORAL_TABLET | Freq: Three times a day (TID) | ORAL | 0 refills | Status: DC | PRN

## 2021-01-15 NOTE — Progress Notes (Signed)
Virtual Visit Horton   Dawn Horton, you are scheduled for a virtual visit with a [facility] provider today.     Just as with appointments in the office, your Horton must be obtained to participate.  Your Horton will be active for this visit and any virtual visit you may have with one of our providers in the next 365 days.     If you have a MyChart account, a copy of this Horton can be sent to you electronically.  All virtual visits are billed to your insurance company just like a traditional visit in the office.    As this is a virtual visit, video technology does not allow for your provider to perform a traditional examination.  This may limit your provider's ability to fully assess your condition.  If your provider identifies any concerns that need to be evaluated in person or the need to arrange testing (such as labs, EKG, etc.), we will make arrangements to do so.     Although advances in technology are sophisticated, we cannot ensure that it will always work on either your end or our end.  If the connection with a video visit is poor, the visit may have to be switched to a telephone visit.  With either a video or telephone visit, we are not always able to ensure that we have a secure connection.     I need to obtain your verbal Horton now.   Are you willing to proceed with your visit today?    Dawn Horton on 01/15/2021 for a virtual visit (video or telephone).   Dawn Loveless, PA-C   Date: 01/15/2021 2:01 PM   Virtual Visit via Video Note   IMargaretann Horton, connected with  Dawn Horton  ([id number], 1985/05/27) on 01/15/21 at  1:45 PM EST by a video-enabled telemedicine application and verified that I am speaking with the correct person using two identifiers.  Location: Patient: Virtual Visit Location Patient: Mobile Provider: Virtual Visit Location Provider: Home Office   I discussed the limitations of evaluation and management by  telemedicine and the availability of in person appointments. The patient expressed understanding and agreed to proceed.    History of Present Illness: Dawn Horton is a 35 y.o. who identifies as a female who was assigned female at birth, and is being seen today for shoulder pain.  HPI: Shoulder Pain  The pain is present in the left shoulder. This is a new problem. The current episode started in the past 7 days (Friday morning; reports she slept on it wrong and it woke her up Friday morning numb and painful). There has been no history of extremity trauma. The problem occurs constantly. The problem has been gradually worsening. The quality of the pain is described as aching (spasm). The pain is moderate. Associated symptoms include an inability to bear weight, a limited range of motion and stiffness. Pertinent negatives include no fever, numbness or tingling. The symptoms are aggravated by activity. She has tried NSAIDS for the symptoms. The treatment provided no relief.     Problems:  Patient Active Problem List   Diagnosis Date Noted   Bipolar affective disorder, current episode manic with psychotic symptoms (HCC) 05/21/2016    Allergies:  Allergies  Allergen Reactions   Abilify [Aripiprazole] Other (See Comments)    Makes pt delirious and forget things.    Ibuprofen Nausea Only   Penicillins Hives    amoxicillin   Medications:  Current  Outpatient Medications:    cyclobenzaprine (FLEXERIL) 5 MG tablet, Take 1-2 tablets (5-10 mg total) by mouth 3 (three) times daily as needed for muscle spasms., Disp: 30 tablet, Rfl: 0   methylPREDNISolone (MEDROL DOSEPAK) 4 MG TBPK tablet, 6 day taper; take as directed on package instructions, Disp: 21 tablet, Rfl: 0   cetirizine (ZYRTEC) 10 MG tablet, Take 1 tablet (10 mg total) by mouth daily., Disp: 30 tablet, Rfl: 2   doxycycline (VIBRAMYCIN) 100 MG capsule, Take 1 capsule (100 mg total) by mouth 2 (two) times daily., Disp: 14 capsule, Rfl: 0    fluticasone (FLONASE) 50 MCG/ACT nasal spray, Place 1 spray into both nostrils daily., Disp: 16 g, Rfl: 2   folic acid (FOLVITE) 1 MG tablet, Take 1 mg by mouth daily., Disp: , Rfl:    lithium carbonate (ESKALITH) 450 MG CR tablet, Take 450 mg by mouth daily. , Disp: , Rfl:    methotrexate (RHEUMATREX) 2.5 MG tablet, Take by mouth., Disp: , Rfl:    naproxen (NAPROSYN) 500 MG tablet, Take 1 tablet (500 mg total) by mouth 2 (two) times daily with a meal., Disp: 10 tablet, Rfl: 0   predniSONE (DELTASONE) 10 MG tablet, Take 4 tablets by mouth with breakfast for 2 days, 2 tablets by mouth for 2 days and 1 tablet by mouth for 2 days., Disp: 14 tablet, Rfl: 0   risperiDONE (RISPERDAL) 2 MG tablet, Take 1 tablet (2 mg total) by mouth at bedtime., Disp: 14 tablet, Rfl: 0   trimethoprim-polymyxin b (POLYTRIM) ophthalmic solution, Place 1 drop into both eyes every 4 (four) hours., Disp: 10 mL, Rfl: 0  Observations/Objective: Patient is well-developed, well-nourished in no acute distress.  Resting comfortably  Head is normocephalic, atraumatic.  No labored breathing.  Speech is clear and coherent with logical content.  Patient is alert and oriented at baseline.    Assessment and Plan: 1. Strain of left shoulder, initial encounter - methylPREDNISolone (MEDROL DOSEPAK) 4 MG TBPK tablet; 6 day taper; take as directed on package instructions  Dispense: 21 tablet; Refill: 0 - cyclobenzaprine (FLEXERIL) 5 MG tablet; Take 1-2 tablets (5-10 mg total) by mouth 3 (three) times daily as needed for muscle spasms.  Dispense: 30 tablet; Refill: 0  - Not resolving with NSAIDs - Will add Medrol and muscle relaxer - Shoulder stretches and exercises provided via AVS - If not improving should be seen in person for further evaluation  Follow Up Instructions: I discussed the assessment and treatment plan with the patient. The patient was provided an opportunity to ask questions and all were answered. The patient agreed  with the plan and demonstrated an understanding of the instructions.  A copy of instructions were sent to the patient via MyChart unless otherwise noted below.   The patient was advised to call back or seek an in-person evaluation if the symptoms worsen or if the condition fails to improve as anticipated.  Time:  I spent 12 minutes with the patient via telehealth technology discussing the above problems/concerns.    Dawn Loveless, PA-C

## 2021-01-15 NOTE — Patient Instructions (Signed)
[name], thank you for joining [name], PA-C for today's virtual visit.  While this provider is not your primary care provider (PCP), if your PCP is located in our provider database this encounter information will be shared with them immediately following your visit.  Consent: (Patient) Dawn Horton provided verbal consent for this virtual visit at the beginning of the encounter.  Current Medications:  Current Outpatient Medications:    cyclobenzaprine (FLEXERIL) 5 MG tablet, Take 1-2 tablets (5-10 mg total) by mouth 3 (three) times daily as needed for muscle spasms., Disp: 30 tablet, Rfl: 0   methylPREDNISolone (MEDROL DOSEPAK) 4 MG TBPK tablet, 6 day taper; take as directed on package instructions, Disp: 21 tablet, Rfl: 0   cetirizine (ZYRTEC) 10 MG tablet, Take 1 tablet (10 mg total) by mouth daily., Disp: 30 tablet, Rfl: 2   doxycycline (VIBRAMYCIN) 100 MG capsule, Take 1 capsule (100 mg total) by mouth 2 (two) times daily., Disp: 14 capsule, Rfl: 0   fluticasone (FLONASE) 50 MCG/ACT nasal spray, Place 1 spray into both nostrils daily., Disp: 16 g, Rfl: 2   folic acid (FOLVITE) 1 MG tablet, Take 1 mg by mouth daily., Disp: , Rfl:    lithium carbonate (ESKALITH) 450 MG CR tablet, Take 450 mg by mouth daily. , Disp: , Rfl:    methotrexate (RHEUMATREX) 2.5 MG tablet, Take by mouth., Disp: , Rfl:    naproxen (NAPROSYN) 500 MG tablet, Take 1 tablet (500 mg total) by mouth 2 (two) times daily with a meal., Disp: 10 tablet, Rfl: 0   predniSONE (DELTASONE) 10 MG tablet, Take 4 tablets by mouth with breakfast for 2 days, 2 tablets by mouth for 2 days and 1 tablet by mouth for 2 days., Disp: 14 tablet, Rfl: 0   risperiDONE (RISPERDAL) 2 MG tablet, Take 1 tablet (2 mg total) by mouth at bedtime., Disp: 14 tablet, Rfl: 0   trimethoprim-polymyxin b (POLYTRIM) ophthalmic solution, Place 1 drop into both eyes every 4 (four) hours., Disp: 10 mL, Rfl: 0   Medications ordered in this  encounter:  Meds ordered this encounter  Medications   methylPREDNISolone (MEDROL DOSEPAK) 4 MG TBPK tablet    Sig: 6 day taper; take as directed on package instructions    Dispense:  21 tablet    Refill:  0    Order Specific Question:   Supervising Provider    Answer:   Hyacinth Meeker, BRIAN [[id number]]   cyclobenzaprine (FLEXERIL) 5 MG tablet    Sig: Take 1-2 tablets (5-10 mg total) by mouth 3 (three) times daily as needed for muscle spasms.    Dispense:  30 tablet    Refill:  0    Order Specific Question:   Supervising Provider    Answer:   Hyacinth Meeker, BRIAN [[id number]]     *If you need refills on other medications prior to your next appointment, please contact your pharmacy*  Follow-Up: Call back or seek an in-person evaluation if the symptoms worsen or if the condition fails to improve as anticipated.  Other Instructions Shoulder Exercises Ask your health care provider which exercises are safe for you. Do exercises exactly as told by your health care provider and adjust them as directed. It is normal to feel mild stretching, pulling, tightness, or discomfort as you do these exercises. Stop right away if you feel sudden pain or your pain gets worse. Do not begin these exercises until told by your health care provider. Stretching exercises External rotation and abduction This exercise  is sometimes called corner stretch. This exercise rotates your arm outward (external rotation) and moves your arm out from your body (abduction). Stand in a doorway with one of your feet slightly in front of the other. This is called a staggered stance. If you cannot reach your forearms to the door frame, stand facing a corner of a room. Choose one of the following positions as told by your health care provider: Place your hands and forearms on the door frame above your head. Place your hands and forearms on the door frame at the height of your head. Place your hands on the door frame at the height of your elbows. Slowly  move your weight onto your front foot until you feel a stretch across your chest and in the front of your shoulders. Keep your head and chest upright and keep your abdominal muscles tight. Hold for __________ seconds. To release the stretch, shift your weight to your back foot. Repeat __________ times. Complete this exercise __________ times a day. Extension, standing Stand and hold a broomstick, a cane, or a similar object behind your back. Your hands should be a little wider than shoulder width apart. Your palms should face away from your back. Keeping your elbows straight and your shoulder muscles relaxed, move the stick away from your body until you feel a stretch in your shoulders (extension). Avoid shrugging your shoulders while you move the stick. Keep your shoulder blades tucked down toward the middle of your back. Hold for __________ seconds. Slowly return to the starting position. Repeat __________ times. Complete this exercise __________ times a day. Range-of-motion exercises Pendulum  Stand near a wall or a surface that you can hold onto for balance. Bend at the waist and let your left / right arm hang straight down. Use your other arm to support you. Keep your back straight and do not lock your knees. Relax your left / right arm and shoulder muscles, and move your hips and your trunk so your left / right arm swings freely. Your arm should swing because of the motion of your body, not because you are using your arm or shoulder muscles. Keep moving your hips and trunk so your arm swings in the following directions, as told by your health care provider: Side to side. Forward and backward. In clockwise and counterclockwise circles. Continue each motion for __________ seconds, or for as long as told by your health care provider. Slowly return to the starting position. Repeat __________ times. Complete this exercise __________ times a day. Shoulder flexion, standing  Stand and hold  a broomstick, a cane, or a similar object. Place your hands a little more than shoulder width apart on the object. Your left / right hand should be palm up, and your other hand should be palm down. Keep your elbow straight and your shoulder muscles relaxed. Push the stick up with your healthy arm to raise your left / right arm in front of your body, and then over your head until you feel a stretch in your shoulder (flexion). Avoid shrugging your shoulder while you raise your arm. Keep your shoulder blade tucked down toward the middle of your back. Hold for __________ seconds. Slowly return to the starting position. Repeat __________ times. Complete this exercise __________ times a day. Shoulder abduction, standing Stand and hold a broomstick, a cane, or a similar object. Place your hands a little more than shoulder width apart on the object. Your left / right hand should be palm up,  and your other hand should be palm down. Keep your elbow straight and your shoulder muscles relaxed. Push the object across your body toward your left / right side. Raise your left / right arm to the side of your body (abduction) until you feel a stretch in your shoulder. Do not raise your arm above shoulder height unless your health care provider tells you to do that. If directed, raise your arm over your head. Avoid shrugging your shoulder while you raise your arm. Keep your shoulder blade tucked down toward the middle of your back. Hold for __________ seconds. Slowly return to the starting position. Repeat __________ times. Complete this exercise __________ times a day. Internal rotation  Place your left / right hand behind your back, palm up. Use your other hand to dangle an exercise band, a towel, or a similar object over your shoulder. Grasp the band with your left / right hand so you are holding on to both ends. Gently pull up on the band until you feel a stretch in the front of your left / right shoulder. The  movement of your arm toward the center of your body is called internal rotation. Avoid shrugging your shoulder while you raise your arm. Keep your shoulder blade tucked down toward the middle of your back. Hold for __________ seconds. Release the stretch by letting go of the band and lowering your hands. Repeat __________ times. Complete this exercise __________ times a day. Strengthening exercises External rotation  Sit in a stable chair without armrests. Secure an exercise band to a stable object at elbow height on your left / right side. Place a soft object, such as a folded towel or a small pillow, between your left / right upper arm and your body to move your elbow about 4 inches (10 cm) away from your side. Hold the end of the exercise band so it is tight and there is no slack. Keeping your elbow pressed against the soft object, slowly move your forearm out, away from your abdomen (external rotation). Keep your body steady so only your forearm moves. Hold for __________ seconds. Slowly return to the starting position. Repeat __________ times. Complete this exercise __________ times a day. Shoulder abduction  Sit in a stable chair without armrests, or stand up. Hold a __________ weight in your left / right hand, or hold an exercise band with both hands. Start with your arms straight down and your left / right palm facing in, toward your body. Slowly lift your left / right hand out to your side (abduction). Do not lift your hand above shoulder height unless your health care provider tells you that this is safe. Keep your arms straight. Avoid shrugging your shoulder while you do this movement. Keep your shoulder blade tucked down toward the middle of your back. Hold for __________ seconds. Slowly lower your arm, and return to the starting position. Repeat __________ times. Complete this exercise __________ times a day. Shoulder extension Sit in a stable chair without armrests, or stand  up. Secure an exercise band to a stable object in front of you so it is at shoulder height. Hold one end of the exercise band in each hand. Your palms should face each other. Straighten your elbows and lift your hands up to shoulder height. Step back, away from the secured end of the exercise band, until the band is tight and there is no slack. Squeeze your shoulder blades together as you pull your hands down to the sides  of your thighs (extension). Stop when your hands are straight down by your sides. Do not let your hands go behind your body. Hold for __________ seconds. Slowly return to the starting position. Repeat __________ times. Complete this exercise __________ times a day. Shoulder row Sit in a stable chair without armrests, or stand up. Secure an exercise band to a stable object in front of you so it is at waist height. Hold one end of the exercise band in each hand. Position your palms so that your thumbs are facing the ceiling (neutral position). Bend each of your elbows to a 90-degree angle (right angle) and keep your upper arms at your sides. Step back until the band is tight and there is no slack. Slowly pull your elbows back behind you. Hold for __________ seconds. Slowly return to the starting position. Repeat __________ times. Complete this exercise __________ times a day. Shoulder press-ups  Sit in a stable chair that has armrests. Sit upright, with your feet flat on the floor. Put your hands on the armrests so your elbows are bent and your fingers are pointing forward. Your hands should be about even with the sides of your body. Push down on the armrests and use your arms to lift yourself off the chair. Straighten your elbows and lift yourself up as much as you comfortably can. Move your shoulder blades down, and avoid letting your shoulders move up toward your ears. Keep your feet on the ground. As you get stronger, your feet should support less of your body weight as  you lift yourself up. Hold for __________ seconds. Slowly lower yourself back into the chair. Repeat __________ times. Complete this exercise __________ times a day. Wall push-ups  Stand so you are facing a stable wall. Your feet should be about one arm-length away from the wall. Lean forward and place your palms on the wall at shoulder height. Keep your feet flat on the floor as you bend your elbows and lean forward toward the wall. Hold for __________ seconds. Straighten your elbows to push yourself back to the starting position. Repeat __________ times. Complete this exercise __________ times a day. This information is not intended to replace advice given to you by your health care provider. Make sure you discuss any questions you have with your health care provider. Document Revised: 05/28/2018 Document Reviewed: 03/05/2018 Elsevier Patient Education  [address]  [facility] [address], [place], [place] [address] URGENT CARE HOURS Monday - Friday: 8:00am to 8:00pm Saturday: 10:00am to 3:00pm   If you have been instructed to have an in-person evaluation today at a local Urgent Care facility, please use the link below. It will take you to a list of all of our available [facility], including address, phone number and hours of operation. Please do not delay care.  [facility]  If you or a family member do not have a primary care provider, use the link below to schedule a visit and establish care. When you choose a [facility] primary care physician or advanced practice provider, you gain a long-term partner in health. Find a Primary Care Provider  Learn more about [facility]'s in-office and virtual care options: [facility] - Get Care Now

## 2021-02-07 ENCOUNTER — Telehealth: Payer: Medicaid Other | Admitting: Physician Assistant

## 2021-02-07 DIAGNOSIS — M25531 Pain in right wrist: Secondary | ICD-10-CM | POA: Diagnosis not present

## 2021-02-07 MED ORDER — METHYLPREDNISOLONE 4 MG PO TBPK: ORAL_TABLET | ORAL | 0 refills | Status: DC

## 2021-02-07 NOTE — Progress Notes (Signed)
Virtual Visit Consent   Dawn Horton, you are scheduled for a virtual visit with a [facility] provider today.     Just as with appointments in the office, your consent must be obtained to participate.  Your consent will be active for this visit and any virtual visit you may have with one of our providers in the next 365 days.     If you have a MyChart account, a copy of this consent can be sent to you electronically.  All virtual visits are billed to your insurance company just like a traditional visit in the office.    As this is a virtual visit, video technology does not allow for your provider to perform a traditional examination.  This may limit your provider's ability to fully assess your condition.  If your provider identifies any concerns that need to be evaluated in person or the need to arrange testing (such as labs, EKG, etc.), we will make arrangements to do so.     Although advances in technology are sophisticated, we cannot ensure that it will always work on either your end or our end.  If the connection with a video visit is poor, the visit may have to be switched to a telephone visit.  With either a video or telephone visit, we are not always able to ensure that we have a secure connection.     I need to obtain your verbal consent now.   Are you willing to proceed with your visit today?    Dawn Horton has provided verbal consent on 02/07/2021 for a virtual visit (video or telephone).   Dawn Horton, [place]   Date: 02/07/2021 9:54 AM   Virtual Visit via Video Note   I, Dawn Horton, connected with  Dawn Horton  ([id number], Feb 27, 1985) on 02/07/21 at  9:45 AM EST by a video-enabled telemedicine application and verified that I am speaking with the correct person using two identifiers.  Location: Patient: Virtual Visit Location Patient: Home Provider: Virtual Visit Location Provider: Home Office   I discussed the limitations of evaluation and management by  telemedicine and the availability of in person appointments. The patient expressed understanding and agreed to proceed.    History of Present Illness: Dawn Horton is a 35 y.o. who identifies as a female who was assigned female at birth, and is being seen today for right wrist pain starting over the past two days. Notes pain is mostly on the ulnar side of wrist, anterior and posterior. Is a throbbing pain. Denies any numbness, tingling or weakness. 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 Has a R wrist brace from previous issue at home but has not worn yet.   HPI: HPI  Problems:  Patient Active Problem List   Diagnosis Date Noted   Bipolar affective disorder, current episode manic with psychotic symptoms (HCC) 05/21/2016    Allergies:  Allergies  Allergen Reactions   Abilify [Aripiprazole] Other (See Comments)    Makes pt delirious and forget things.    Ibuprofen Nausea Only   Penicillins Hives    amoxicillin   Medications:  Current Outpatient Medications:    methylPREDNISolone (MEDROL DOSEPAK) 4 MG TBPK tablet, Take following package directions., Disp: 21 tablet, Rfl: 0   cetirizine (ZYRTEC) 10 MG tablet, Take 1 tablet (10 mg total) by mouth daily., Disp: 30 tablet, Rfl: 2   fluticasone (FLONASE) 50 MCG/ACT nasal spray, Place 1 spray  into both nostrils daily., Disp: 16 g, Rfl: 2   folic acid (FOLVITE) 1 MG tablet, Take 1 mg by mouth daily., Disp: , Rfl:    lithium carbonate (ESKALITH) 450 MG CR tablet, Take 450 mg by mouth daily. , Disp: , Rfl:    methotrexate (RHEUMATREX) 2.5 MG tablet, Take by mouth., Disp: , Rfl:    risperiDONE (RISPERDAL) 2 MG tablet, Take 1 tablet (2 mg total) by mouth at bedtime., Disp: 14 tablet, Rfl: 0   trimethoprim-polymyxin b (POLYTRIM) ophthalmic solution, Place 1 drop into both eyes every 4 (four) hours., Disp: 10 mL, Rfl: 0  Observations/Objective: Patient is well-developed, well-nourished in no  acute distress.  Resting comfortably at home.  Head is normocephalic, atraumatic.  No labored breathing. Speech is clear and coherent with logical content.  Patient is alert and oriented at baseline.   Assessment and Plan: 1. Right wrist pain - methylPREDNISolone (MEDROL DOSEPAK) 4 MG TBPK tablet; Take following package directions.  Dispense: 21 tablet; Refill: 0  Overuse versus possible start of carpal tunnel. Start use of her R wrist brace at night and during the day when possible. Giving her intolerance to NSAIDS and interaction with lithium will give a small steroid dose pack for inflammation. Supportive measures reviewed. Will need in-person evaluation if symptoms are not resolving.   Follow Up Instructions: I discussed the assessment and treatment plan with the patient. The patient was provided an opportunity to ask questions and all were answered. The patient agreed with the plan and demonstrated an understanding of the instructions.  A copy of instructions were sent to the patient via MyChart unless otherwise noted below.   The patient was advised to call back or seek an in-person evaluation if the symptoms worsen or if the condition fails to improve as anticipated.  Time:  I spent 12 minutes with the patient via telehealth technology discussing the above problems/concerns.    Dawn Climes, PA-C

## 2021-02-07 NOTE — Patient Instructions (Signed)
°  [name], thank you for joining [name], PA-C for today's virtual visit.  While this provider is not your primary care provider (PCP), if your PCP is located in our provider database this encounter information will be shared with them immediately following your visit.  Consent: (Patient) Dawn Horton provided verbal consent for this virtual visit at the beginning of the encounter.  Current Medications:  Current Outpatient Medications:    cetirizine (ZYRTEC) 10 MG tablet, Take 1 tablet (10 mg total) by mouth daily., Disp: 30 tablet, Rfl: 2   cyclobenzaprine (FLEXERIL) 5 MG tablet, Take 1-2 tablets (5-10 mg total) by mouth 3 (three) times daily as needed for muscle spasms., Disp: 30 tablet, Rfl: 0   doxycycline (VIBRAMYCIN) 100 MG capsule, Take 1 capsule (100 mg total) by mouth 2 (two) times daily., Disp: 14 capsule, Rfl: 0   fluticasone (FLONASE) 50 MCG/ACT nasal spray, Place 1 spray into both nostrils daily., Disp: 16 g, Rfl: 2   folic acid (FOLVITE) 1 MG tablet, Take 1 mg by mouth daily., Disp: , Rfl:    lithium carbonate (ESKALITH) 450 MG CR tablet, Take 450 mg by mouth daily. , Disp: , Rfl:    methotrexate (RHEUMATREX) 2.5 MG tablet, Take by mouth., Disp: , Rfl:    methylPREDNISolone (MEDROL DOSEPAK) 4 MG TBPK tablet, 6 day taper; take as directed on package instructions, Disp: 21 tablet, Rfl: 0   naproxen (NAPROSYN) 500 MG tablet, Take 1 tablet (500 mg total) by mouth 2 (two) times daily with a meal., Disp: 10 tablet, Rfl: 0   predniSONE (DELTASONE) 10 MG tablet, Take 4 tablets by mouth with breakfast for 2 days, 2 tablets by mouth for 2 days and 1 tablet by mouth for 2 days., Disp: 14 tablet, Rfl: 0   risperiDONE (RISPERDAL) 2 MG tablet, Take 1 tablet (2 mg total) by mouth at bedtime., Disp: 14 tablet, Rfl: 0   trimethoprim-polymyxin b (POLYTRIM) ophthalmic solution, Place 1 drop into both eyes every 4 (four) hours., Disp: 10 mL, Rfl: 0   Medications ordered in this  encounter:  No orders of the defined types were placed in this encounter.    *If you need refills on other medications prior to your next appointment, please contact your pharmacy*  Follow-Up: Call back or seek an in-person evaluation if the symptoms worsen or if the condition fails to improve as anticipated.  Other Instructions Please wear the wrist brace as directed. Elevate the wrist while resting.  Apply ice for 10 minutes, a few times per day.  Use the steroid pack as directed. Tylenol for breakthrough pain. If symptoms are not resolving or you note new/worsening symptoms, you need an in-person evaluation.    If you have been instructed to have an in-person evaluation today at a local Urgent Care facility, please use the link below. It will take you to a list of all of our available [facility], including address, phone number and hours of operation. Please do not delay care.  [facility]  If you or a family member do not have a primary care provider, use the link below to schedule a visit and establish care. When you choose a [facility] primary care physician or advanced practice provider, you gain a long-term partner in health. Find a Primary Care Provider  Learn more about [facility]'s in-office and virtual care options: [facility] - Get Care Now

## 2021-03-15 ENCOUNTER — Telehealth: Payer: Medicaid Other | Admitting: Physician Assistant

## 2021-03-15 DIAGNOSIS — S46912A Strain of unspecified muscle, fascia and tendon at shoulder and upper arm level, left arm, initial encounter: Secondary | ICD-10-CM

## 2021-03-15 MED ORDER — MELOXICAM 15 MG PO TABS: 15.0000 mg | ORAL_TABLET | Freq: Every day | ORAL | 0 refills | Status: DC

## 2021-03-15 NOTE — Patient Instructions (Signed)
Dawn Horton, thank you for joining Dawn LovelessJennifer M Tahlor Berenguer, PA-C for today's virtual visit.  While this provider is not your primary care provider (PCP), if your PCP is located in our provider database this encounter information will be shared with them immediately following your visit.  Consent: (Patient) Dawn Horton provided verbal consent for this virtual visit at the beginning of the encounter.  Current Medications:  Current Outpatient Medications:    meloxicam (MOBIC) 15 MG tablet, Take 1 tablet (15 mg total) by mouth daily., Disp: 30 tablet, Rfl: 0   cetirizine (ZYRTEC) 10 MG tablet, Take 1 tablet (10 mg total) by mouth daily., Disp: 30 tablet, Rfl: 2   fluticasone (FLONASE) 50 MCG/ACT nasal spray, Place 1 spray into both nostrils daily., Disp: 16 g, Rfl: 2   folic acid (FOLVITE) 1 MG tablet, Take 1 mg by mouth daily., Disp: , Rfl:    lithium carbonate (ESKALITH) 450 MG CR tablet, Take 450 mg by mouth daily. , Disp: , Rfl:    methotrexate (RHEUMATREX) 2.5 MG tablet, Take by mouth., Disp: , Rfl:    methylPREDNISolone (MEDROL DOSEPAK) 4 MG TBPK tablet, Take following package directions., Disp: 21 tablet, Rfl: 0   risperiDONE (RISPERDAL) 2 MG tablet, Take 1 tablet (2 mg total) by mouth at bedtime., Disp: 14 tablet, Rfl: 0   trimethoprim-polymyxin b (POLYTRIM) ophthalmic solution, Place 1 drop into both eyes every 4 (four) hours., Disp: 10 mL, Rfl: 0   Medications ordered in this encounter:  Meds ordered this encounter  Medications   meloxicam (MOBIC) 15 MG tablet    Sig: Take 1 tablet (15 mg total) by mouth daily.    Dispense:  30 tablet    Refill:  0    Order Specific Question:   Supervising Provider    Answer:   Hyacinth MeekerMILLER, BRIAN [[id number]]     *If you need refills on other medications prior to your next appointment, please contact your pharmacy*  Follow-Up: Call back or seek an in-person evaluation if the symptoms worsen or if the condition fails to improve as anticipated.  Other  Instructions [facility] [address] [place], [place] [id number] URGENT CARE HOURS Monday - Friday: 8:00am to 8:00pm Saturday: 10:00am to 3:00pm   Shoulder Exercises Ask your health care provider which exercises are safe for you. Do exercises exactly as told by your health care provider and adjust them as directed. It is normal to feel mild stretching, pulling, tightness, or discomfort as you do these exercises. Stop right away if you feel sudden pain or your pain gets worse. Do not begin these exercises until told by your health care provider. Stretching exercises External rotation and abduction This exercise is sometimes called corner stretch. This exercise rotates your arm outward (external rotation) and moves your arm out from your body (abduction). Stand in a doorway with one of your feet slightly in front of the other. This is called a staggered stance. If you cannot reach your forearms to the door frame, stand facing a corner of a room. Choose one of the following positions as told by your health care provider: Place your hands and forearms on the door frame above your head. Place your hands and forearms on the door frame at the height of your head. Place your hands on the door frame at the height of your elbows. Slowly move your weight onto your front foot until you feel a stretch across your chest and in the front of your shoulders. Keep your head and  chest upright and keep your abdominal muscles tight. Hold for __________ seconds. To release the stretch, shift your weight to your back foot. Repeat __________ times. Complete this exercise __________ times a day. Extension, standing Stand and hold a broomstick, a cane, or a similar object behind your back. Your hands should be a little wider than shoulder width apart. Your palms should face away from your back. Keeping your elbows straight and your shoulder muscles relaxed, move the stick away from your body until  you feel a stretch in your shoulders (extension). Avoid shrugging your shoulders while you move the stick. Keep your shoulder blades tucked down toward the middle of your back. Hold for __________ seconds. Slowly return to the starting position. Repeat __________ times. Complete this exercise __________ times a day. Range-of-motion exercises Pendulum  Stand near a wall or a surface that you can hold onto for balance. Bend at the waist and let your left / right arm hang straight down. Use your other arm to support you. Keep your back straight and do not lock your knees. Relax your left / right arm and shoulder muscles, and move your hips and your trunk so your left / right arm swings freely. Your arm should swing because of the motion of your body, not because you are using your arm or shoulder muscles. Keep moving your hips and trunk so your arm swings in the following directions, as told by your health care provider: Side to side. Forward and backward. In clockwise and counterclockwise circles. Continue each motion for __________ seconds, or for as long as told by your health care provider. Slowly return to the starting position. Repeat __________ times. Complete this exercise __________ times a day. Shoulder flexion, standing  Stand and hold a broomstick, a cane, or a similar object. Place your hands a little more than shoulder width apart on the object. Your left / right hand should be palm up, and your other hand should be palm down. Keep your elbow straight and your shoulder muscles relaxed. Push the stick up with your healthy arm to raise your left / right arm in front of your body, and then over your head until you feel a stretch in your shoulder (flexion). Avoid shrugging your shoulder while you raise your arm. Keep your shoulder blade tucked down toward the middle of your back. Hold for __________ seconds. Slowly return to the starting position. Repeat __________ times. Complete  this exercise __________ times a day. Shoulder abduction, standing Stand and hold a broomstick, a cane, or a similar object. Place your hands a little more than shoulder width apart on the object. Your left / right hand should be palm up, and your other hand should be palm down. Keep your elbow straight and your shoulder muscles relaxed. Push the object across your body toward your left / right side. Raise your left / right arm to the side of your body (abduction) until you feel a stretch in your shoulder. Do not raise your arm above shoulder height unless your health care provider tells you to do that. If directed, raise your arm over your head. Avoid shrugging your shoulder while you raise your arm. Keep your shoulder blade tucked down toward the middle of your back. Hold for __________ seconds. Slowly return to the starting position. Repeat __________ times. Complete this exercise __________ times a day. Internal rotation  Place your left / right hand behind your back, palm up. Use your other hand to dangle an exercise band,  a towel, or a similar object over your shoulder. Grasp the band with your left / right hand so you are holding on to both ends. Gently pull up on the band until you feel a stretch in the front of your left / right shoulder. The movement of your arm toward the center of your body is called internal rotation. Avoid shrugging your shoulder while you raise your arm. Keep your shoulder blade tucked down toward the middle of your back. Hold for __________ seconds. Release the stretch by letting go of the band and lowering your hands. Repeat __________ times. Complete this exercise __________ times a day. Strengthening exercises External rotation  Sit in a stable chair without armrests. Secure an exercise band to a stable object at elbow height on your left / right side. Place a soft object, such as a folded towel or a small pillow, between your left / right upper arm and  your body to move your elbow about 4 inches (10 cm) away from your side. Hold the end of the exercise band so it is tight and there is no slack. Keeping your elbow pressed against the soft object, slowly move your forearm out, away from your abdomen (external rotation). Keep your body steady so only your forearm moves. Hold for __________ seconds. Slowly return to the starting position. Repeat __________ times. Complete this exercise __________ times a day. Shoulder abduction  Sit in a stable chair without armrests, or stand up. Hold a __________ weight in your left / right hand, or hold an exercise band with both hands. Start with your arms straight down and your left / right palm facing in, toward your body. Slowly lift your left / right hand out to your side (abduction). Do not lift your hand above shoulder height unless your health care provider tells you that this is safe. Keep your arms straight. Avoid shrugging your shoulder while you do this movement. Keep your shoulder blade tucked down toward the middle of your back. Hold for __________ seconds. Slowly lower your arm, and return to the starting position. Repeat __________ times. Complete this exercise __________ times a day. Shoulder extension Sit in a stable chair without armrests, or stand up. Secure an exercise band to a stable object in front of you so it is at shoulder height. Hold one end of the exercise band in each hand. Your palms should face each other. Straighten your elbows and lift your hands up to shoulder height. Step back, away from the secured end of the exercise band, until the band is tight and there is no slack. Squeeze your shoulder blades together as you pull your hands down to the sides of your thighs (extension). Stop when your hands are straight down by your sides. Do not let your hands go behind your body. Hold for __________ seconds. Slowly return to the starting position. Repeat __________ times.  Complete this exercise __________ times a day. Shoulder row Sit in a stable chair without armrests, or stand up. Secure an exercise band to a stable object in front of you so it is at waist height. Hold one end of the exercise band in each hand. Position your palms so that your thumbs are facing the ceiling (neutral position). Bend each of your elbows to a 90-degree angle (right angle) and keep your upper arms at your sides. Step back until the band is tight and there is no slack. Slowly pull your elbows back behind you. Hold for __________ seconds. Slowly return  to the starting position. Repeat __________ times. Complete this exercise __________ times a day. Shoulder press-ups  Sit in a stable chair that has armrests. Sit upright, with your feet flat on the floor. Put your hands on the armrests so your elbows are bent and your fingers are pointing forward. Your hands should be about even with the sides of your body. Push down on the armrests and use your arms to lift yourself off the chair. Straighten your elbows and lift yourself up as much as you comfortably can. Move your shoulder blades down, and avoid letting your shoulders move up toward your ears. Keep your feet on the ground. As you get stronger, your feet should support less of your body weight as you lift yourself up. Hold for __________ seconds. Slowly lower yourself back into the chair. Repeat __________ times. Complete this exercise __________ times a day. Wall push-ups  Stand so you are facing a stable wall. Your feet should be about one arm-length away from the wall. Lean forward and place your palms on the wall at shoulder height. Keep your feet flat on the floor as you bend your elbows and lean forward toward the wall. Hold for __________ seconds. Straighten your elbows to push yourself back to the starting position. Repeat __________ times. Complete this exercise __________ times a day. This information is not intended  to replace advice given to you by your health care provider. Make sure you discuss any questions you have with your health care provider. Document Revised: 05/28/2018 Document Reviewed: 03/05/2018 Elsevier Patient Education  2022 [employer].    If you have been instructed to have an in-person evaluation today at a local Urgent Care facility, please use the link below. It will take you to a list of all of our available [facility], including address, phone number and hours of operation. Please do not delay care.  [facility]  If you or a family member do not have a primary care provider, use the link below to schedule a visit and establish care. When you choose a [facility] primary care physician or advanced practice provider, you gain a long-term partner in health. Find a Primary Care Provider  Learn more about [facility]'s in-office and virtual care options: [facility] - Get Care Now

## 2021-03-15 NOTE — Progress Notes (Signed)
Virtual Visit Consent   Dawn Horton, you are scheduled for a virtual visit with a [facility] provider today.     Just as with appointments in the office, your consent must be obtained to participate.  Your consent will be active for this visit and any virtual visit you may have with one of our providers in the next 365 days.     If you have a MyChart account, a copy of this consent can be sent to you electronically.  All virtual visits are billed to your insurance company just like a traditional visit in the office.    As this is a virtual visit, video technology does not allow for your provider to perform a traditional examination.  This may limit your provider's ability to fully assess your condition.  If your provider identifies any concerns that need to be evaluated in person or the need to arrange testing (such as labs, EKG, etc.), we will make arrangements to do so.     Although advances in technology are sophisticated, we cannot ensure that it will always work on either your end or our end.  If the connection with a video visit is poor, the visit may have to be switched to a telephone visit.  With either a video or telephone visit, we are not always able to ensure that we have a secure connection.     I need to obtain your verbal consent now.   Are you willing to proceed with your visit today?    Dawn Horton has provided verbal consent on 03/15/2021 for a virtual visit (video or telephone).   Dawn Loveless, PA-C   Date: 03/15/2021 12:37 PM   Virtual Visit via Video Note   IMargaretann Horton, connected with  Junette Bernat  ([id number], 06/23/1985) on 03/15/21 at 12:30 PM EST by a video-enabled telemedicine application and verified that I am speaking with the correct person using two identifiers.  Location: Patient: Virtual Visit Location Patient: Home Provider: Virtual Visit Location Provider: Home Office   I discussed the limitations of evaluation and management by  telemedicine and the availability of in person appointments. The patient expressed understanding and agreed to proceed.    History of Present Illness: Dawn Horton is a 36 y.o. who identifies as a female who was assigned female at birth, and is being seen today for left shoulder pain.  HPI: Shoulder Pain  The pain is present in the left shoulder. This is a new problem. The current episode started yesterday. There has been no history of extremity trauma. The problem occurs constantly. The problem has been gradually worsening. The quality of the pain is described as aching and sharp (sharp with movements). Associated symptoms include a limited range of motion and stiffness. Pertinent negatives include no numbness or tingling. The symptoms are aggravated by activity. She has tried acetaminophen and NSAIDS (aleve and tylenol) for the symptoms. The treatment provided mild relief. There is no history of osteoarthritis or rheumatoid arthritis.   Does report lifted heavy package at work and that was what irritated the shoulder. Does have a history of this, last injury was 01/15/21. Works at [employer].  Problems:  Patient Active Problem List   Diagnosis Date Noted   Bipolar affective disorder, current episode manic with psychotic symptoms (HCC) 05/21/2016    Allergies:  Allergies  Allergen Reactions   Abilify [Aripiprazole] Other (See Comments)    Makes pt delirious and forget things.    Ibuprofen Nausea Only  Penicillins Hives    amoxicillin   Medications:  Current Outpatient Medications:    meloxicam (MOBIC) 15 MG tablet, Take 1 tablet (15 mg total) by mouth daily., Disp: 30 tablet, Rfl: 0   cetirizine (ZYRTEC) 10 MG tablet, Take 1 tablet (10 mg total) by mouth daily., Disp: 30 tablet, Rfl: 2   fluticasone (FLONASE) 50 MCG/ACT nasal spray, Place 1 spray into both nostrils daily., Disp: 16 g, Rfl: 2   folic acid (FOLVITE) 1 MG tablet, Take 1 mg by mouth daily., Disp: , Rfl:    lithium carbonate  (ESKALITH) 450 MG CR tablet, Take 450 mg by mouth daily. , Disp: , Rfl:    methotrexate (RHEUMATREX) 2.5 MG tablet, Take by mouth., Disp: , Rfl:    methylPREDNISolone (MEDROL DOSEPAK) 4 MG TBPK tablet, Take following package directions., Disp: 21 tablet, Rfl: 0   risperiDONE (RISPERDAL) 2 MG tablet, Take 1 tablet (2 mg total) by mouth at bedtime., Disp: 14 tablet, Rfl: 0   trimethoprim-polymyxin b (POLYTRIM) ophthalmic solution, Place 1 drop into both eyes every 4 (four) hours., Disp: 10 mL, Rfl: 0  Observations/Objective: Patient is well-developed, well-nourished in no acute distress.  Resting comfortably at home.  Head is normocephalic, atraumatic.  No labored breathing.  Speech is clear and coherent with logical content.  Patient is alert and oriented at baseline.    Assessment and Plan: 1. Strain of left shoulder, initial encounter - meloxicam (MOBIC) 15 MG tablet; Take 1 tablet (15 mg total) by mouth daily.  Dispense: 30 tablet; Refill: 0  - Discussed she would need to be seen in person for any Worker's Comp; she declines filing - Will add meloxicam; hold all other NSAIDs - May continue tylenol - Seek in person evaluation with PCP or Orthopedic UC if not improving  Follow Up Instructions: I discussed the assessment and treatment plan with the patient. The patient was provided an opportunity to ask questions and all were answered. The patient agreed with the plan and demonstrated an understanding of the instructions.  A copy of instructions were sent to the patient via MyChart unless otherwise noted below.    The patient was advised to call back or seek an in-person evaluation if the symptoms worsen or if the condition fails to improve as anticipated.  Time:  I spent 12 minutes with the patient via telehealth technology discussing the above problems/concerns.    Dawn Loveless, PA-C

## 2021-04-15 ENCOUNTER — Other Ambulatory Visit: Payer: Self-pay

## 2021-04-15 ENCOUNTER — Ambulatory Visit (HOSPITAL_COMMUNITY)
Admission: EM | Admit: 2021-04-15 | Discharge: 2021-04-15 | Disposition: A | Discharge status: Home / Self Care | Payer: Medicaid Other | Attending: Sports Medicine | Admitting: Sports Medicine

## 2021-04-15 ENCOUNTER — Encounter (HOSPITAL_COMMUNITY): Payer: Self-pay | Admitting: Emergency Medicine

## 2021-04-15 DIAGNOSIS — J302 Other seasonal allergic rhinitis: Secondary | ICD-10-CM | POA: Diagnosis not present

## 2021-04-15 DIAGNOSIS — H5789 Other specified disorders of eye and adnexa: Secondary | ICD-10-CM | POA: Diagnosis not present

## 2021-04-15 DIAGNOSIS — R0981 Nasal congestion: Secondary | ICD-10-CM

## 2021-04-15 MED ORDER — FLUTICASONE PROPIONATE 50 MCG/ACT NA SUSP: 1.0000 | Freq: Every day | NASAL | 2 refills | Status: DC

## 2021-04-15 MED ORDER — CETIRIZINE HCL 10 MG PO TABS: 10.0000 mg | ORAL_TABLET | Freq: Every day | ORAL | 2 refills | Status: DC

## 2021-04-15 NOTE — ED Triage Notes (Signed)
Pt reports nasal congestion, cough and sinus pressure x 2 weeks.

## 2021-04-15 NOTE — ED Provider Notes (Signed)
[place]    CSN: [id number] Arrival date & time: 04/15/21  1127      History   Chief Complaint Chief Complaint  Patient presents with   Cough   Nasal Congestion    HPI Dawn Horton is a 36 y.o. female here for cough and nasal congestion, sneezing.   Cough Associated symptoms: headaches and rhinorrhea   Associated symptoms: no chest pain, no chills, no fever, no rash, no shortness of breath, no sore throat and no wheezing    Day 8 or 9 of symptoms Nasal congestion and runny nose Sinus pressure Cough, mostly non-productive On Zyrtec for allergies Sneezing as well Has a history of seasonal allergies and allergic rhinitis.  Used to be on cetirizine, although she is out of this medication  Some headaches occasionally  No chest pain or shortness of breath  No abd pain, no N/V/D  Meds: Alternate tylenol and aleve    Past Medical History:  Diagnosis Date   Anemia    Bipolar 1 disorder (HCC)    Depression    GERD (gastroesophageal reflux disease)    Tendinitis     Patient Active Problem List   Diagnosis Date Noted   Bipolar affective disorder, current episode manic with psychotic symptoms ([place]) 05/21/2016    Past Surgical History:  Procedure Laterality Date   CESAREAN SECTION      OB History     Gravida  3   Para  2   Term  2   Preterm  0   AB  1   Living  2      SAB  1   IAB  0   Ectopic  0   Multiple  0   Live Births  2            Home Medications    Prior to Admission medications   Medication Sig Start Date End Date Taking? Authorizing Provider  cetirizine (ZYRTEC) 10 MG tablet Take 1 tablet (10 mg total) by mouth daily. 04/15/21   Elba Barman, DO  fluticasone (FLONASE) 50 MCG/ACT nasal spray Place 1 spray into both nostrils daily. 04/15/21   Elba Barman, DO  folic acid (FOLVITE) 1 MG tablet Take 1 mg by mouth daily. 09/12/20   [provider]  lithium carbonate (ESKALITH) 450 MG CR tablet Take 450 mg by  mouth daily.     [provider]  meloxicam (MOBIC) 15 MG tablet Take 1 tablet (15 mg total) by mouth daily. 03/15/21   Mar Daring, PA-C  methotrexate (RHEUMATREX) 2.5 MG tablet Take by mouth. 10/17/20   [provider]  methylPREDNISolone (MEDROL DOSEPAK) 4 MG TBPK tablet Take following package directions. 02/07/21   Brunetta Jeans, PA-C  risperiDONE (RISPERDAL) 2 MG tablet Take 1 tablet (2 mg total) by mouth at bedtime. 05/20/16   Montine Circle, PA-C  trimethoprim-polymyxin b (POLYTRIM) ophthalmic solution Place 1 drop into both eyes every 4 (four) hours. 01/17/20   Faustino Congress, NP  traZODone (DESYREL) 25 mg TABS tablet Take 50 mg by mouth at bedtime.  11/08/19  [provider]    Family History Family History  Problem Relation Age of Onset   Healthy Mother    Healthy Father     Social History Social History   Tobacco Use   Smoking status: Every Day    Packs/day: 1.00    Years: 15.00    Pack years: 15.00    Types: Cigarettes   Smokeless tobacco: Never  Substance Use Topics   Alcohol use: Not Currently    Comment: Occ   Drug use: Not Currently    Types: Marijuana    Comment: no longer using     Allergies   Abilify [aripiprazole], Ibuprofen, and Penicillins   Review of Systems Review of Systems  Constitutional:  Negative for chills and fever.  HENT:  Positive for congestion, rhinorrhea and sneezing. Negative for sore throat.   Respiratory:  Positive for cough. Negative for shortness of breath and wheezing.   Cardiovascular:  Negative for chest pain.  Gastrointestinal:  Negative for abdominal pain, diarrhea, nausea and vomiting.  Skin:  Negative for rash.  Neurological:  Positive for headaches. Negative for dizziness and weakness.    Physical Exam Triage Vital Signs ED Triage Vitals  Enc Vitals Group     BP 04/15/21 1245 92/67     Pulse Rate 04/15/21 1245 85     Resp 04/15/21 1245 18     Temp 04/15/21 1245 98.1 F  (36.7 C)     Temp Source 04/15/21 1245 Oral     SpO2 04/15/21 1245 98 %     Weight 04/15/21 1244 190 lb 14.7 oz (86.6 kg)     Height 04/15/21 1244 5' 4.5" (1.638 m)     Head Circumference --      Peak Flow --      Pain Score 04/15/21 1243 7     Pain Loc --      Pain Edu? --      Excl. in [place]? --    No data found.  Updated Vital Signs BP 92/67 (BP Location: Left Arm)    Pulse 85    Temp 98.1 F (36.7 C) (Oral)    Resp 18    Ht 5' 4.5" (1.638 m)    Wt 86.6 kg    SpO2 98%    BMI 32.26 kg/m   Physical Exam Constitutional:      General: She is not in acute distress.    Appearance: Normal appearance. She is not toxic-appearing.  HENT:     Head: Normocephalic and atraumatic.     Right Ear: Tympanic membrane and external ear normal.     Left Ear: Tympanic membrane and external ear normal.     Nose: Rhinorrhea present.     Comments: + boggy, nasal turbinates, slightly erythematous    Mouth/Throat:     Mouth: Mucous membranes are moist.     Pharynx: No oropharyngeal exudate or posterior oropharyngeal erythema.     Comments: + posterior cobblestoning Eyes:     Conjunctiva/sclera: Conjunctivae normal.     Pupils: Pupils are equal, round, and reactive to light.     Comments: + tearing of eyes + allergic shiners present b/l  Cardiovascular:     Rate and Rhythm: Normal rate.     Pulses: Normal pulses.  Pulmonary:     Effort: Pulmonary effort is normal. No respiratory distress.     Breath sounds: No wheezing, rhonchi or rales.  Abdominal:     General: Abdomen is flat.     Palpations: Abdomen is soft.  Musculoskeletal:     Cervical back: Normal range of motion.  Lymphadenopathy:     Cervical: No cervical adenopathy.  Skin:    General: Skin is warm.     Capillary Refill: Capillary refill takes less than 2 seconds.  Neurological:     Mental Status: She is alert.  Psychiatric:        Mood and  Affect: Mood normal.     UC Treatments / Results  Labs (all labs ordered are  listed, but only abnormal results are displayed) Labs Reviewed - No data to display  EKG   Radiology No results found.  Procedures Procedures (including critical care time)  Medications Ordered in UC Medications - No data to display  Initial Impression / Assessment and Plan / UC Course  I have reviewed the triage vital signs and the nursing notes.  Pertinent labs & imaging results that were available during my care of the patient were reviewed by me and considered in my medical decision making (see chart for details).     Allergic rhinitis Seasonal allergies Nasal congestion and headache  Patient with nasal congestion, rhinorrhea, and allergy-like symptoms for just over 1 week.  Used to be on Zyrtec, although has been out of this and has been able to take anything.  Denies any recent sick contacts.  No fever or chills.  She is well-appearing on exam today, afebrile.  She does have allergic shiners and boggy, Erythematous nasal mucosa suggestive of allergies. Will send Rx for flonase and zyrtec.  Discussed avoiding allergy-like symptoms if possible.  Continue to stay well-hydrated.  Did discuss this will likely take a week or so for symptoms to improve, although she may return to work.  Return precaution provided.  If her nasal congestion and symptoms do not improve, or she develops any new onset of fevers, she is to present for reevaluation.  She is agreeable to this plan.  Safe for discharge home. Final Clinical Impressions(s) / UC Diagnoses   Final diagnoses:  Nasal congestion  Seasonal allergies  Seasonal allergic rhinitis, unspecified trigger   Discharge Instructions   None    ED Prescriptions     Medication Sig Dispense Auth. Provider   cetirizine (ZYRTEC) 10 MG tablet Take 1 tablet (10 mg total) by mouth daily. 30 tablet Elba Barman, DO   fluticasone [facility]) 50 MCG/ACT nasal spray Place 1 spray into both nostrils daily. 16 g Elba Barman, DO      PDMP not  reviewed this encounter.   Elba Barman, DO 04/15/21 1335

## 2021-05-11 ENCOUNTER — Telehealth: Payer: Medicaid Other | Admitting: Family Medicine

## 2021-05-11 DIAGNOSIS — J301 Allergic rhinitis due to pollen: Secondary | ICD-10-CM | POA: Diagnosis not present

## 2021-05-11 MED ORDER — PREDNISONE 10 MG (21) PO TBPK: ORAL_TABLET | ORAL | 0 refills | Status: DC

## 2021-05-11 NOTE — Progress Notes (Signed)
?Virtual Visit Consent  ? ?Dawn Horton, you are scheduled for a virtual visit with a [facility] provider today.   ?  ?Just as with appointments in the office, your consent must be obtained to participate.  Your consent will be active for this visit and any virtual visit you may have with one of our providers in the next 365 days.   ?  ?If you have a MyChart account, a copy of this consent can be sent to you electronically.  All virtual visits are billed to your insurance company just like a traditional visit in the office.   ? ?As this is a virtual visit, video technology does not allow for your provider to perform a traditional examination.  This may limit your provider's ability to fully assess your condition.  If your provider identifies any concerns that need to be evaluated in person or the need to arrange testing (such as labs, EKG, etc.), we will make arrangements to do so.   ?  ?Although advances in technology are sophisticated, we cannot ensure that it will always work on either your end or our end.  If the connection with a video visit is poor, the visit may have to be switched to a telephone visit.  With either a video or telephone visit, we are not always able to ensure that we have a secure connection.    ? ?I need to obtain your verbal consent now.   Are you willing to proceed with your visit today?  ?  ?Dawn Horton has provided verbal consent on 05/11/2021 for a virtual visit (video or telephone). ?  ?Dawn Curio, FNP  ? ?Date: 05/11/2021 5:26 PM ? ? ?Virtual Visit via Video Note  ? ?Dawn Horton, connected with  Dawn Horton  ([id number], [id number]) on 05/11/21 at  5:30 PM EDT by a video-enabled telemedicine application and verified that I am speaking with the correct person using two identifiers. ? ?Location: ?Patient: Virtual Visit Location Patient: Home ?Provider: Virtual Visit Location Provider: Home Office ?  ?I discussed the limitations of evaluation and management by telemedicine and the  availability of in person appointments. The patient expressed understanding and agreed to proceed.   ? ?History of Present Illness: ?Dawn Horton is a 36 y.o. who identifies as a female who was assigned female at birth, and is being seen today for allergies with enlarged lymph nodes left side of neck. Congested with itching and watering eyes. She says she ran out of cetirizine and just started it back when allergies got out of control. No fever, no colored discharge. . ? ?HPI: HPI  ?Problems:  ?Patient Active Problem List  ? Diagnosis Date Noted  ? Bipolar affective disorder, current episode manic with psychotic symptoms (HCC) 05/21/2016  ?  ?Allergies:  ?Allergies  ?Allergen Reactions  ? Abilify [Aripiprazole] Other (See Comments)  ?  Makes pt delirious and forget things.   ? Ibuprofen Nausea Only  ? Penicillins Hives  ?  amoxicillin  ? ?Medications:  ?Current Outpatient Medications:  ?  predniSONE (STERAPRED UNI-PAK 21 TAB) 10 MG (21) TBPK tablet, Take 1 tablet (10 mg total) by mouth daily for 6 days, THEN 1 tablet (10 mg total) daily for 6 days. Prednisone 10 mg---6 day dose pack as directed., Disp: 1 each, Rfl: 0 ?  cetirizine (ZYRTEC) 10 MG tablet, Take 1 tablet (10 mg total) by mouth daily., Disp: 30 tablet, Rfl: 2 ?  fluticasone (FLONASE) 50 MCG/ACT nasal spray, Place 1 spray into  both nostrils daily., Disp: 16 g, Rfl: 2 ?  folic acid (FOLVITE) 1 MG tablet, Take 1 mg by mouth daily., Disp: , Rfl:  ?  lithium carbonate (ESKALITH) 450 MG CR tablet, Take 450 mg by mouth daily. , Disp: , Rfl:  ?  meloxicam (MOBIC) 15 MG tablet, Take 1 tablet (15 mg total) by mouth daily., Disp: 30 tablet, Rfl: 0 ?  methotrexate (RHEUMATREX) 2.5 MG tablet, Take by mouth., Disp: , Rfl:  ?  risperiDONE (RISPERDAL) 2 MG tablet, Take 1 tablet (2 mg total) by mouth at bedtime., Disp: 14 tablet, Rfl: 0 ?  trimethoprim-polymyxin b (POLYTRIM) ophthalmic solution, Place 1 drop into both eyes every 4 (four) hours., Disp: 10 mL, Rfl:  0 ? ?Observations/Objective: ?Patient is well-developed, well-nourished in no acute distress.  ?Resting comfortably \ at home.  ?Head is normocephalic, atraumatic.  ?No labored breathing.  ? ?Speech is clear and coherent with logical content.  ?Patient is alert and oriented at baseline.  ? ?Assessment and Plan: ?1. Seasonal allergic rhinitis due to pollen ? ?Increase fluids, continue cetirizine and flonase, medication use and side effects discussed, proceed to urgent care for worsening sx.  ? ?Follow Up Instructions: ?I discussed the assessment and treatment plan with the patient. The patient was provided an opportunity to ask questions and all were answered. The patient agreed with the plan and demonstrated an understanding of the instructions.  A copy of instructions were sent to the patient via MyChart unless otherwise noted below.  ? ? ? ?The patient was advised to call back or seek an in-person evaluation if the symptoms worsen or if the condition fails to improve as anticipated. ? ?Time:  ?I spent 8 minutes with the patient via telehealth technology discussing the above problems/concerns.   ? ?Dawn Curio, FNP  ?

## 2021-05-11 NOTE — Patient Instructions (Signed)
Allergic Rhinitis, Adult  Allergic rhinitis is an allergic reaction that affects the mucous membraneinside the nose. The mucous membrane is the tissue that produces mucus. There are two types of allergic rhinitis: Seasonal. This type is also called hay fever and happens only during certain seasons. Perennial. This type can happen at any time of the year. Allergic rhinitis cannot be spread from person to person. This condition can bemild, moderate, or severe. It can develop at any age and may be outgrown. What are the causes? This condition is caused by allergens. These are things that can cause an allergic reaction. Allergens may differ for seasonal allergic rhinitis and perennial allergic rhinitis. Seasonal allergic rhinitis is triggered by pollen. Pollen can come from grasses, trees, and weeds. Perennial allergic rhinitis may be triggered by: Dust mites. Proteins in a pet's urine, saliva, or dander. Dander is dead skin cells from a pet. Smoke, mold, or car fumes. What increases the risk? You are more likely to develop this condition if you have a family history of allergies or other conditions related to allergies, including: Allergic conjunctivitis. This is inflammation of parts of the eyes and eyelids. Asthma. This condition affects the lungs and makes it hard to breathe. Atopic dermatitis or eczema. This is long term (chronic) inflammation of the skin. Food allergies. What are the signs or symptoms? Symptoms of this condition include: Sneezing or coughing. A stuffy nose (nasal congestion), itchy nose, or nasal discharge. Itchy eyes and tearing of the eyes. A feeling of mucus dripping down the back of your throat (postnasal drip). Trouble sleeping. Tiredness or fatigue. Headache. Sore throat. How is this diagnosed? This condition may be diagnosed with your symptoms, medical history, and physical exam. Your health care provider may check for related conditions, such  as: Asthma. Pink eye. This is eye inflammation caused by infection (conjunctivitis). Ear infection. Upper respiratory infection. This is an infection in the nose, throat, or upper airways. You may also have tests to find out which allergens trigger your symptoms.These may include skin tests or blood tests. How is this treated? There is no cure for this condition, but treatment can help control symptoms. Treatment may include: Taking medicines that block allergy symptoms, such as corticosteroids and antihistamines. Medicine may be given as a shot, nasal spray, or pill. Avoiding any allergens. Being exposed again and again to tiny amounts of allergens to help you build a defense against allergens (immunotherapy). This is done if other treatments have not helped. It may include: Allergy shots. These are injected medicines that have small amounts of allergen in them. Sublingual immunotherapy. This involves taking small doses of a medicine with allergen in it under your tongue. If these treatments do not work, your health care provider may prescribe newer,stronger medicines. Follow these instructions at home: Avoiding allergens Find out what you are allergic to and avoid those allergens. These are some things you can do to help avoid allergens: If you have perennial allergies: Replace carpet with wood, tile, or vinyl flooring. Carpet can trap dander and dust. Do not smoke. Do not allow smoking in your home. Change your heating and air conditioning filters at least once a month. If you have seasonal allergies, take these steps during allergy season: Keep windows closed as much as possible. Plan outdoor activities when pollen counts are lowest. Check pollen counts before you plan outdoor activities. When coming indoors, change clothing and shower before sitting on furniture or bedding. If you have a pet in the house that   produces allergens: Keep the pet out of the bedroom. Vacuum, sweep, and dust  regularly. General instructions Take over-the-counter and prescription medicines only as told by your health care provider. Drink enough fluid to keep your urine pale yellow. Keep all follow-up visits as told by your health care provider. This is important. Where to find more information American Academy of Allergy, Asthma & Immunology: www.aaaai.org Contact a health care provider if: You have a fever. You develop a cough that does not go away. You make whistling sounds when you breathe (wheeze). Your symptoms slow you down or stop you from doing your normal activities each day. Get help right away if: You have shortness of breath. This symptom may represent a serious problem that is an emergency. Do not wait to see if the symptom will go away. Get medical help right away. Call your local emergency services (911 in the [place] Do not drive yourself to the hospital. Summary Allergic rhinitis may be managed by taking medicines as directed and avoiding allergens. If you have seasonal allergies, keep windows closed as much as possible during allergy season. Contact your health care provider if you develop a fever or a cough that does not go away. This information is not intended to replace advice given to you by your health care provider. Make sure you discuss any questions you have with your healthcare provider. Document Revised: 03/25/2019 Document Reviewed: 02/01/2019 Elsevier Patient Education  2022 Elsevier Inc.  

## 2021-05-20 ENCOUNTER — Telehealth: Payer: Medicaid Other | Admitting: Physician Assistant

## 2021-05-20 DIAGNOSIS — H1013 Acute atopic conjunctivitis, bilateral: Secondary | ICD-10-CM | POA: Diagnosis not present

## 2021-05-20 MED ORDER — LEVOCETIRIZINE DIHYDROCHLORIDE 5 MG PO TABS: 5.0000 mg | ORAL_TABLET | Freq: Every evening | ORAL | 1 refills | Status: DC

## 2021-05-20 MED ORDER — AZELASTINE HCL 0.05 % OP SOLN: 1.0000 [drp] | Freq: Two times a day (BID) | OPHTHALMIC | 1 refills | Status: DC

## 2021-05-20 NOTE — Patient Instructions (Signed)
?  Dawn Horton, thank you for joining [name], PA-C for today's virtual visit.  While this provider is not your primary care provider (PCP), if your PCP is located in our provider database this encounter information will be shared with them immediately following your visit. ? ?Consent: ?(Patient) Dawn Horton provided verbal consent for this virtual visit at the beginning of the encounter. ? ?Current Medications: ? ?Current Outpatient Medications:  ?  azelastine (OPTIVAR) 0.05 % ophthalmic solution, Place 1 drop into both eyes 2 (two) times daily., Disp: 6 mL, Rfl: 1 ?  levocetirizine (XYZAL) 5 MG tablet, Take 1 tablet (5 mg total) by mouth every evening., Disp: 30 tablet, Rfl: 1 ?  fluticasone (FLONASE) 50 MCG/ACT nasal spray, Place 1 spray into both nostrils daily., Disp: 16 g, Rfl: 2 ?  folic acid (FOLVITE) 1 MG tablet, Take 1 mg by mouth daily., Disp: , Rfl:  ?  lithium carbonate (ESKALITH) 450 MG CR tablet, Take 450 mg by mouth daily. , Disp: , Rfl:  ?  meloxicam (MOBIC) 15 MG tablet, Take 1 tablet (15 mg total) by mouth daily., Disp: 30 tablet, Rfl: 0 ?  methotrexate (RHEUMATREX) 2.5 MG tablet, Take by mouth., Disp: , Rfl:  ?  risperiDONE (RISPERDAL) 2 MG tablet, Take 1 tablet (2 mg total) by mouth at bedtime., Disp: 14 tablet, Rfl: 0  ? ?Medications ordered in this encounter:  ?Meds ordered this encounter  ?Medications  ? azelastine (OPTIVAR) 0.05 % ophthalmic solution  ?  Sig: Place 1 drop into both eyes 2 (two) times daily.  ?  Dispense:  6 mL  ?  Refill:  1  ?  Order Specific Question:   Supervising Provider  ?  Answer:   Eber Hong [[id number]]  ? levocetirizine (XYZAL) 5 MG tablet  ?  Sig: Take 1 tablet (5 mg total) by mouth every evening.  ?  Dispense:  30 tablet  ?  Refill:  1  ?  Order Specific Question:   Supervising Provider  ?  Answer:   Eber Hong [[id number]]  ?  ? ?*If you need refills on other medications prior to your next appointment, please contact your pharmacy* ? ?Follow-Up: ?Call back  or seek an in-person evaluation if the symptoms worsen or if the condition fails to improve as anticipated. ? ?Other Instructions ?Please use the levocetirizine (Xyzal) in place of the cetirizine (Zyrtec). ?Continue your Flonase nasal spray. ?Start the allergy eye drops I have sent in, using as directed. ? ?Keep hands washed. ?Avoid rubbing the eyes. ?Cold compresses over the eyes may help with itching. ? ? ?If you have been instructed to have an in-person evaluation today at a local Urgent Care facility, please use the link below. It will take you to a list of all of our available [facility], including address, phone number and hours of operation. Please do not delay care.  ?[facility] ? ?If you or a family member do not have a primary care provider, use the link below to schedule a visit and establish care. When you choose a [facility] primary care physician or advanced practice provider, you gain a long-term partner in health. ?Find a Primary Care Provider ? ?Learn more about [facility] in-office and virtual care options: ?[facility] Get Care Now  ?

## 2021-05-20 NOTE — Progress Notes (Signed)
?Virtual Visit Consent  ? ?Dawn Horton, you are scheduled for a virtual visit with a [facility] provider today.   ?  ?Just as with appointments in the office, your consent must be obtained to participate.  Your consent will be active for this visit and any virtual visit you may have with one of our providers in the next 365 days.   ?  ?If you have a MyChart account, a copy of this consent can be sent to you electronically.  All virtual visits are billed to your insurance company just like a traditional visit in the office.   ? ?As this is a virtual visit, video technology does not allow for your provider to perform a traditional examination.  This may limit your provider's ability to fully assess your condition.  If your provider identifies any concerns that need to be evaluated in person or the need to arrange testing (such as labs, EKG, etc.), we will make arrangements to do so.   ?  ?Although advances in technology are sophisticated, we cannot ensure that it will always work on either your end or our end.  If the connection with a video visit is poor, the visit may have to be switched to a telephone visit.  With either a video or telephone visit, we are not always able to ensure that we have a secure connection.    ? ?I need to obtain your verbal consent now.   Are you willing to proceed with your visit today?  ?  ?Dawn Horton has provided verbal consent on 05/20/2021 for a virtual visit (video or telephone). ?  ?[name], PA-C  ? ?Date: 05/20/2021 5:55 PM ? ? ?Virtual Visit via Video Note  ? ?IPiedad Climes, connected with  Dawn Horton  ([id number], Feb 10, 1986) on 05/20/21 at  5:45 PM EDT by a video-enabled telemedicine application and verified that I am speaking with the correct person using two identifiers. ? ?Location: ?Patient: Virtual Visit Location Patient: Home ?Provider: Virtual Visit Location Provider: Home Office ?  ?I discussed the limitations of evaluation and management by  telemedicine and the availability of in person appointments. The patient expressed understanding and agreed to proceed.   ? ?History of Present Illness: ?Dawn Horton is a 36 y.o. who identifies as a female who was assigned female at birth, and is being seen today for redness, itching, watering of eyes since recent increase in her seasonal allergies. Is taking Zyrtec and Flonase OTC currently. Was previously prescribed olopatadine but her insurance will no longer cover. Has not tried OTC dose yet. Denies vision change or eye pain. Denies fevers, chills.  ?HPI: HPI  ?Problems:  ?Patient Active Problem List  ? Diagnosis Date Noted  ? Bipolar affective disorder, current episode manic with psychotic symptoms (HCC) 05/21/2016  ?  ?Allergies:  ?Allergies  ?Allergen Reactions  ? Abilify [Aripiprazole] Other (See Comments)  ?  Makes pt delirious and forget things.   ? Ibuprofen Nausea Only  ? Penicillins Hives  ?  amoxicillin  ? ?Medications:  ?Current Outpatient Medications:  ?  azelastine (OPTIVAR) 0.05 % ophthalmic solution, Place 1 drop into both eyes 2 (two) times daily., Disp: 6 mL, Rfl: 1 ?  levocetirizine (XYZAL) 5 MG tablet, Take 1 tablet (5 mg total) by mouth every evening., Disp: 30 tablet, Rfl: 1 ?  fluticasone (FLONASE) 50 MCG/ACT nasal spray, Place 1 spray into both nostrils daily., Disp: 16 g, Rfl: 2 ?  folic acid (FOLVITE) 1 MG  tablet, Take 1 mg by mouth daily., Disp: , Rfl:  ?  lithium carbonate (ESKALITH) 450 MG CR tablet, Take 450 mg by mouth daily. , Disp: , Rfl:  ?  meloxicam (MOBIC) 15 MG tablet, Take 1 tablet (15 mg total) by mouth daily., Disp: 30 tablet, Rfl: 0 ?  methotrexate (RHEUMATREX) 2.5 MG tablet, Take by mouth., Disp: , Rfl:  ?  risperiDONE (RISPERDAL) 2 MG tablet, Take 1 tablet (2 mg total) by mouth at bedtime., Disp: 14 tablet, Rfl: 0 ? ?Observations/Objective: ?Patient is well-developed, well-nourished in no acute distress.  ?Resting comfortably at home.  ?Head is normocephalic,  atraumatic.  ?No labored breathing. ?Speech is clear and coherent with logical content.  ?Patient is alert and oriented at baseline.  ?Bilateral conjunctival injection and watering of eyes noted. EOMI.  ? ?Assessment and Plan: ?1. Allergic conjunctivitis of both eyes ?- azelastine (OPTIVAR) 0.05 % ophthalmic solution; Place 1 drop into both eyes 2 (two) times daily.  Dispense: 6 mL; Refill: 1 ?- levocetirizine (XYZAL) 5 MG tablet; Take 1 tablet (5 mg total) by mouth every evening.  Dispense: 30 tablet; Refill: 1 ? ?In patient with substantial seasonal allergies and history of allergic conjunctivitis. Will switch her Zyrtec for Xyzal. Continue Flonase. Start Optivar drops BID. PCP follow-up discussed.  ? ?Follow Up Instructions: ?I discussed the assessment and treatment plan with the patient. The patient was provided an opportunity to ask questions and all were answered. The patient agreed with the plan and demonstrated an understanding of the instructions.  A copy of instructions were sent to the patient via MyChart unless otherwise noted below.  ? ?The patient was advised to call back or seek an in-person evaluation if the symptoms worsen or if the condition fails to improve as anticipated. ? ?Time:  ?I spent 10 minutes with the patient via telehealth technology discussing the above problems/concerns.   ? ?[name], PA-C ?

## 2021-06-09 ENCOUNTER — Telehealth: Payer: Medicaid Other | Admitting: Family

## 2021-06-09 DIAGNOSIS — M25532 Pain in left wrist: Secondary | ICD-10-CM

## 2021-06-09 MED ORDER — PREDNISONE 20 MG PO TABS: 40.0000 mg | ORAL_TABLET | Freq: Every day | ORAL | 0 refills | Status: AC

## 2021-06-09 NOTE — Patient Instructions (Signed)
Wrist Pain, Adult There are many things that can cause wrist pain. Some common causes include: An injury to the wrist area, such as a sprain, strain, or fracture. Overuse of the joint. A condition that causes increased pressure on a nerve in the wrist (carpal tunnel syndrome). Wear and tear of the joints that occurs with aging (osteoarthritis). Other types of joint inflammation and stiffness (arthritis). Sometimes, the cause of wrist pain is not known. Often, the pain goes away when you follow instructions from your health care provider for relieving pain at home, such as resting the wrist, icing the wrist, or using a splint or an elastic wrap for a short time. If your wrist pain continues, it is important to tell your health care provider. Follow these instructions at home: If you have a splint or elastic wrap: Wear the splint or wrap as told by your health care provider. Remove it only as told by your health care provider. Ask your health care provider if you may remove it for bathing. Loosen the splint or wrap if your fingers tingle, become numb, or turn cold and blue. Check the skin around the splint or wrap every day. Tell your health care provider about any concerns. Keep the splint or wrap clean. If the splint or wrap is not waterproof: Do not let it get wet. Cover it with a watertight covering when you take a bath or shower. Managing pain, stiffness, and swelling  If directed, put ice on the painful area. To do this: If you have a removable splint or wrap, remove it as told by your health care provider. Put ice in a plastic bag. Place a towel between your skin and the bag or between your splint or wrap and the bag. Leave the ice on for 20 minutes, 2-3 times a day. Move your fingers often to reduce stiffness and swelling. Raise (elevate) the injured area above the level of your heart while you are sitting or lying down. Activity Rest your affected wrist as told by your health care  provider. Return to your normal activities as told by your health care provider. Ask your health care provider what activities are safe for you. Ask your health care provider when it is safe to drive if you have a splint or wrap on your wrist. Do exercises as told by your health care provider. General instructions Pay attention to any changes in your symptoms. Take over-the-counter and prescription medicines only as told by your health care provider. Keep all follow-up visits as told by your health care provider. This is important. Contact a health care provider if: You have a sudden, sharp pain in the wrist, hand, or arm that is different or new. The swelling or bruising on your wrist or hand gets worse. Your skin becomes red, gets a rash, or has open sores. Your pain does not get better or it gets worse. You have a fever or chills. Get help right away if: You lose feeling in your fingers or hand. Your fingers turn white, very red, or cold and blue. You cannot move your fingers. Summary Wrist pain in an adult has many different causes. If your wrist pain continues, it is important to tell your health care provider. You may need to wear a splint or an elastic wrap for a short period of time. Return to your normal activities as told by your health care provider. Ask your health care provider what activities are safe for you. This information is not intended   to replace advice given to you by your health care provider. Make sure you discuss any questions you have with your health care provider. Document Revised: 12/23/2018 Document Reviewed: 12/23/2018 Elsevier Patient Education  2023 Elsevier Inc.  

## 2021-06-09 NOTE — Progress Notes (Signed)
?Virtual Visit Consent  ? ?Dawn Dawn Horton, you are scheduled for a virtual visit with a [facility] provider today.   ?  ?Just as with appointments in the office, your consent must be obtained to participate.  Your consent will be active for this visit and any virtual visit you may have with one of our providers in the next 365 days.   ?  ?If you have a MyChart account, a copy of this consent can be sent to you electronically.  All virtual visits are billed to your insurance company just like a traditional visit in the office.   ? ?As this is a virtual visit, video technology does not allow for your provider to perform a traditional examination.  This may limit your provider's ability to fully assess your condition.  If your provider identifies any concerns that need to be evaluated in person or the need to arrange testing (such as labs, EKG, etc.), we will make arrangements to do so.   ?  ?Although advances in technology are sophisticated, we cannot ensure that it will always work on either your end or our end.  If the connection with a video visit is poor, the visit may have to be switched to a telephone visit.  With either a video or telephone visit, we are not always able to ensure that we have a secure connection.    ? ?Also, by engaging in this virtual visit, you consent to the provision of healthcare. Additionally, you authorize for your insurance to be billed (if applicable) for the services provided during this visit.  ? ?I need to obtain your verbal consent now.   Are you willing to proceed with your visit today?  ?  ?Dawn Dawn Horton has provided verbal consent on 06/09/2021 for a virtual visit (video or telephone). ?  ?Dawn Rodney, FNP  ? ?Date: 06/09/2021 9:40 AM ? ? ?Virtual Visit via Video Note  ? Dawn Dawn Horton, connected with  Dawn Dawn Horton  ([id number], 29-Mar-1985) on 06/09/21 at  9:30 AM EDT by a video-enabled telemedicine application and verified that I am speaking with the correct person using two  identifiers. ? ?Location: ?Patient: Virtual Visit Location Patient: car ?Provider: Virtual Visit Location Provider: Home Office ?  ?I discussed the limitations of evaluation and management by telemedicine and the availability of in person appointments. The patient expressed understanding and agreed to proceed.   ? ?History of Present Illness: ?Dawn Dawn Horton is a 36 y.o. who identifies as a female who was assigned female at birth, and is being seen today for left wrist pain that started aching yesterday. Reports during the night it worsen. She took tylenol with mild relief. Reports aching pain of 7-8 out 10. Denies any injury, but reports repetitive motion.  ? ?Denies any fever, warmth, or swelling.  ? ?HPI: HPI  ?Problems:  ?Patient Active Problem List  ? Diagnosis Date Noted  ? Bipolar affective disorder, current episode manic with psychotic symptoms (HCC) 05/21/2016  ?  ?Allergies:  ?Allergies  ?Allergen Reactions  ? Abilify [Aripiprazole] Other (See Comments)  ?  Makes pt delirious and forget things.   ? Ibuprofen Nausea Only  ? Penicillins Hives  ?  amoxicillin  ? ?Medications:  ?Current Outpatient Medications:  ?  predniSONE (DELTASONE) 20 MG tablet, Take 2 tablets (40 mg total) by mouth daily with breakfast for 5 days., Disp: 10 tablet, Rfl: 0 ?  azelastine (OPTIVAR) 0.05 % ophthalmic solution, Place 1 drop into both eyes 2 (two) times  daily., Disp: 6 mL, Rfl: 1 ?  fluticasone (FLONASE) 50 MCG/ACT nasal spray, Place 1 spray into both nostrils daily., Disp: 16 g, Rfl: 2 ?  folic acid (FOLVITE) 1 MG tablet, Take 1 mg by mouth daily., Disp: , Rfl:  ?  levocetirizine (XYZAL) 5 MG tablet, Take 1 tablet (5 mg total) by mouth every evening., Disp: 30 tablet, Rfl: 1 ?  lithium carbonate (ESKALITH) 450 MG CR tablet, Take 450 mg by mouth daily. , Disp: , Rfl:  ?  meloxicam (MOBIC) 15 MG tablet, Take 1 tablet (15 mg total) by mouth daily., Disp: 30 tablet, Rfl: 0 ?  methotrexate (RHEUMATREX) 2.5 MG tablet, Take by mouth.,  Disp: , Rfl:  ?  risperiDONE (RISPERDAL) 2 MG tablet, Take 1 tablet (2 mg total) by mouth at bedtime., Disp: 14 tablet, Rfl: 0 ? ?Observations/Objective: ?Patient is well-developed, well-nourished in no acute distress.  ?Resting comfortably  at home.  ?Head is normocephalic, atraumatic.  ?No labored breathing.  ?Speech is clear and coherent with logical content.  ?Patient is alert and oriented at baseline.  ?Negative phalen vs tinel ? ?Assessment and Plan: ?1. Left wrist pain ?- predniSONE (DELTASONE) 20 MG tablet; Take 2 tablets (40 mg total) by mouth daily with breakfast for 5 days.  Dispense: 10 tablet; Refill: 0 ? ?Continue mobic  ?Rest ?Work note given ?Wear wrist splint  ?Follow up if symptoms worsen or do not improve  ? ?Follow Up Instructions: ?I discussed the assessment and treatment plan with the patient. The patient was provided an opportunity to ask questions and all were answered. The patient agreed with the plan and demonstrated an understanding of the instructions.  A copy of instructions were sent to the patient via MyChart unless otherwise noted below.  ? ? ?The patient was advised to call back or seek an in-person evaluation if the symptoms worsen or if the condition fails to improve as anticipated. ? ?Time:  ?I spent 9 minutes with the patient via telehealth technology discussing the above problems/concerns.   ? ?Dawn Rodney, FNP ? ?

## 2021-06-16 ENCOUNTER — Telehealth: Payer: Medicaid Other | Admitting: Family

## 2021-06-16 DIAGNOSIS — M79672 Pain in left foot: Secondary | ICD-10-CM

## 2021-06-16 DIAGNOSIS — M25561 Pain in right knee: Secondary | ICD-10-CM | POA: Diagnosis not present

## 2021-06-16 DIAGNOSIS — M25551 Pain in right hip: Secondary | ICD-10-CM

## 2021-06-16 NOTE — Patient Instructions (Signed)
Joint Pain Joint pain may be caused by many things. Joint pain is likely to go away when you follow instructions from your health care provider for relieving pain at home. However, joint pain can also be caused by conditions that require more treatment. Common causes of joint pain include: Bruising in the area of the joint. Injury caused by repeating certain movements too many times. Age-related joint wear and tear. Buildup of uric acid crystals in the joint (gout). Inflammation of the joint. Other forms of arthritis. Infections of the joint or of the bone. Your health care provider may recommend that you take pain medicine or wear a supportive device like an elastic bandage, sling, or splint. If your joint pain continues, you may need lab or imaging tests to diagnose the cause of your joint pain. Follow these instructions at home: Managing pain, stiffness, and swelling     If directed, put ice on the painful area. To do this: If you have a removable elastic bandage, sling, or splint, take it off as told by your doctor. Put ice in a plastic bag. Place a towel between your skin and the bag. Leave the ice on for 20 minutes, 2-3 times a day. Remove the ice if your skin turns bright red. This is very important. If you cannot feel pain, heat, or cold, you have a greater risk of damage to the area. Move your fingers and toes often to reduce stiffness and swelling. Raise the injured area above the level of your heart while you are sitting or lying down. If directed, apply heat to the painful area as often as told by your health care provider. Use the heat source that your health care provider recommends, such as a moist heat pack or a heating pad. Place a towel between your skin and the heat source. Leave the heat on for 20-30 minutes. Remove the heat if your skin turns bright red. This is especially important if you are unable to feel pain, heat, or cold. You have a greater risk of getting  burned.  Activity Rest as told by your health care provider. Do not do anything that causes or worsens pain. Begin exercising or stretching the affected area as told by your health care provider. Return to your normal activities as told by your health care provider. Ask your health care provider what activities are safe for you. If you have an elastic bandage, sling, or splint: Wear the bandage, sling, or splint as told by your health care provider. Remove it only as told by your health care provider. Loosen it if your fingers or toes below the joint tingle, become numb, or turn cold and blue. Keep it clean. Ask your health care provider if you should remove it before bathing. If the bandage, sling, or splint is not waterproof: Do not let it get wet. Cover it with a watertight covering when you take a bath or shower. General instructions Treatment may include medicines for pain and inflammation that are taken by mouth or applied to the skin. Take over-the-counter and prescription medicines only as told by your health care provider. Do not use any products that contain nicotine or tobacco, such as cigarettes, e-cigarettes, and chewing tobacco. If you need help quitting, ask your health care provider. Keep all follow-up visits. This is important. Contact a health care provider if: You have pain that gets worse and does not get better with medicine. Your joint pain does not improve within 3 days. You have increased   bruising or swelling. You have a fever. You lose 10 lb (4.5 kg) or more without trying. Get help right away if: You cannot move the joint. Your fingers or toes tingle, become numb, or turn cold and blue. You have a fever along with a joint that is red, warm, and swollen. Summary Joint pain may be caused by many things. Your health care provider may recommend that you take pain medicine or wear a supportive device such as an elastic bandage, sling, or splint. If your joint pain  continues, you may need tests to diagnose the cause of your joint pain. Take over-the-counter and prescription medicines only as told by your health care provider. This information is not intended to replace advice given to you by your health care provider. Make sure you discuss any questions you have with your health care provider. Document Revised: 05/18/2019 Document Reviewed: 05/18/2019 Elsevier Patient Education  2023 Elsevier Inc.  

## 2021-06-16 NOTE — Progress Notes (Signed)
?Virtual Visit Consent  ? ?Dawn Horton, you are scheduled for a virtual visit with a [facility] provider today.   ?  ?Just as with appointments in the office, your consent must be obtained to participate.  Your consent will be active for this visit and any virtual visit you may have with one of our providers in the next 365 days.   ?  ?If you have a MyChart account, a copy of this consent can be sent to you electronically.  All virtual visits are billed to your insurance company just like a traditional visit in the office.   ? ?As this is a virtual visit, video technology does not allow for your provider to perform a traditional examination.  This may limit your provider's ability to fully assess your condition.  If your provider identifies any concerns that need to be evaluated in person or the need to arrange testing (such as labs, EKG, etc.), we will make arrangements to do so.   ?  ?Although advances in technology are sophisticated, we cannot ensure that it will always work on either your end or our end.  If the connection with a video visit is poor, the visit may have to be switched to a telephone visit.  With either a video or telephone visit, we are not always able to ensure that we have a secure connection.    ? ?Also, by engaging in this virtual visit, you consent to the provision of healthcare. Additionally, you authorize for your insurance to be billed (if applicable) for the services provided during this visit.  ? ?I need to obtain your verbal consent now.   Are you willing to proceed with your visit today?  ?  ?Dawn Horton has provided verbal consent on 06/16/2021 for a virtual visit (video or telephone). ?  ?[name], FNP  ? ?Date: 06/16/2021 8:47 AM ? ? ?Virtual Visit via Video Note  ? [name], connected with  Dawn Horton  ([id number], 04-19-1985) on 06/16/21 at  8:30 AM EDT by a video-enabled telemedicine application and verified that I am speaking with the correct person using two  identifiers. ? ?Location: ?Patient: Virtual Visit Location Patient: Home ?Provider: Virtual Visit Location Provider: Home Office ?  ?I discussed the limitations of evaluation and management by telemedicine and the availability of in person appointments. The patient expressed understanding and agreed to proceed.   ? ?History of Present Illness: ?Dawn Horton is a 36 y.o. who identifies as a female who was assigned female at birth, and is being seen today for left foot soreness that started a few weeks. She stands on concrete for 8 hours. She is also complaining of right knee and right hip pain. Denies any injury she is aware.  ? ?She has been taking Aleve and tylenol with mild relief. She is currently taking methotrexate and states she can not tolerate steroids. ? ? ?HPI: HPI  ?Problems:  ?Patient Active Problem List  ? Diagnosis Date Noted  ? Bipolar affective disorder, current episode manic with psychotic symptoms (HCC) 05/21/2016  ?  ?Allergies:  ?Allergies  ?Allergen Reactions  ? Abilify [Aripiprazole] Other (See Comments)  ?  Makes pt delirious and forget things.   ? Ibuprofen Nausea Only  ? Penicillins Hives  ?  amoxicillin  ? ?Medications:  ?Current Outpatient Medications:  ?  azelastine (OPTIVAR) 0.05 % ophthalmic solution, Place 1 drop into both eyes 2 (two) times daily., Disp: 6 mL, Rfl: 1 ?  fluticasone (FLONASE) 50  MCG/ACT nasal spray, Place 1 spray into both nostrils daily., Disp: 16 g, Rfl: 2 ?  folic acid (FOLVITE) 1 MG tablet, Take 1 mg by mouth daily., Disp: , Rfl:  ?  levocetirizine (XYZAL) 5 MG tablet, Take 1 tablet (5 mg total) by mouth every evening., Disp: 30 tablet, Rfl: 1 ?  lithium carbonate (ESKALITH) 450 MG CR tablet, Take 450 mg by mouth daily. , Disp: , Rfl:  ?  methotrexate (RHEUMATREX) 2.5 MG tablet, Take by mouth., Disp: , Rfl:  ?  risperiDONE (RISPERDAL) 2 MG tablet, Take 1 tablet (2 mg total) by mouth at bedtime., Disp: 14 tablet, Rfl: 0 ? ?Observations/Objective: ?Patient is  well-developed, well-nourished in no acute distress.  ?Resting comfortably  at home.  ?Head is normocephalic, atraumatic.  ?No labored breathing.  ?Speech is clear and coherent with logical content.  ?Patient is alert and oriented at baseline.  ? ? ?Assessment and Plan: ?1. Left foot pain ? ?2. Acute pain of right knee ? ?3. Right hip pain ? ?Continue Aleve and tylenol  ?Make sure you wear good shoe support ?Follow up with PCP ?Work note given for today ? ?Follow Up Instructions: ?I discussed the assessment and treatment plan with the patient. The patient was provided an opportunity to ask questions and all were answered. The patient agreed with the plan and demonstrated an understanding of the instructions.  A copy of instructions were sent to the patient via MyChart unless otherwise noted below.  ? ? ?The patient was advised to call back or seek an in-person evaluation if the symptoms worsen or if the condition fails to improve as anticipated. ? ?Time:  ?I spent 11 minutes with the patient via telehealth technology discussing the above problems/concerns.   ? ?[name], FNP ? ?

## 2021-07-01 ENCOUNTER — Telehealth: Payer: Medicaid Other | Admitting: Physician Assistant

## 2021-07-01 DIAGNOSIS — R110 Nausea: Secondary | ICD-10-CM | POA: Diagnosis not present

## 2021-07-01 MED ORDER — ONDANSETRON 4 MG PO TBDP: 4.0000 mg | ORAL_TABLET | Freq: Three times a day (TID) | ORAL | 0 refills | Status: DC | PRN

## 2021-07-01 NOTE — Progress Notes (Signed)
?Virtual Visit Consent  ? ?Dawn Horton, you are scheduled for a virtual visit with a [facility] provider today. Just as with appointments in the office, your consent must be obtained to participate. Your consent will be active for this visit and any virtual visit you may have with one of our providers in the next 365 days. If you have a MyChart account, a copy of this consent can be sent to you electronically. ? ?As this is a virtual visit, video technology does not allow for your provider to perform a traditional examination. This may limit your provider's ability to fully assess your condition. If your provider identifies any concerns that need to be evaluated in person or the need to arrange testing (such as labs, EKG, etc.), we will make arrangements to do so. Although advances in technology are sophisticated, we cannot ensure that it will always work on either your end or our end. If the connection with a video visit is poor, the visit may have to be switched to a telephone visit. With either a video or telephone visit, we are not always able to ensure that we have a secure connection. ? ?By engaging in this virtual visit, you consent to the provision of healthcare and authorize for your insurance to be billed (if applicable) for the services provided during this visit. Depending on your insurance coverage, you may receive a charge related to this service. ? ?I need to obtain your verbal consent now. Are you willing to proceed with your visit today? Dawn Horton has provided verbal consent on 07/01/2021 for a virtual visit (video or telephone). Dawn Climes, PA-C ? ?Date: 07/01/2021 1:00 PM ? ?Virtual Visit via Video Note  ? ?IPiedad Horton, connected with  Charene Mccallister  ([id number], 04-Apr-1985) on 07/01/21 at  1:00 PM EDT by a video-enabled telemedicine application and verified that I am speaking with the correct person using two identifiers. ? ?Location: ?Patient: Virtual Visit Location Patient:  Home ?Provider: Virtual Visit Location Provider: Home Office ?  ?I discussed the limitations of evaluation and management by telemedicine and the availability of in person appointments. The patient expressed understanding and agreed to proceed.   ? ?History of Present Illness: ?Dawn Horton is a 36 y.o. who identifies as a female who was assigned female at birth, and is being seen today for GI symptoms. Patient endorses being on methotrexate 2.5 mg daily since last November from her specialist. Notes that with each weekly dose she gets significant nausea for a few days afterwards and occasionally with loose stool. Notes this happened with her dose yesterday morning and symptoms have been significant overnight so she had to call out of work this morning. Denies any fever, chills, vomiting, melena or hematochezia. .  ? ?HPI: HPI  ?Problems:  ?Patient Active Problem List  ? Diagnosis Date Noted  ? Bipolar affective disorder, current episode manic with psychotic symptoms (HCC) 05/21/2016  ?  ?Allergies:  ?Allergies  ?Allergen Reactions  ? Abilify [Aripiprazole] Other (See Comments)  ?  Makes pt delirious and forget things.   ? Ibuprofen Nausea Only  ? Penicillins Hives  ?  amoxicillin  ? ?Medications:  ?Current Outpatient Medications:  ?  ondansetron (ZOFRAN-ODT) 4 MG disintegrating tablet, Take 1 tablet (4 mg total) by mouth every 8 (eight) hours as needed for nausea or vomiting., Disp: 20 tablet, Rfl: 0 ?  azelastine (OPTIVAR) 0.05 % ophthalmic solution, Place 1 drop into both eyes 2 (two) times daily., Disp: 6  mL, Rfl: 1 ?  fluticasone (FLONASE) 50 MCG/ACT nasal spray, Place 1 spray into both nostrils daily., Disp: 16 g, Rfl: 2 ?  folic acid (FOLVITE) 1 MG tablet, Take 1 mg by mouth daily., Disp: , Rfl:  ?  levocetirizine (XYZAL) 5 MG tablet, Take 1 tablet (5 mg total) by mouth every evening., Disp: 30 tablet, Rfl: 1 ?  lithium carbonate (ESKALITH) 450 MG CR tablet, Take 450 mg by mouth daily. , Disp: , Rfl:  ?   methotrexate (RHEUMATREX) 2.5 MG tablet, Take by mouth., Disp: , Rfl:  ?  risperiDONE (RISPERDAL) 2 MG tablet, Take 1 tablet (2 mg total) by mouth at bedtime., Disp: 14 tablet, Rfl: 0 ? ?Observations/Objective: ?Patient is well-developed, well-nourished in no acute distress.  ?Resting comfortably at home.  ?Head is normocephalic, atraumatic.  ?No labored breathing. ?Speech is clear and coherent with logical content.  ?Patient is alert and oriented at baseline.  ? ?Assessment and Plan: ?1. Nausea ?- ondansetron (ZOFRAN-ODT) 4 MG disintegrating tablet; Take 1 tablet (4 mg total) by mouth every 8 (eight) hours as needed for nausea or vomiting.  Dispense: 20 tablet; Refill: 0 ? ?Secondary to her methotrexate, along with loose stool. This is a weekly occurrence since starting medication last fall. Want her to discuss this with the prescribing provider for further management and medication adjustments. Will have her follow bland diet today along with starting a daily probiotic to continue ongoing. Will give small script for Zofran if needed. Work note written for today.  ? ?Follow Up Instructions: ?I discussed the assessment and treatment plan with the patient. The patient was provided an opportunity to ask questions and all were answered. The patient agreed with the plan and demonstrated an understanding of the instructions.  A copy of instructions were sent to the patient via MyChart unless otherwise noted below.  ? ?The patient was advised to call back or seek an in-person evaluation if the symptoms worsen or if the condition fails to improve as anticipated. ? ?Time:  ?I spent 10 minutes with the patient via telehealth technology discussing the above problems/concerns.   ? ?Dawn Climes, PA-C ?

## 2021-07-01 NOTE — Patient Instructions (Signed)
?[name], thank you for joining [name], PA-C for today's virtual visit.  While this provider is not your primary care provider (PCP), if your PCP is located in our provider database this encounter information will be shared with them immediately following your visit. ? ?Consent: ?(Patient) Dawn Horton provided verbal consent for this virtual visit at the beginning of the encounter. ? ?Current Medications: ? ?Current Outpatient Medications:  ?  azelastine (OPTIVAR) 0.05 % ophthalmic solution, Place 1 drop into both eyes 2 (two) times daily., Disp: 6 mL, Rfl: 1 ?  fluticasone (FLONASE) 50 MCG/ACT nasal spray, Place 1 spray into both nostrils daily., Disp: 16 g, Rfl: 2 ?  folic acid (FOLVITE) 1 MG tablet, Take 1 mg by mouth daily., Disp: , Rfl:  ?  levocetirizine (XYZAL) 5 MG tablet, Take 1 tablet (5 mg total) by mouth every evening., Disp: 30 tablet, Rfl: 1 ?  lithium carbonate (ESKALITH) 450 MG CR tablet, Take 450 mg by mouth daily. , Disp: , Rfl:  ?  methotrexate (RHEUMATREX) 2.5 MG tablet, Take by mouth., Disp: , Rfl:  ?  risperiDONE (RISPERDAL) 2 MG tablet, Take 1 tablet (2 mg total) by mouth at bedtime., Disp: 14 tablet, Rfl: 0  ? ?Medications ordered in this encounter:  ?No orders of the defined types were placed in this encounter. ?  ? ?*If you need refills on other medications prior to your next appointment, please contact your pharmacy* ? ?Follow-Up: ?Call back or seek an in-person evaluation if the symptoms worsen or if the condition fails to improve as anticipated. ? ?Other Instructions ?Please follow-up with your prescribing provider as discussed for ongoing management.  ?Start a probiotic daily.  ?The zofran is to use as directed, if needed for nausea today.  ? ?Bland Diet ?A bland diet may consist of soft foods or foods that are not high in fat or are not greasy, acidic, or spicy. Avoiding certain foods may cause less irritation to your mouth, throat, stomach, or gastrointestinal tract.  Avoiding certain foods may make you feel better. Everyone's tolerances are different. A bland diet should be based on what you can tolerate and what may cause discomfort. ?What is my plan? ?Your health care provider or dietitian may recommend specific changes to your diet to treat your symptoms. These changes may include: ?Eating small meals frequently. ?Cooking food until it is soft enough to chew easily. ?Taking the time to chew your food thoroughly, so it is easy to swallow and digest. ?Avoiding foods that cause you discomfort. These may include spicy food, fried food, greasy foods, hard-to-chew foods, or citrus fruits and juices. ?Drinking slowly. ?What are tips for following this plan? ?Reading food labels ?To reduce fiber intake, look for food labels that say "whole," such as whole wheat or whole grain. ?Shopping ?Avoid food items that may have nuts or seeds. ?Avoid vegetables that may make you gassy or have a tough texture, such as broccoli, cauliflower, or corn. ?Cooking ?Adriana [facility]as foods thoroughly so they have a soft texture. ?Meal planning ?Make sure you include foods from all food groups to eat a balanced diet. ?Eat a variety of types of foods. ?Eat foods and drink beverages that do not cause you discomfort. These may include soups and broths with cooked meats, pasta, and vegetables. ?Lifestyle ?Sit up after meals, avoid tight clothing, and take time to eat and chew your food slowly. ?Ask your health care provider whether you should take dietary supplements. ?General information ?Mildly season your foods.  Some seasonings, such as cayenne pepper, vinegar, or hot sauce, may cause irritation. ?The foods, beverages, or seasonings to avoid should be based on individual tolerance. ?What foods should I eat? ?Fruits ?Canned or cooked fruit such as peaches, pears, or applesauce. Bananas. ?Vegetables ?Well-cooked vegetables. Canned or cooked vegetables such as carrots, green beans, beets, or spinach. Mashed or boiled  potatoes. ?Grains ? ?Hot cereals, such as cream of wheat and processed oatmeal. Rice. Bread, crackers, pasta, or tortillas made from refined white flour. ?Meats and other proteins ? ?Eggs. Creamy peanut butter or other nut butters. Lean, well-cooked tender meats, such as beef, pork, chicken, or fish. ?Dairy ?Low-fat dairy products such as milk, cottage cheese, or yogurt. ?Beverages ? ?Water. Herbal tea. Apple juice. ?Fats and oils ?Mild salad dressings. Canola or olive oil. ?Sweets and desserts ?Low-fat pudding, custard, or ice cream. Fruit gelatin. ?The items listed above may not be a complete list of foods and beverages you can eat. Contact a dietitian for more information. ?What foods should I avoid? ?Fruits ?Citrus fruits, such as oranges and grapefruit. Fruits with a stringy texture. Fruits that have lots of seeds, such as kiwi or strawberries. Dried fruits. ?Vegetables ?Raw, uncooked vegetables. Salads. ?Grains ?Whole grain breads, muffins, and cereals. ?Meats and other proteins ?Tough, fibrous meats. Highly seasoned meat such as corned beef, smoked meats, or fish. Processed high-fat meats such as brats, hot dogs, or sausage. ?Dairy ?Full-fat dairy foods such as ice cream and cheese. ?Beverages ?Caffeinated drinks. Alcohol. ?Seasonings and condiments ?Strongly flavored seasonings or condiments. Hot sauce. Salsa. ?Other foods ?Spicy foods. Fried or greasy foods. Sour foods, such as pickled or fermented foods like sauerkraut. Foods high in fiber. ?The items listed above may not be a complete list of foods and beverages you should avoid. Contact a dietitian for more information. ?Summary ?A bland diet should be based on individual tolerance. It may consist of foods that are soft textured and do not have a lot of fat, fiber, acid, or seasonings. ?A bland diet may be recommended because avoiding certain foods, beverages, or spices may make you feel better. ?This information is not intended to replace advice given  to you by your health care provider. Make sure you discuss any questions you have with your health care provider. ?Document Revised: 12/24/2020 Document Reviewed: 12/24/2020 ?Elsevier Patient Education ? 2023 Elsevier Inc. ? ? ? ?If you have been instructed to have an in-person evaluation today at a local Urgent Care facility, please use the link below. It will take you to a list of all of our available [facility], including address, phone number and hours of operation. Please do not delay care.  ?[facility] ? ?If you or a family member do not have a primary care provider, use the link below to schedule a visit and establish care. When you choose a [facility] primary care physician or advanced practice provider, you gain a long-term partner in health. ?Find a Primary Care Provider ? ?Learn more about [facility] in-office and virtual care options: ?[facility] Get Care Now  ?

## 2021-07-06 DIAGNOSIS — F319 Bipolar disorder, unspecified: Secondary | ICD-10-CM | POA: Insufficient documentation

## 2021-07-06 DIAGNOSIS — F419 Anxiety disorder, unspecified: Secondary | ICD-10-CM | POA: Insufficient documentation

## 2021-07-06 DIAGNOSIS — F32A Depression, unspecified: Secondary | ICD-10-CM | POA: Insufficient documentation

## 2021-07-06 DIAGNOSIS — F4310 Post-traumatic stress disorder, unspecified: Secondary | ICD-10-CM | POA: Insufficient documentation

## 2021-07-06 DIAGNOSIS — F250 Schizoaffective disorder, bipolar type: Secondary | ICD-10-CM | POA: Insufficient documentation

## 2021-07-29 DIAGNOSIS — R011 Cardiac murmur, unspecified: Secondary | ICD-10-CM | POA: Insufficient documentation

## 2021-07-29 DIAGNOSIS — M779 Enthesopathy, unspecified: Secondary | ICD-10-CM | POA: Insufficient documentation

## 2021-08-22 ENCOUNTER — Ambulatory Visit (INDEPENDENT_AMBULATORY_CARE_PROVIDER_SITE_OTHER): Payer: Medicaid Other

## 2021-08-22 ENCOUNTER — Ambulatory Visit (HOSPITAL_COMMUNITY)
Admission: EM | Admit: 2021-08-22 | Discharge: 2021-08-22 | Disposition: A | Discharge status: Home / Self Care | Payer: Medicaid Other | Attending: Internal Medicine | Admitting: Internal Medicine

## 2021-08-22 ENCOUNTER — Encounter (HOSPITAL_COMMUNITY): Payer: Self-pay | Admitting: *Deleted

## 2021-08-22 ENCOUNTER — Other Ambulatory Visit: Payer: Self-pay

## 2021-08-22 DIAGNOSIS — M79641 Pain in right hand: Secondary | ICD-10-CM

## 2021-08-22 DIAGNOSIS — M79644 Pain in right finger(s): Secondary | ICD-10-CM | POA: Diagnosis not present

## 2021-08-22 DIAGNOSIS — S60221A Contusion of right hand, initial encounter: Secondary | ICD-10-CM

## 2021-08-22 MED ORDER — HYDROCODONE-ACETAMINOPHEN 5-325 MG PO TABS: 1.0000 | ORAL_TABLET | Freq: Every evening | ORAL | 0 refills | Status: AC | PRN

## 2021-08-22 NOTE — ED Provider Notes (Signed)
[facility]    CSN: [id number] Arrival date & time: 08/22/21  1613      History   Chief Complaint Chief Complaint  Patient presents with   Hand Injury    HPI Dawn Horton is a 36 y.o. female.   Patient presents today with a 4-day history of right hand pain.  Reports that she works at [employer] and picked up a package to flip it over which landed on her right hand causing immediate pain in her hand and wrist.  The wrist pain has improved but she continues to have hand pain.  Pain is localized over the third and fourth metatarsal with associated swelling.  It is rated 9 on a 0-10 pain scale, described as throbbing, no aggravating or alleviating factors identified.  She has tried Tylenol as well as Aleve without improvement of symptoms.  She is right-handed.  Denies any numbness or paresthesias.  She denies previous injury or surgery involving her hand.  She is confident that she is not pregnant.    Past Medical History:  Diagnosis Date   Anemia    Bipolar 1 disorder (HCC)    Depression    GERD (gastroesophageal reflux disease)    Tendinitis     Patient Active Problem List   Diagnosis Date Noted   Bipolar affective disorder, current episode manic with psychotic symptoms (HCC) 05/21/2016    Past Surgical History:  Procedure Laterality Date   CESAREAN SECTION      OB History     Gravida  3   Para  2   Term  2   Preterm  0   AB  1   Living  2      SAB  1   IAB  0   Ectopic  0   Multiple  0   Live Births  2            Home Medications    Prior to Admission medications   Medication Sig Start Date End Date Taking? Authorizing Provider  HYDROcodone-acetaminophen (NORCO/VICODIN) 5-325 MG tablet Take 1 tablet by mouth at bedtime as needed for up to 3 days. 08/22/21 08/25/21 Yes Eduar Kumpf K, PA-C  azelastine (OPTIVAR) 0.05 % ophthalmic solution Place 1 drop into both eyes 2 (two) times daily. 05/20/21   Waldon Merl, PA-C  fluticasone (FLONASE)  50 MCG/ACT nasal spray Place 1 spray into both nostrils daily. 04/15/21   Madelyn Brunner, DO  folic acid (FOLVITE) 1 MG tablet Take 1 mg by mouth daily. 09/12/20   [provider]  levocetirizine (XYZAL) 5 MG tablet Take 1 tablet (5 mg total) by mouth every evening. 05/20/21   Waldon Merl, PA-C  lithium carbonate (ESKALITH) 450 MG CR tablet Take 450 mg by mouth daily.     [provider]  methotrexate (RHEUMATREX) 2.5 MG tablet Take by mouth. 10/17/20   [provider]  ondansetron (ZOFRAN-ODT) 4 MG disintegrating tablet Take 1 tablet (4 mg total) by mouth every 8 (eight) hours as needed for nausea or vomiting. 07/01/21   Waldon Merl, PA-C  risperiDONE (RISPERDAL) 2 MG tablet Take 1 tablet (2 mg total) by mouth at bedtime. 05/20/16   Roxy Horseman, PA-C  traZODone (DESYREL) 25 mg TABS tablet Take 50 mg by mouth at bedtime.  11/08/19  [provider]    Family History Family History  Problem Relation Age of Onset   Healthy Mother    Healthy Father     Social  History Social History   Tobacco Use   Smoking status: Every Day    Packs/day: 1.00    Years: 15.00    Total pack years: 15.00    Types: Cigarettes   Smokeless tobacco: Never  Substance Use Topics   Alcohol use: Not Currently    Comment: Occ   Drug use: Not Currently    Types: Marijuana    Comment: no longer using     Allergies   Abilify [aripiprazole], Ibuprofen, and Penicillins   Review of Systems Review of Systems  Constitutional:  Positive for activity change. Negative for appetite change, fatigue and fever.  Gastrointestinal:  Negative for abdominal pain, diarrhea, nausea and vomiting.  Musculoskeletal:  Positive for arthralgias and joint swelling. Negative for myalgias.  Neurological:  Negative for dizziness, weakness, light-headedness, numbness and headaches.     Physical Exam Triage Vital Signs ED Triage Vitals  Enc Vitals Group     BP 08/22/21 1659 109/78      Pulse Rate 08/22/21 1659 87     Resp 08/22/21 1659 18     Temp 08/22/21 1659 99.5 F (37.5 C)     Temp src --      SpO2 08/22/21 1659 95 %     Weight --      Height --      Head Circumference --      Peak Flow --      Pain Score 08/22/21 1658 9     Pain Loc --      Pain Edu? --      Excl. in GC? --    No data found.  Updated Vital Signs BP 109/78   Pulse 87   Temp 99.5 F (37.5 C)   Resp 18   SpO2 95%   Visual Acuity Right Eye Distance:   Left Eye Distance:   Bilateral Distance:    Right Eye Near:   Left Eye Near:    Bilateral Near:     Physical Exam Vitals reviewed.  Constitutional:      General: She is awake. She is not in acute distress.    Appearance: Normal appearance. She is well-developed. She is not ill-appearing.     Comments: Very pleasant female appears stated age in no acute distress sitting comfortably in exam room  HENT:     Head: Normocephalic and atraumatic.  Cardiovascular:     Rate and Rhythm: Normal rate and regular rhythm.     Heart sounds: Normal heart sounds, S1 normal and S2 normal. No murmur heard.    Comments: Capillary refill within 2 seconds bilateral fingers Pulmonary:     Effort: Pulmonary effort is normal.     Breath sounds: Normal breath sounds. No wheezing, rhonchi or rales.  Musculoskeletal:     Right hand: Swelling and tenderness present. No deformity or bony tenderness. Decreased range of motion. Normal strength. There is no disruption of two-point discrimination. Normal capillary refill.     Comments: Right hand: Swelling and tenderness over third and fourth metacarpal.  No deformity noted.  Hand neurovascularly intact.  Psychiatric:        Behavior: Behavior is cooperative.      UC Treatments / Results  Labs (all labs ordered are listed, but only abnormal results are displayed) Labs Reviewed - No data to display  EKG   Radiology DG Hand Complete Right  Result Date: 08/22/2021 CLINICAL DATA:  Right hand injury at  work. Greatest pain at the third metacarpal phalangeal joint. EXAM:  RIGHT HAND - COMPLETE 3+ VIEW COMPARISON:  None Available. FINDINGS: There is no evidence of fracture or dislocation. There is no evidence of arthropathy or other focal bone abnormality. Soft tissue edema over the dorsum of the hand. IMPRESSION: Soft tissue edema. No fracture or dislocation. Electronically Signed   By: Narda Rutherford M.D.   On: 08/22/2021 17:19    Procedures Procedures (including critical care time)  Medications Ordered in UC Medications - No data to display  Initial Impression / Assessment and Plan / UC Course  I have reviewed the triage vital signs and the nursing notes.  Pertinent labs & imaging results that were available during my care of the patient were reviewed by me and considered in my medical decision making (see chart for details).     X-ray obtained given mechanism of injury showed no acute osseous abnormality but did show soft tissue swelling.  Discussed contusion as likely etiology of symptoms.  Ace wrap was applied to help with providing compression to manage swelling.  Also recommended RICE protocol including regular icing.  She has been taking Aleve and Tylenol without improvement of symptoms.  She was sent in 3 doses of hydrocodone to help manage pain at night with instruction to drive drink alcohol while taking this medication.  Review of [place] controlled substance database shows no inappropriate refill.  She can continue using Aleve and Tylenol throughout the day.  Recommended avoiding strenuous activity and was given work excuse note.  If symptoms are not improving she is to follow-up with orthopedics and was given contact information for local provider with instruction to call to schedule an appointment.  If she has any worsening symptoms she needs to be seen immediately to which she expressed understanding.  Final Clinical Impressions(s) / UC Diagnoses   Final diagnoses:   Contusion of right hand, initial encounter  Right hand pain     Discharge Instructions      There was no fracture which is great news.  Keep your hand elevated and use the Ace wrap to provide compression.  Ice for at least 15 minutes per 2 hours.  Follow-up with hand specialist if symptoms not improving.  I called in a few doses of hydrocodone to help you sleep.  Do not drive or drink alcohol with taking this medication.  You can use Tylenol and Aleve over-the-counter for additional symptom relief.  If you have any worsening symptoms including increased pain, numbness, tingling should be seen immediately.  If symptoms are not improving please follow-up with hand specialist; call to schedule an appointment.     ED Prescriptions     Medication Sig Dispense Auth. Provider   HYDROcodone-acetaminophen (NORCO/VICODIN) 5-325 MG tablet Take 1 tablet by mouth at bedtime as needed for up to 3 days. 3 tablet Athena Baltz K, PA-C      I have reviewed the PDMP during this encounter.   Jeani Hawking, PA-C 08/22/21 1741

## 2021-08-22 NOTE — ED Triage Notes (Signed)
Pt reports she hurt her Rt Hand at work on Monday.

## 2021-08-22 NOTE — Discharge Instructions (Signed)
There was no fracture which is great news.  Keep your hand elevated and use the Ace wrap to provide compression.  Ice for at least 15 minutes per 2 hours.  Follow-up with hand specialist if symptoms not improving.  I called in a few doses of hydrocodone to help you sleep.  Do not drive or drink alcohol with taking this medication.  You can use Tylenol and Aleve over-the-counter for additional symptom relief.  If you have any worsening symptoms including increased pain, numbness, tingling should be seen immediately.  If symptoms are not improving please follow-up with hand specialist; call to schedule an appointment.

## 2021-08-26 ENCOUNTER — Telehealth: Payer: Medicaid Other | Admitting: Physician Assistant

## 2021-08-26 ENCOUNTER — Ambulatory Visit (HOSPITAL_COMMUNITY): Payer: Medicaid Other

## 2021-08-26 ENCOUNTER — Telehealth: Payer: Self-pay | Admitting: Physician Assistant

## 2021-08-26 DIAGNOSIS — S6721XD Crushing injury of right hand, subsequent encounter: Secondary | ICD-10-CM | POA: Diagnosis not present

## 2021-08-26 NOTE — Progress Notes (Signed)
Virtual Visit Consent   Dawn Horton, you are scheduled for a virtual visit with a [facility] provider today. Just as with appointments in the office, your consent must be obtained to participate. Your consent will be active for this visit and any virtual visit you may have with one of our providers in the next 365 days. If you have a MyChart account, a copy of this consent can be sent to you electronically.  As this is a virtual visit, video technology does not allow for your provider to perform a traditional examination. This may limit your provider's ability to fully assess your condition. If your provider identifies any concerns that need to be evaluated in person or the need to arrange testing (such as labs, EKG, etc.), we will make arrangements to do so. Although advances in technology are sophisticated, we cannot ensure that it will always work on either your end or our end. If the connection with a video visit is poor, the visit may have to be switched to a telephone visit. With either a video or telephone visit, we are not always able to ensure that we have a secure connection.  By engaging in this virtual visit, you consent to the provision of healthcare and authorize for your insurance to be billed (if applicable) for the services provided during this visit. Depending on your insurance coverage, you may receive a charge related to this service.  I need to obtain your verbal consent now. Are you willing to proceed with your visit today? Dawn Horton has provided verbal consent on 08/26/2021 for a virtual visit (video or telephone). [name], PA-C  Date: 08/26/2021 1:05 PM  Virtual Visit via Video Note   IMargaretann Loveless, connected with  Dawn Horton  ([id number], February 10, 1986) on 08/26/21 at  1:00 PM EDT by a video-enabled telemedicine application and verified that I am speaking with the correct person using two identifiers.  Location: Patient: Virtual Visit Location Patient:  Home Provider: Virtual Visit Location Provider: Home Office   I discussed the limitations of evaluation and management by telemedicine and the availability of in person appointments. The patient expressed understanding and agreed to proceed.    History of Present Illness: Dawn Horton is a 36 y.o. who identifies as a female who was assigned female at birth, and is being seen today for work note. Patient did virtual this morning and was advised she would need to be seen in person at [facility] or at orthopedic urgent care for worker's comp claim for right hand injury. It was crushed by a stack of magazines on 08/22/21. She was seen in person at [facility] and had Xray that showed contusion/soft tissue swelling. Patient states she knows this visit will not work towards her worker's comp claim but is requesting a work note for today and tomorrow for her hand.   Problems:  Patient Active Problem List   Diagnosis Date Noted   Bipolar affective disorder, current episode manic with psychotic symptoms (HCC) 05/21/2016    Allergies:  Allergies  Allergen Reactions   Abilify [Aripiprazole] Other (See Comments)    Makes pt delirious and forget things.    Ibuprofen Nausea Only   Penicillins Hives    amoxicillin   Medications:  Current Outpatient Medications:    azelastine (OPTIVAR) 0.05 % ophthalmic solution, Place 1 drop into both eyes 2 (two) times daily., Disp: 6 mL, Rfl: 1   fluticasone (FLONASE) 50 MCG/ACT nasal spray, Place 1 spray into both nostrils daily.,  Disp: 16 g, Rfl: 2   folic acid (FOLVITE) 1 MG tablet, Take 1 mg by mouth daily., Disp: , Rfl:    levocetirizine (XYZAL) 5 MG tablet, Take 1 tablet (5 mg total) by mouth every evening., Disp: 30 tablet, Rfl: 1   lithium carbonate (ESKALITH) 450 MG CR tablet, Take 450 mg by mouth daily. , Disp: , Rfl:    methotrexate (RHEUMATREX) 2.5 MG tablet, Take by mouth., Disp: , Rfl:    ondansetron (ZOFRAN-ODT) 4 MG disintegrating tablet, Take 1 tablet (4 mg total)  by mouth every 8 (eight) hours as needed for nausea or vomiting., Disp: 20 tablet, Rfl: 0   risperiDONE (RISPERDAL) 2 MG tablet, Take 1 tablet (2 mg total) by mouth at bedtime., Disp: 14 tablet, Rfl: 0  Observations/Objective: Patient is well-developed, well-nourished in no acute distress.  Resting comfortably at home.  Head is normocephalic, atraumatic.  No labored breathing.  Speech is clear and coherent with logical content.  Patient is alert and oriented at baseline.    Assessment and Plan: 1. Crushing injury of right hand, subsequent encounter  - Reiterated to patient she would not be able to use this visit or these 2 dates for a Worker's comp claim with having it done virtually as we had discussed this morning - She reports she understands and would still like to have to work note - Work note provided for today and tomorrow - Continue conservative management with NSAIDs, ice, and compression - Advised she would need to seek in person evaluation if still not improving  Follow Up Instructions: I discussed the assessment and treatment plan with the patient. The patient was provided an opportunity to ask questions and all were answered. The patient agreed with the plan and demonstrated an understanding of the instructions.  A copy of instructions were sent to the patient via MyChart unless otherwise noted below.    The patient was advised to call back or seek an in-person evaluation if the symptoms worsen or if the condition fails to improve as anticipated.  Time:  I spent 8 minutes with the patient via telehealth technology discussing the above problems/concerns.    [name], PA-C

## 2021-08-26 NOTE — Patient Instructions (Signed)
  Allysen Peretti, thank you for joining Korine Winton M Oniya Mandarino, PA-C for today's virtual visit.  While this provider is not your primary care provider (PCP), if your PCP is located in our provider database this encounter information will be shared with them immediately following your visit.  Consent: (Patient) Dawn Horton provided verbal consent for this virtual visit at the beginning of the encounter.  Current Medications:  Current Outpatient Medications:    azelastine (OPTIVAR) 0.05 % ophthalmic solution, Place 1 drop into both eyes 2 (two) times daily., Disp: 6 mL, Rfl: 1   fluticasone (FLONASE) 50 MCG/ACT nasal spray, Place 1 spray into both nostrils daily., Disp: 16 g, Rfl: 2   folic acid (FOLVITE) 1 MG tablet, Take 1 mg by mouth daily., Disp: , Rfl:    levocetirizine (XYZAL) 5 MG tablet, Take 1 tablet (5 mg total) by mouth every evening., Disp: 30 tablet, Rfl: 1   lithium carbonate (ESKALITH) 450 MG CR tablet, Take 450 mg by mouth daily. , Disp: , Rfl:    methotrexate (RHEUMATREX) 2.5 MG tablet, Take by mouth., Disp: , Rfl:    ondansetron (ZOFRAN-ODT) 4 MG disintegrating tablet, Take 1 tablet (4 mg total) by mouth every 8 (eight) hours as needed for nausea or vomiting., Disp: 20 tablet, Rfl: 0   risperiDONE (RISPERDAL) 2 MG tablet, Take 1 tablet (2 mg total) by mouth at bedtime., Disp: 14 tablet, Rfl: 0   Medications ordered in this encounter:  No orders of the defined types were placed in this encounter.    *If you need refills on other medications prior to your next appointment, please contact your pharmacy*  Follow-Up: Call back or seek an in-person evaluation if the symptoms worsen or if the condition fails to improve as anticipated.  Other Instructions [facility] [address] [place], [place] [address] URGENT CARE HOURS Monday - Friday: 8:00am to 8:00pm Saturday: 10:00am to 3:00pm [phone]    If you have been instructed to have an in-person evaluation  today at a local Urgent Care facility, please use the link below. It will take you to a list of all of our available [facility], including address, phone number and hours of operation. Please do not delay care.  [facility]  If you or a family member do not have a primary care provider, use the link below to schedule a visit and establish care. When you choose a [facility] primary care physician or advanced practice provider, you gain a long-term partner in health. Find a Primary Care Provider  Learn more about [facility]'s in-office and virtual care options: [facility] - Get Care Now 

## 2021-08-26 NOTE — Patient Instructions (Signed)
  [name], thank you for joining [name], PA-C for today's virtual visit.  While this provider is not your primary care provider (PCP), if your PCP is located in our provider database this encounter information will be shared with them immediately following your visit.  Consent: (Patient) Dawn Horton provided verbal consent for this virtual visit at the beginning of the encounter.  Current Medications:  Current Outpatient Medications:    azelastine (OPTIVAR) 0.05 % ophthalmic solution, Place 1 drop into both eyes 2 (two) times daily., Disp: 6 mL, Rfl: 1   fluticasone (FLONASE) 50 MCG/ACT nasal spray, Place 1 spray into both nostrils daily., Disp: 16 g, Rfl: 2   folic acid (FOLVITE) 1 MG tablet, Take 1 mg by mouth daily., Disp: , Rfl:    levocetirizine (XYZAL) 5 MG tablet, Take 1 tablet (5 mg total) by mouth every evening., Disp: 30 tablet, Rfl: 1   lithium carbonate (ESKALITH) 450 MG CR tablet, Take 450 mg by mouth daily. , Disp: , Rfl:    methotrexate (RHEUMATREX) 2.5 MG tablet, Take by mouth., Disp: , Rfl:    ondansetron (ZOFRAN-ODT) 4 MG disintegrating tablet, Take 1 tablet (4 mg total) by mouth every 8 (eight) hours as needed for nausea or vomiting., Disp: 20 tablet, Rfl: 0   risperiDONE (RISPERDAL) 2 MG tablet, Take 1 tablet (2 mg total) by mouth at bedtime., Disp: 14 tablet, Rfl: 0   Medications ordered in this encounter:  No orders of the defined types were placed in this encounter.    *If you need refills on other medications prior to your next appointment, please contact your pharmacy*  Follow-Up: Call back or seek an in-person evaluation if the symptoms worsen or if the condition fails to improve as anticipated.  Other Instructions [facility] [address] [place], [place] [address] URGENT CARE HOURS Monday - Friday: 8:00am to 8:00pm Saturday: 10:00am to 3:00pm [phone]    If you have been instructed to have an in-person evaluation  today at a local Urgent Care facility, please use the link below. It will take you to a list of all of our available [facility], including address, phone number and hours of operation. Please do not delay care.  [facility]  If you or a family member do not have a primary care provider, use the link below to schedule a visit and establish care. When you choose a [facility] primary care physician or advanced practice provider, you gain a long-term partner in health. Find a Primary Care Provider  Learn more about [facility]'s in-office and virtual care options: [facility] - Get Care Now

## 2021-08-26 NOTE — Progress Notes (Signed)
Virtual Visit Consent   Dawn Horton, you are scheduled for a virtual visit with a Dawn Horton provider today. Just as with appointments in the office, your consent must be obtained to participate. Your consent will be active for this visit and any virtual visit you may have with one of our providers in the next 365 days. If you have a MyChart account, a copy of this consent can be sent to you electronically.  As this is a virtual visit, video technology does not allow for your provider to perform a traditional examination. This may limit your provider's ability to fully assess your condition. If your provider identifies any concerns that need to be evaluated in person or the need to arrange testing (such as labs, EKG, etc.), we will make arrangements to do so. Although advances in technology are sophisticated, we cannot ensure that it will always work on either your end or our end. If the connection with a video visit is poor, the visit may have to be switched to a telephone visit. With either a video or telephone visit, we are not always able to ensure that we have a secure connection.  By engaging in this virtual visit, you consent to the provision of healthcare and authorize for your insurance to be billed (if applicable) for the services provided during this visit. Depending on your insurance coverage, you may receive a charge related to this service.  I need to obtain your verbal consent now. Are you willing to proceed with your visit today? [name] has provided verbal consent on 08/26/2021 for a virtual visit (video or telephone). [name], PA-C  Date: 08/26/2021 9:10 AM  Virtual Visit via Video Note   I, [name], connected with  Dawn Horton  ([id number], 06-Jun-1985) on 08/26/21 at  9:00 AM EDT by a video-enabled telemedicine application and verified that I am speaking with the correct person using two identifiers.  Location: Patient: Virtual Visit Location Patient:  Home Provider: Virtual Visit Location Provider: Home Office   I discussed the limitations of evaluation and management by telemedicine and the availability of in person appointments. The patient expressed understanding and agreed to proceed.    History of Present Illness: Dawn Horton is a 36 y.o. who identifies as a female who was assigned female at birth, and is being seen today for continued right hand pain. Injured at work. Seen at [facility] on 08/22/21. Initial xr reveals soft tissue swelling/contusion.    Problems:  Patient Active Problem List   Diagnosis Date Noted   Bipolar affective disorder, current episode manic with psychotic symptoms (HCC) 05/21/2016    Allergies:  Allergies  Allergen Reactions   Abilify [Aripiprazole] Other (See Comments)    Makes pt delirious and forget things.    Ibuprofen Nausea Only   Penicillins Hives    amoxicillin   Medications:  Current Outpatient Medications:    azelastine (OPTIVAR) 0.05 % ophthalmic solution, Place 1 drop into both eyes 2 (two) times daily., Disp: 6 mL, Rfl: 1   fluticasone (FLONASE) 50 MCG/ACT nasal spray, Place 1 spray into both nostrils daily., Disp: 16 g, Rfl: 2   folic acid (FOLVITE) 1 MG tablet, Take 1 mg by mouth daily., Disp: , Rfl:    levocetirizine (XYZAL) 5 MG tablet, Take 1 tablet (5 mg total) by mouth every evening., Disp: 30 tablet, Rfl: 1   lithium carbonate (ESKALITH) 450 MG CR tablet, Take 450 mg by mouth daily. , Disp: , Rfl:  methotrexate (RHEUMATREX) 2.5 MG tablet, Take by mouth., Disp: , Rfl:    ondansetron (ZOFRAN-ODT) 4 MG disintegrating tablet, Take 1 tablet (4 mg total) by mouth every 8 (eight) hours as needed for nausea or vomiting., Disp: 20 tablet, Rfl: 0   risperiDONE (RISPERDAL) 2 MG tablet, Take 1 tablet (2 mg total) by mouth at bedtime., Disp: 14 tablet, Rfl: 0  Observations/Objective: Patient is well-developed, well-nourished in no acute distress.  Resting comfortably at home.  Head is  normocephalic, atraumatic.  No labored breathing.  Speech is clear and coherent with logical content.  Patient is alert and oriented at baseline.    Assessment and Plan: 1. Crushing injury of right hand, subsequent encounter  - Advised patient since this is a work injury she will require in person evaluation - She will proceed to urgent care for repeat evaluation  Follow Up Instructions: I discussed the assessment and treatment plan with the patient. The patient was provided an opportunity to ask questions and all were answered. The patient agreed with the plan and demonstrated an understanding of the instructions.  A copy of instructions were sent to the patient via MyChart unless otherwise noted below.    The patient was advised to call back or seek an in-person evaluation if the symptoms worsen or if the condition fails to improve as anticipated.  Time:  I spent 12 minutes with the patient via telehealth technology discussing the above problems/concerns.    [name], PA-C

## 2021-08-27 DIAGNOSIS — M79641 Pain in right hand: Secondary | ICD-10-CM | POA: Insufficient documentation

## 2021-08-29 ENCOUNTER — Telehealth: Payer: Medicaid Other | Admitting: Physician Assistant

## 2021-08-29 DIAGNOSIS — M79602 Pain in left arm: Secondary | ICD-10-CM

## 2021-08-29 MED ORDER — METHYLPREDNISOLONE 4 MG PO TBPK: ORAL_TABLET | ORAL | 0 refills | Status: DC

## 2021-08-29 NOTE — Patient Instructions (Signed)
[name], thank you for joining [name], PA-C for today's virtual visit.  While this provider is not your primary care provider (PCP), if your PCP is located in our provider database this encounter information will be shared with them immediately following your visit.  Consent: (Patient) Dawn Horton provided verbal consent for this virtual visit at the beginning of the encounter.  Current Medications:  Current Outpatient Medications:    methylPREDNISolone (MEDROL DOSEPAK) 4 MG TBPK tablet, 6 day taper; take as directed on package instructions, Disp: 21 tablet, Rfl: 0   azelastine (OPTIVAR) 0.05 % ophthalmic solution, Place 1 drop into both eyes 2 (two) times daily., Disp: 6 mL, Rfl: 1   fluticasone (FLONASE) 50 MCG/ACT nasal spray, Place 1 spray into both nostrils daily., Disp: 16 g, Rfl: 2   folic acid (FOLVITE) 1 MG tablet, Take 1 mg by mouth daily., Disp: , Rfl:    levocetirizine (XYZAL) 5 MG tablet, Take 1 tablet (5 mg total) by mouth every evening., Disp: 30 tablet, Rfl: 1   lithium carbonate (ESKALITH) 450 MG CR tablet, Take 450 mg by mouth daily. , Disp: , Rfl:    methotrexate (RHEUMATREX) 2.5 MG tablet, Take by mouth., Disp: , Rfl:    ondansetron (ZOFRAN-ODT) 4 MG disintegrating tablet, Take 1 tablet (4 mg total) by mouth every 8 (eight) hours as needed for nausea or vomiting., Disp: 20 tablet, Rfl: 0   risperiDONE (RISPERDAL) 2 MG tablet, Take 1 tablet (2 mg total) by mouth at bedtime., Disp: 14 tablet, Rfl: 0   Medications ordered in this encounter:  Meds ordered this encounter  Medications   methylPREDNISolone (MEDROL DOSEPAK) 4 MG TBPK tablet    Sig: 6 day taper; take as directed on package instructions    Dispense:  21 tablet    Refill:  0    Order Specific Question:   Supervising Provider    Answer:   Eber Hong [[id number]]     *If you need refills on other medications prior to your next appointment, please contact your pharmacy*  Follow-Up: Call back or  seek an in-person evaluation if the symptoms worsen or if the condition fails to improve as anticipated.  Other Instructions Tendinitis  Tendinitis is inflammation of a tendon. A tendon is a strong cord of tissue that connects muscle to bone. Tendinitis can affect any tendon, but it most commonly affects the: Shoulder tendon (biceps tendon or rotator cuff). Ankle tendon (Achilles tendon). Elbow tendons. Tendons in the wrist. What are the causes? This condition may be caused by: Overusing a tendon or muscle. This is the most common cause. Age-related wear and tear. Injury. Inflammatory conditions, such as arthritis. Certain medicines. What increases the risk? You are more likely to develop this condition if you do activities that involve the same movements over and over again (repetitive motions). What are the signs or symptoms? Symptoms of this condition may include: Pain. Tenderness. Mild swelling. Decreased range of motion. How is this diagnosed? This condition is diagnosed with a physical exam. You may also have tests, such as: Ultrasound. This uses sound waves to make an image of the inside of your body in the affected area. MRI. This uses magnetic fields and radio waves to make an image of the inside of your body in the affected area. How is this treated? This condition may be treated by resting, icing, applying pressure (compression), and raising (elevating) the affected area above the level of your heart. This is known as RICE  therapy. Treatment may also include: Medicines to help reduce inflammation or to help reduce pain. Exercises or physical therapy to improve movement and strength of the tendon. A brace or splint. Injection of corticosteroid medicine. This may be done in some cases. Surgery. This is rarely needed and only used if all other treatment has failed. Follow these instructions at home: If you have a removable splint or brace: Wear the splint or brace as  told by your health care provider. Remove it only as told by your health care provider. Check the skin around the splint or brace every day. Tell your health care provider about any concerns. Loosen the splint or brace if your fingers or toes tingle, become numb, or turn cold and blue. Keep the splint or brace clean. If the splint or brace is not waterproof: Do not let it get wet. Cover it with a watertight covering when you take a bath or shower, or remove it as told by your health care provider. Managing pain, stiffness, and swelling     If directed, put ice on the affected area. To do this: If you have a removable splint or brace, remove it as told by your health care provider. Put ice in a plastic bag. Place a towel between your skin and the bag. Leave the ice on for 20 minutes, 2-3 times a day. Remove the ice if your skin turns bright red. This is very important. If you cannot feel pain, heat, or cold, you have a greater risk of damage to the area. Move the fingers or toes of the affected limb often, if this applies. This can help to reduce stiffness and swelling. If directed, elevate the affected area above the level of your heart while you are sitting or lying down. If directed, apply heat to the affected area before you exercise. Use the heat source that your health care provider recommends, such as a moist heat pack or a heating pad. Place a towel between your skin and the heat source. Leave the heat on for 20-30 minutes. Remove the heat if your skin turns bright red. This is especially important if you are unable to feel pain, heat, or cold. You have a greater risk of getting burned. Activity Rest the affected area as told by your health care provider. Ask your health care provider when it is safe to drive if you have a splint or brace on any part of your arm or leg. Return to your normal activities as told by your health care provider. Ask your health care provider what  activities are safe for you. Avoid using the affected area while you are experiencing symptoms of tendinitis. Do exercises as told by your health care provider. General instructions Wear an elastic bandage or compression wrap only as told by your health care provider. Take over-the-counter and prescription medicines only as told by your health care provider. Keep all follow-up visits. This is important. Contact a health care provider if: Your symptoms do not improve. You develop new, unexplained problems, such as numbness in your hands or feet. Summary Tendinitis is inflammation of a tendon. You are more likely to develop this condition if you do activities that involve the same movements over and over again. This condition may be treated by resting, icing, applying pressure (compression), and elevating the area above the level of your heart. This is known as RICE therapy. Avoid using the affected area while you are experiencing symptoms of tendinitis. This information is not intended  to replace advice given to you by your health care provider. Make sure you discuss any questions you have with your health care provider. Document Revised: 10/11/2020 Document Reviewed: 10/11/2020 Elsevier Patient Education  2023 Elsevier Inc.    If you have been instructed to have an in-person evaluation today at a local Urgent Care facility, please use the link below. It will take you to a list of all of our available [facility] Urgent [facility], including address, phone number and hours of operation. Please do not delay care.  [facility]  If you or a family member do not have a primary care provider, use the link below to schedule a visit and establish care. When you choose a [facility] primary care physician or advanced practice provider, you gain a long-term partner in health. Find a Primary Care Provider  Learn more about [facility]'s in-office and virtual care options: [facility] - Get  Care Now

## 2021-08-29 NOTE — Progress Notes (Signed)
Virtual Visit Consent   Dawn Horton, you are scheduled for a virtual visit with a [facility] provider today. Just as with appointments in the office, your consent must be obtained to participate. Your consent will be active for this visit and any virtual visit you may have with one of our providers in the next 365 days. If you have a MyChart account, a copy of this consent can be sent to you electronically.  As this is a virtual visit, video technology does not allow for your provider to perform a traditional examination. This may limit your provider's ability to fully assess your condition. If your provider identifies any concerns that need to be evaluated in person or the need to arrange testing (such as labs, EKG, etc.), we will make arrangements to do so. Although advances in technology are sophisticated, we cannot ensure that it will always work on either your end or our end. If the connection with a video visit is poor, the visit may have to be switched to a telephone visit. With either a video or telephone visit, we are not always able to ensure that we have a secure connection.  By engaging in this virtual visit, you consent to the provision of healthcare and authorize for your insurance to be billed (if applicable) for the services provided during this visit. Depending on your insurance coverage, you may receive a charge related to this service.  I need to obtain your verbal consent now. Are you willing to proceed with your visit today? Kendel Pesnell has provided verbal consent on 08/29/2021 for a virtual visit (video or telephone). [name], PA-C  Date: 08/29/2021 5:31 PM  Virtual Visit via Video Note   IMargaretann Loveless, connected with  Grizel Vesely  ([id number], Jun 13, 1985) on 08/29/21 at  4:00 PM EDT by a video-enabled telemedicine application and verified that I am speaking with the correct person using two identifiers.  Location: Patient: Virtual Visit Location Patient:  Home Provider: Virtual Visit Location Provider: Home Office   I discussed the limitations of evaluation and management by telemedicine and the availability of in person appointments. The patient expressed understanding and agreed to proceed.    History of Present Illness: Dawn Horton is a 36 y.o. who identifies as a female who was assigned female at birth, and is being seen today for left shoulder, wrist and hand pain. Had a crush injury to the right hand and has been using her left arm/hand more. Does have a history of tendinitis and feels this has been flared with using more. She did have to leave work today early due to pain. She has been taking tylenol with no relief. Does have a muscle relaxer but she has not tried yet due to worried about side effects.   Problems:  Patient Active Problem List   Diagnosis Date Noted   Bipolar affective disorder, current episode manic with psychotic symptoms (HCC) 05/21/2016    Allergies:  Allergies  Allergen Reactions   Abilify [Aripiprazole] Other (See Comments)    Makes pt delirious and forget things.    Ibuprofen Nausea Only   Penicillins Hives    amoxicillin   Medications:  Current Outpatient Medications:    methylPREDNISolone (MEDROL DOSEPAK) 4 MG TBPK tablet, 6 day taper; take as directed on package instructions, Disp: 21 tablet, Rfl: 0   azelastine (OPTIVAR) 0.05 % ophthalmic solution, Place 1 drop into both eyes 2 (two) times daily., Disp: 6 mL, Rfl: 1   fluticasone (FLONASE)  50 MCG/ACT nasal spray, Place 1 spray into both nostrils daily., Disp: 16 g, Rfl: 2   folic acid (FOLVITE) 1 MG tablet, Take 1 mg by mouth daily., Disp: , Rfl:    levocetirizine (XYZAL) 5 MG tablet, Take 1 tablet (5 mg total) by mouth every evening., Disp: 30 tablet, Rfl: 1   lithium carbonate (ESKALITH) 450 MG CR tablet, Take 450 mg by mouth daily. , Disp: , Rfl:    methotrexate (RHEUMATREX) 2.5 MG tablet, Take by mouth., Disp: , Rfl:    ondansetron (ZOFRAN-ODT) 4  MG disintegrating tablet, Take 1 tablet (4 mg total) by mouth every 8 (eight) hours as needed for nausea or vomiting., Disp: 20 tablet, Rfl: 0   risperiDONE (RISPERDAL) 2 MG tablet, Take 1 tablet (2 mg total) by mouth at bedtime., Disp: 14 tablet, Rfl: 0  Observations/Objective: Patient is well-developed, well-nourished in no acute distress.  Resting comfortably at home.  Head is normocephalic, atraumatic.  No labored breathing.  Speech is clear and coherent with logical content.  Patient is alert and oriented at baseline.    Assessment and Plan: 1. Left arm pain - methylPREDNISolone (MEDROL DOSEPAK) 4 MG TBPK tablet; 6 day taper; take as directed on package instructions  Dispense: 21 tablet; Refill: 0  - Will add medrol for inflammation - Advised since she is home and can rest, she is ok to use the muscle relaxer so she can sleep as needed - Ice painful areas - Work note provided for leaving early; she asked for a work note for Sunday as well, but was advised we could not provide that this early. If she feels it is necessary she can reach back out Saturday night or Sunday for this and to be re-evaluated - Continue follow up with orthopedics as previously advised  Follow Up Instructions: I discussed the assessment and treatment plan with the patient. The patient was provided an opportunity to ask questions and all were answered. The patient agreed with the plan and demonstrated an understanding of the instructions.  A copy of instructions were sent to the patient via MyChart unless otherwise noted below.    The patient was advised to call back or seek an in-person evaluation if the symptoms worsen or if the condition fails to improve as anticipated.  Time:  I spent 13 minutes with the patient via telehealth technology discussing the above problems/concerns.    [name], PA-C

## 2021-08-31 ENCOUNTER — Telehealth: Payer: Medicaid Other | Admitting: Family Medicine

## 2021-08-31 DIAGNOSIS — S46912A Strain of unspecified muscle, fascia and tendon at shoulder and upper arm level, left arm, initial encounter: Secondary | ICD-10-CM | POA: Diagnosis not present

## 2021-08-31 MED ORDER — PREDNISONE 10 MG PO TABS: 10.0000 mg | ORAL_TABLET | Freq: Every day | ORAL | 0 refills | Status: AC

## 2021-08-31 NOTE — Progress Notes (Signed)
Virtual Visit Consent   Dawn Horton, you are scheduled for a virtual visit with a [facility] provider today. Just as with appointments in the office, your consent must be obtained to participate. Your consent will be active for this visit and any virtual visit you may have with one of our providers in the next 365 days. If you have a MyChart account, a copy of this consent can be sent to you electronically.  As this is a virtual visit, video technology does not allow for your provider to perform a traditional examination. This may limit your provider's ability to fully assess your condition. If your provider identifies any concerns that need to be evaluated in person or the need to arrange testing (such as labs, EKG, etc.), we will make arrangements to do so. Although advances in technology are sophisticated, we cannot ensure that it will always work on either your end or our end. If the connection with a video visit is poor, the visit may have to be switched to a telephone visit. With either a video or telephone visit, we are not always able to ensure that we have a secure connection.  By engaging in this virtual visit, you consent to the provision of healthcare and authorize for your insurance to be billed (if applicable) for the services provided during this visit. Depending on your insurance coverage, you may receive a charge related to this service.  I need to obtain your verbal consent now. Are you willing to proceed with your visit today? [name] has provided verbal consent on 08/31/2021 for a virtual visit (video or telephone). Georgana Curio, FNP  Date: 08/31/2021 5:23 PM  Virtual Visit via Video Note   I, Georgana Curio, connected with  Dawn Horton  ([id number], December 12, 1985) on 08/31/21 at  5:15 PM EDT by a video-enabled telemedicine application and verified that I am speaking with the correct person using two identifiers.  Location: Patient: Virtual Visit Location Patient: Home Provider:  Virtual Visit Location Provider: Home Office   I discussed the limitations of evaluation and management by telemedicine and the availability of in person appointments. The patient expressed understanding and agreed to proceed.    History of Present Illness: Dawn Horton is a 37 y.o. who identifies as a female who was assigned female at birth, and is being seen today for left shoulder strain. She says she has had trouble with her hand and tendonitis. She is on methotrexate. She says she can take prednisone. Also requests a note for work tomorrow. Marland Kitchen  HPI: HPI  Problems:  Patient Active Problem List   Diagnosis Date Noted   Bipolar affective disorder, current episode manic with psychotic symptoms (HCC) 05/21/2016    Allergies:  Allergies  Allergen Reactions   Abilify [Aripiprazole] Other (See Comments)    Makes pt delirious and forget things.    Ibuprofen Nausea Only   Penicillins Hives    amoxicillin   Medications:  Current Outpatient Medications:    azelastine (OPTIVAR) 0.05 % ophthalmic solution, Place 1 drop into both eyes 2 (two) times daily., Disp: 6 mL, Rfl: 1   fluticasone (FLONASE) 50 MCG/ACT nasal spray, Place 1 spray into both nostrils daily., Disp: 16 g, Rfl: 2   folic acid (FOLVITE) 1 MG tablet, Take 1 mg by mouth daily., Disp: , Rfl:    levocetirizine (XYZAL) 5 MG tablet, Take 1 tablet (5 mg total) by mouth every evening., Disp: 30 tablet, Rfl: 1   lithium carbonate (ESKALITH) 450 MG CR  tablet, Take 450 mg by mouth daily. , Disp: , Rfl:    methotrexate (RHEUMATREX) 2.5 MG tablet, Take by mouth., Disp: , Rfl:    methylPREDNISolone (MEDROL DOSEPAK) 4 MG TBPK tablet, 6 day taper; take as directed on package instructions, Disp: 21 tablet, Rfl: 0   ondansetron (ZOFRAN-ODT) 4 MG disintegrating tablet, Take 1 tablet (4 mg total) by mouth every 8 (eight) hours as needed for nausea or vomiting., Disp: 20 tablet, Rfl: 0   risperiDONE (RISPERDAL) 2 MG tablet, Take 1 tablet (2 mg total)  by mouth at bedtime., Disp: 14 tablet, Rfl: 0  Observations/Objective: Patient is well-developed, well-nourished in no acute distress.  Resting comfortably  at home.  Head is normocephalic, atraumatic.  No labored breathing.  Speech is clear and coherent with logical content.  Patient is alert and oriented at baseline.    Assessment and Plan: 1. Strain of left shoulder, initial encounter  Heat, rest, med use and side effects discussed, ortho if sx worsen.   Follow Up Instructions: I discussed the assessment and treatment plan with the patient. The patient was provided an opportunity to ask questions and all were answered. The patient agreed with the plan and demonstrated an understanding of the instructions.  A copy of instructions were sent to the patient via MyChart unless otherwise noted below.     The patient was advised to call back or seek an in-person evaluation if the symptoms worsen or if the condition fails to improve as anticipated.  Time:  I spent 8 minutes with the patient via telehealth technology discussing the above problems/concerns.    Georgana Curio, FNP

## 2021-08-31 NOTE — Patient Instructions (Signed)
Shoulder Sprain ? ?A shoulder sprain is a partial or complete tear in one of the tough, fiber-like tissues (ligaments) in the shoulder. The ligaments in the shoulder help to hold the shoulder in place. ?What are the causes? ?This condition may be caused by: ?A fall. ?A hit to the shoulder. ?A twist of the arm. ?What increases the risk? ?You are more likely to develop this condition if you: ?Play sports. ?Have problems with balance or coordination. ?What are the signs or symptoms? ?Symptoms of this condition include: ?Pain when moving the shoulder. ?Limited ability to move the shoulder. ?Swelling and tenderness on top of the shoulder. ?Warmth in the shoulder. ?A change in the shape of the shoulder. ?Redness or bruising on the shoulder. ?How is this diagnosed? ?This condition is diagnosed with: ?A physical exam. During the exam, you may be asked to do simple exercises with your shoulder. ?Imaging tests such as X-rays, MRI, or a CT scan. These tests can show how severe the sprain is. ?How is this treated? ?This condition may be treated with: ?Rest. ?Pain medicine. ?Ice. ?A sling or brace. This is used to keep the arm still while the shoulder is healing. ?Physical therapy or rehabilitation exercises. These help to improve the range of motion and strength of the shoulder. ?Surgery (rare). Surgery may be needed if the sprain caused a joint to become unstable. Surgery may also be needed to reduce pain. ?Some people may develop ongoing shoulder pain or lose some range of motion in the shoulder. However, most people do not develop long-term problems. ?Follow these instructions at home: ? ?If you have a sling or brace: ?Wear the sling or brace as told by your health care provider. Remove it only as told by your health care provider. ?Loosen the sling or brace if your fingers tingle, become numb, or turn cold and blue. ?Keep the sling or brace clean. ?If the sling or brace is not waterproof: ?Do not let it get wet. ?Cover it  with a watertight covering when you take a bath or shower. ?Activity ?Rest your shoulder. ?Move your arm only as much as told by your health care provider, but move your hand and fingers often to prevent stiffness and swelling. ?Return to your normal activities as told by your health care provider. Ask your health care provider what activities are safe for you. ?Ask your health care provider when it is safe for you to drive if you have a sling or brace on your shoulder. ?If you were shown how to do any exercises, do them as told by your health care provider. ?General instructions ?If directed, put ice on the affected area. ?Put ice in a plastic bag. ?Place a towel between your skin and the bag. ?Leave the ice on for 20 minutes, 2-3 times a day. ?Take over-the-counter and prescription medicines only as told by your health care provider. ?Do not use any products that contain nicotine or tobacco, such as cigarettes, e-cigarettes, and chewing tobacco. These can delay healing. If you need help quitting, ask your health care provider. ?Keep all follow-up visits as told by your health care provider. This is important. ?Contact a health care provider if: ?Your pain gets worse. ?Your pain is not relieved with medicines. ?You have increased redness or swelling. ?Get help right away if: ?You have a fever. ?You cannot move your arm or shoulder. ?You develop severe numbness or tingling in your arm, hand, or fingers. ?Your arm, hand, or fingers feel cold and turn   blue, white, or gray. ?Summary ?A shoulder sprain is a partial or complete tear in one of the tough, fiber-like tissues (ligaments) in the shoulder. ?This condition may be caused by a fall, a hit to the shoulder, or a twist of the arm. ?Treatment usually includes rest, ice, and pain medicine as needed. ?If you have a sling or brace, wear it as told by your health care provider. Remove it only as told by your health care provider. ?This information is not intended to  replace advice given to you by your health care provider. Make sure you discuss any questions you have with your health care provider. ?Document Revised: 10/24/2020 Document Reviewed: 10/24/2020 ?Elsevier Patient Education ? 2023 [employer] ? ?

## 2021-09-22 ENCOUNTER — Ambulatory Visit
Admission: RE | Admit: 2021-09-22 | Discharge: 2021-09-22 | Disposition: A | Discharge status: Home / Self Care | Payer: Medicaid Other | Source: Ambulatory Visit | Attending: Physician Assistant | Admitting: Physician Assistant

## 2021-09-22 VITALS — BP 115/79 | HR 66 | Temp 98.1°F | Resp 18

## 2021-09-22 DIAGNOSIS — M25511 Pain in right shoulder: Secondary | ICD-10-CM | POA: Diagnosis not present

## 2021-09-22 DIAGNOSIS — M79642 Pain in left hand: Secondary | ICD-10-CM

## 2021-09-22 DIAGNOSIS — M25512 Pain in left shoulder: Secondary | ICD-10-CM | POA: Diagnosis not present

## 2021-09-22 MED ORDER — PREDNISONE 20 MG PO TABS: 40.0000 mg | ORAL_TABLET | Freq: Every day | ORAL | 0 refills | Status: AC

## 2021-09-22 NOTE — ED Triage Notes (Signed)
Pt presents with chronic shoulder & wrist pain from tendonitis.

## 2021-09-22 NOTE — ED Provider Notes (Signed)
[facility]    CSN: [id number] Arrival date & time: 09/22/21  1236      History   Chief Complaint Chief Complaint  Patient presents with   Shoulder Pain    Was injured at work picking up a bundle can't use right hand have tendinitis and my left shoulder and left hand hurt and are swollen as a result - Entered by patient    HPI Dawn Horton is a 36 y.o. female.   Patient here today for evaluation of bilateral shoulder pain and left hand pain that of started after an accident at work recently.  She states that at work she had a 50 pound bundle fall onto her right wrist and at that time she had to use both arms to pick up the bundle and remove it.  She states that she has been seen by Ortho, and has an appointment in 3 days with same.  She denies any numbness.  She does have some swelling to her left hand and is wearing a brace to her right wrist. She was prescribed hydrocodone which was helping with pain but she no longer has any medication.   The history is provided by the patient.  Shoulder Pain Associated symptoms: no fever     Past Medical History:  Diagnosis Date   Anemia    Bipolar 1 disorder (HCC)    Depression    GERD (gastroesophageal reflux disease)    Tendinitis     Patient Active Problem List   Diagnosis Date Noted   Bipolar affective disorder, current episode manic with psychotic symptoms (HCC) 05/21/2016    Past Surgical History:  Procedure Laterality Date   CESAREAN SECTION      OB History     Gravida  3   Para  2   Term  2   Preterm  0   AB  1   Living  2      SAB  1   IAB  0   Ectopic  0   Multiple  0   Live Births  2            Home Medications    Prior to Admission medications   Medication Sig Start Date End Date Taking? Authorizing Provider  predniSONE (DELTASONE) 20 MG tablet Take 2 tablets (40 mg total) by mouth daily with breakfast for 5 days. 09/22/21 09/27/21 Yes Tomi Bamberger, PA-C  azelastine  (OPTIVAR) 0.05 % ophthalmic solution Place 1 drop into both eyes 2 (two) times daily. 05/20/21   Waldon Merl, PA-C  fluticasone (FLONASE) 50 MCG/ACT nasal spray Place 1 spray into both nostrils daily. 04/15/21   Madelyn Brunner, DO  folic acid (FOLVITE) 1 MG tablet Take 1 mg by mouth daily. 09/12/20   [provider]  levocetirizine (XYZAL) 5 MG tablet Take 1 tablet (5 mg total) by mouth every evening. 05/20/21   Waldon Merl, PA-C  lithium carbonate (ESKALITH) 450 MG CR tablet Take 450 mg by mouth daily.     [provider]  methotrexate (RHEUMATREX) 2.5 MG tablet Take by mouth. 10/17/20   [provider]  ondansetron (ZOFRAN-ODT) 4 MG disintegrating tablet Take 1 tablet (4 mg total) by mouth every 8 (eight) hours as needed for nausea or vomiting. 07/01/21   Waldon Merl, PA-C  risperiDONE (RISPERDAL) 2 MG tablet Take 1 tablet (2 mg total) by mouth at bedtime. 05/20/16   Roxy Horseman, PA-C  traZODone (DESYREL) 25 mg TABS tablet  Take 50 mg by mouth at bedtime.  11/08/19  [provider]    Family History Family History  Problem Relation Age of Onset   Healthy Mother    Healthy Father     Social History Social History   Tobacco Use   Smoking status: Every Day    Packs/day: 1.00    Years: 15.00    Total pack years: 15.00    Types: Cigarettes   Smokeless tobacco: Never  Substance Use Topics   Alcohol use: Not Currently    Comment: Occ   Drug use: Not Currently    Types: Marijuana    Comment: no longer using     Allergies   Abilify [aripiprazole], Ibuprofen, and Penicillins   Review of Systems Review of Systems  Constitutional:  Negative for chills and fever.  Eyes:  Negative for discharge and redness.  Gastrointestinal:  Negative for nausea and vomiting.  Musculoskeletal:  Positive for arthralgias.  Neurological:  Negative for numbness.     Physical Exam Triage Vital Signs ED Triage Vitals  Enc Vitals Group     BP       Pulse      Resp      Temp      Temp src      SpO2      Weight      Height      Head Circumference      Peak Flow      Pain Score      Pain Loc      Pain Edu?      Excl. in GC?    No data found.  Updated Vital Signs BP 115/79 (BP Location: Left Arm)   Pulse 66   Temp 98.1 F (36.7 C) (Oral)   Resp 18   SpO2 98%    Physical Exam Vitals and nursing note reviewed.  Constitutional:      General: She is not in acute distress.    Appearance: Normal appearance. She is not ill-appearing.  HENT:     Head: Normocephalic and atraumatic.  Eyes:     Conjunctiva/sclera: Conjunctivae normal.  Cardiovascular:     Rate and Rhythm: Normal rate.  Pulmonary:     Effort: Pulmonary effort is normal.  Musculoskeletal:     Comments: Decreased ROM of bilateral shoulders in all directions due to pain, mild diffuse swelling to left dorsal hand without erythema  Skin:    Capillary Refill: Normal cap refill to left finger Neurological:     Mental Status: She is alert.     Comments: Gross sensation intact to left fingers distally  Psychiatric:        Mood and Affect: Mood normal.        Behavior: Behavior normal.        Thought Content: Thought content normal.      UC Treatments / Results  Labs (all labs ordered are listed, but only abnormal results are displayed) Labs Reviewed - No data to display  EKG   Radiology No results found.  Procedures Procedures (including critical care time)  Medications Ordered in UC Medications - No data to display  Initial Impression / Assessment and Plan / UC Course  I have reviewed the triage vital signs and the nursing notes.  Pertinent labs & imaging results that were available during my care of the patient were reviewed by me and considered in my medical decision making (see chart for details).    Prednisone prescribed to hopefully  help with inflammation that I suspect is due to the accident.  She will follow-up with Ortho as previously  scheduled.  Final Clinical Impressions(s) / UC Diagnoses   Final diagnoses:  Left hand pain  Acute pain of both shoulders   Discharge Instructions   None    ED Prescriptions     Medication Sig Dispense Auth. Provider   predniSONE (DELTASONE) 20 MG tablet Take 2 tablets (40 mg total) by mouth daily with breakfast for 5 days. 10 tablet Tomi Bamberger, PA-C      PDMP not reviewed this encounter.   Tomi Bamberger, PA-C 09/22/21 [phone]

## 2021-10-02 ENCOUNTER — Other Ambulatory Visit: Payer: Self-pay | Admitting: Sports Medicine

## 2021-10-22 ENCOUNTER — Telehealth: Payer: Medicaid Other | Admitting: Physician Assistant

## 2021-10-22 DIAGNOSIS — M654 Radial styloid tenosynovitis [de Quervain]: Secondary | ICD-10-CM

## 2021-10-22 MED ORDER — CELECOXIB 100 MG PO CAPS: 100.0000 mg | ORAL_CAPSULE | Freq: Two times a day (BID) | ORAL | 0 refills | Status: DC

## 2021-10-22 NOTE — Progress Notes (Signed)
Virtual Visit Consent   Dawn Horton, you are scheduled for a virtual visit with a [facility] provider today. Just as with appointments in the office, your consent must be obtained to participate. Your consent will be active for this visit and any virtual visit you may have with one of our providers in the next 365 days. If you have a MyChart account, a copy of this consent can be sent to you electronically.  As this is a virtual visit, video technology does not allow for your provider to perform a traditional examination. This may limit your provider's ability to fully assess your condition. If your provider identifies any concerns that need to be evaluated in person or the need to arrange testing (such as labs, EKG, etc.), we will make arrangements to do so. Although advances in technology are sophisticated, we cannot ensure that it will always work on either your end or our end. If the connection with a video visit is poor, the visit may have to be switched to a telephone visit. With either a video or telephone visit, we are not always able to ensure that we have a secure connection.  By engaging in this virtual visit, you consent to the provision of healthcare and authorize for your insurance to be billed (if applicable) for the services provided during this visit. Depending on your insurance coverage, you may receive a charge related to this service.  I need to obtain your verbal consent now. Are you willing to proceed with your visit today? Dawn Horton has provided verbal consent on 10/22/2021 for a virtual visit (video or telephone). Dawn Horton, [place]  Date: 10/22/2021 3:29 PM  Virtual Visit via Video Note   I, Dawn Horton, connected with  Dawn Horton  ([id number], November 13, 1985) on 10/22/21 at  3:00 PM EDT by a video-enabled telemedicine application and verified that I am speaking with the correct person using two identifiers.  Location: Patient: Virtual Visit Location Patient:  Home Provider: Virtual Visit Location Provider: Home Office   I discussed the limitations of evaluation and management by telemedicine and the availability of in person appointments. The patient expressed understanding and agreed to proceed.    History of Present Illness: Dawn Horton is a 36 y.o. who identifies as a female who was assigned female at birth, and is being seen today for swelling and tenderness of her L thumb on waking this morning. Notes more swelling and pain laterally. Denies burising or redness of skin. Notes she slept on her hand last night and thinks this is the cause of symptoms. Is able to move her thumb but with some pain due to swelling. Denies numbness, tingling.    HPI: HPI  Problems:  Patient Active Problem List   Diagnosis Date Noted   Bipolar affective disorder, current episode manic with psychotic symptoms (HCC) 05/21/2016    Allergies:  Allergies  Allergen Reactions   Abilify [Aripiprazole] Other (See Comments)    Makes pt delirious and forget things.    Ibuprofen Nausea Only   Penicillins Hives    amoxicillin   Medications:  Current Outpatient Medications:    celecoxib (CELEBREX) 100 MG capsule, Take 1 capsule (100 mg total) by mouth 2 (two) times daily., Disp: 30 capsule, Rfl: 0   azelastine (OPTIVAR) 0.05 % ophthalmic solution, Place 1 drop into both eyes 2 (two) times daily., Disp: 6 mL, Rfl: 1   fluticasone (FLONASE) 50 MCG/ACT nasal spray, SPRAY 1 SPRAY INTO BOTH NOSTRILS DAILY., Disp: 16  mL, Rfl: 2   folic acid (FOLVITE) 1 MG tablet, Take 1 mg by mouth daily., Disp: , Rfl:    levocetirizine (XYZAL) 5 MG tablet, Take 1 tablet (5 mg total) by mouth every evening., Disp: 30 tablet, Rfl: 1   lithium carbonate (ESKALITH) 450 MG CR tablet, Take 450 mg by mouth daily. , Disp: , Rfl:    methotrexate (RHEUMATREX) 2.5 MG tablet, Take by mouth., Disp: , Rfl:    ondansetron (ZOFRAN-ODT) 4 MG disintegrating tablet, Take 1 tablet (4 mg total) by mouth every 8  (eight) hours as needed for nausea or vomiting., Disp: 20 tablet, Rfl: 0   risperiDONE (RISPERDAL) 2 MG tablet, Take 1 tablet (2 mg total) by mouth at bedtime., Disp: 14 tablet, Rfl: 0  Observations/Objective: Patient is well-developed, well-nourished in no acute distress.  Resting comfortably at home.  Head is normocephalic, atraumatic.  No labored breathing. Speech is clear and coherent with logical content.  Patient is alert and oriented at baseline.  Moderate swelling of thumb noted, more significant proximally and laterally. No lesion of skin noted. Normal range of motion including opposition of thumb. + Finkelstein test.   Assessment and Plan: 1. Tenosynovitis, de Quervain - celecoxib (CELEBREX) 100 MG capsule; Take 1 capsule (100 mg total) by mouth 2 (two) times daily.  Dispense: 30 capsule; Refill: 0  Recommend thumb spica splint. Ice packs as discussed. Start trial of Celebrex. If making her nauseas as some NSAIDs due, she is to switch to OTC tylenol. Strict in-person evaluation precautions reviewed.   Follow Up Instructions: I discussed the assessment and treatment plan with the patient. The patient was provided an opportunity to ask questions and all were answered. The patient agreed with the plan and demonstrated an understanding of the instructions.  A copy of instructions were sent to the patient via MyChart unless otherwise noted below.   The patient was advised to call back or seek an in-person evaluation if the symptoms worsen or if the condition fails to improve as anticipated.  Time:  I spent 10 minutes with the patient via telehealth technology discussing the above problems/concerns.    Dawn Climes, PA-C

## 2021-10-22 NOTE — Patient Instructions (Signed)
  [name], thank you for joining [name], PA-C for today's virtual visit.  While this provider is not your primary care provider (PCP), if your PCP is located in our provider database this encounter information will be shared with them immediately following your visit.  Consent: (Patient) Dawn Horton provided verbal consent for this virtual visit at the beginning of the encounter.  Current Medications:  Current Outpatient Medications:    azelastine (OPTIVAR) 0.05 % ophthalmic solution, Place 1 drop into both eyes 2 (two) times daily., Disp: 6 mL, Rfl: 1   fluticasone (FLONASE) 50 MCG/ACT nasal spray, SPRAY 1 SPRAY INTO BOTH NOSTRILS DAILY., Disp: 16 mL, Rfl: 2   folic acid (FOLVITE) 1 MG tablet, Take 1 mg by mouth daily., Disp: , Rfl:    levocetirizine (XYZAL) 5 MG tablet, Take 1 tablet (5 mg total) by mouth every evening., Disp: 30 tablet, Rfl: 1   lithium carbonate (ESKALITH) 450 MG CR tablet, Take 450 mg by mouth daily. , Disp: , Rfl:    methotrexate (RHEUMATREX) 2.5 MG tablet, Take by mouth., Disp: , Rfl:    ondansetron (ZOFRAN-ODT) 4 MG disintegrating tablet, Take 1 tablet (4 mg total) by mouth every 8 (eight) hours as needed for nausea or vomiting., Disp: 20 tablet, Rfl: 0   risperiDONE (RISPERDAL) 2 MG tablet, Take 1 tablet (2 mg total) by mouth at bedtime., Disp: 14 tablet, Rfl: 0   Medications ordered in this encounter:  No orders of the defined types were placed in this encounter.    *If you need refills on other medications prior to your next appointment, please contact your pharmacy*  Follow-Up: Call back or seek an in-person evaluation if the symptoms worsen or if the condition fails to improve as anticipated.  Other Instructions Apply ice packs to thumb and hand as discussed. Get a thumb spica splint at the pharmacy to wear as directed. USe the celebrex as directed. If not resolving or any new/worsening symptoms, please seek an in person evaluation  ASAP.   If you have been instructed to have an in-person evaluation today at a local Urgent Care facility, please use the link below. It will take you to a list of all of our available [facility], including address, phone number and hours of operation. Please do not delay care.  [facility]  If you or a family member do not have a primary care provider, use the link below to schedule a visit and establish care. When you choose a [facility] primary care physician or advanced practice provider, you gain a long-term partner in health. Find a Primary Care Provider  Learn more about [facility]'s in-office and virtual care options: [facility] - Get Care Now Apply ic

## 2021-10-23 ENCOUNTER — Encounter: Payer: Self-pay | Admitting: Physician Assistant

## 2021-10-31 ENCOUNTER — Telehealth: Payer: Medicaid Other | Admitting: Physician Assistant

## 2021-10-31 DIAGNOSIS — T148XXA Other injury of unspecified body region, initial encounter: Secondary | ICD-10-CM | POA: Diagnosis not present

## 2021-10-31 MED ORDER — CYCLOBENZAPRINE HCL 5 MG PO TABS: 5.0000 mg | ORAL_TABLET | Freq: Three times a day (TID) | ORAL | 0 refills | Status: DC | PRN

## 2021-10-31 NOTE — Progress Notes (Signed)
Virtual Visit Consent   Pamala Hayman, you are scheduled for a virtual visit with a [facility] provider today. Just as with appointments in the office, your consent must be obtained to participate. Your consent will be active for this visit and any virtual visit you may have with one of our providers in the next 365 days. If you have a MyChart account, a copy of this consent can be sent to you electronically.  As this is a virtual visit, video technology does not allow for your provider to perform a traditional examination. This may limit your provider's ability to fully assess your condition. If your provider identifies any concerns that need to be evaluated in person or the need to arrange testing (such as labs, EKG, etc.), we will make arrangements to do so. Although advances in technology are sophisticated, we cannot ensure that it will always work on either your end or our end. If the connection with a video visit is poor, the visit may have to be switched to a telephone visit. With either a video or telephone visit, we are not always able to ensure that we have a secure connection.  By engaging in this virtual visit, you consent to the provision of healthcare and authorize for your insurance to be billed (if applicable) for the services provided during this visit. Depending on your insurance coverage, you may receive a charge related to this service.  I need to obtain your verbal consent now. Are you willing to proceed with your visit today? Dawn Horton has provided verbal consent on 10/31/2021 for a virtual visit (video or telephone). [name], [place]  Date: 10/31/2021 6:24 PM  Virtual Visit via Video Note   I, [name], connected with  Dawn Horton  ([id number], 1985/04/11) on 10/31/21 at  6:15 PM EDT by a video-enabled telemedicine application and verified that I am speaking with the correct person using two identifiers.  Location: Patient: Virtual Visit Location Patient:  Other: Parked car Provider: Virtual Visit Location Provider: Home Office   I discussed the limitations of evaluation and management by telemedicine and the availability of in person appointments. The patient expressed understanding and agreed to proceed.    History of Present Illness: Dawn Horton is a 36 y.o. who identifies as a female who was assigned female at birth, and is being seen today for pulling a muscle in her chest wall and left shoulder today while lifting a heavy box.  Noted some initial pain with muscle spasm.  Was pretty significant so had to leave work.  Notes she took some Aleve and did not hold muscle relaxer she still had leftover pills of.  Notes she laid down and rested.  Feels much better than she did earlier.  Notes normal range of motion of her left shoulder and neck.  Residual pain is about a 1 out of 10 and not affected at present by motion or position.  HPI: HPI  Problems:  Patient Active Problem List   Diagnosis Date Noted   Bipolar affective disorder, current episode manic with psychotic symptoms (HCC) 05/21/2016    Allergies:  Allergies  Allergen Reactions   Abilify [Aripiprazole] Other (See Comments)    Makes pt delirious and forget things.    Ibuprofen Nausea Only   Penicillins Hives    amoxicillin   Medications:  Current Outpatient Medications:    azelastine (OPTIVAR) 0.05 % ophthalmic solution, Place 1 drop into both eyes 2 (two) times daily., Disp: 6 mL, Rfl:  1   cyclobenzaprine (FLEXERIL) 5 MG tablet, Take 5-10 mg by mouth 3 (three) times daily as needed., Disp: , Rfl:    fluticasone (FLONASE) 50 MCG/ACT nasal spray, SPRAY 1 SPRAY INTO BOTH NOSTRILS DAILY., Disp: 16 mL, Rfl: 2   folic acid (FOLVITE) 1 MG tablet, Take 1 mg by mouth daily., Disp: , Rfl:    levocetirizine (XYZAL) 5 MG tablet, Take 1 tablet (5 mg total) by mouth every evening., Disp: 30 tablet, Rfl: 1   lithium carbonate (ESKALITH) 450 MG CR tablet, Take 450 mg by mouth daily. , Disp: ,  Rfl:    methotrexate (RHEUMATREX) 2.5 MG tablet, Take by mouth., Disp: , Rfl:    ondansetron (ZOFRAN-ODT) 4 MG disintegrating tablet, Take 1 tablet (4 mg total) by mouth every 8 (eight) hours as needed for nausea or vomiting., Disp: 20 tablet, Rfl: 0   risperiDONE (RISPERDAL) 2 MG tablet, Take 1 tablet (2 mg total) by mouth at bedtime., Disp: 14 tablet, Rfl: 0  Observations/Objective: Patient is well-developed, well-nourished in no acute distress.  Resting comfortably in parked car.   Head is normocephalic, atraumatic.  No labored breathing. Speech is clear and coherent with logical content.  Patient is alert and oriented at baseline.  Normal range of motion of left shoulder and upper extremity.  Assessment and Plan: 1. Muscle strain  Due to some heavy lifting.  Thankfully has already calmed down substantially with OTC NSAIDs and a prior tablet of cyclobenzaprine.  No alarm signs or symptoms present.  Supportive measures and OTC medications reviewed.  We will send in her refill of a few tablets of cyclobenzaprine to use as directed if needed for residual tension or spasm.  Work note provided.  She thankfully is off the next few days.  Should be able to return to work on Monday without restrictions.  Strict urgent care/ER precautions reviewed with patient who voiced understanding and agreement with the plan.  Follow Up Instructions: I discussed the assessment and treatment plan with the patient. The patient was provided an opportunity to ask questions and all were answered. The patient agreed with the plan and demonstrated an understanding of the instructions.  A copy of instructions were sent to the patient via MyChart unless otherwise noted below.    The patient was advised to call back or seek an in-person evaluation if the symptoms worsen or if the condition fails to improve as anticipated.  Time:  I spent 12 minutes with the patient via telehealth technology discussing the above  problems/concerns.    [name], PA-C

## 2021-10-31 NOTE — Patient Instructions (Signed)
  [name], thank you for joining [name], PA-C for today's virtual visit.  While this provider is not your primary care provider (PCP), if your PCP is located in our provider database this encounter information will be shared with them immediately following your visit.  Consent: (Patient) Dawn Horton provided verbal consent for this virtual visit at the beginning of the encounter.  Current Medications:  Current Outpatient Medications:    azelastine (OPTIVAR) 0.05 % ophthalmic solution, Place 1 drop into both eyes 2 (two) times daily., Disp: 6 mL, Rfl: 1   cyclobenzaprine (FLEXERIL) 5 MG tablet, Take 5-10 mg by mouth 3 (three) times daily as needed., Disp: , Rfl:    fluticasone (FLONASE) 50 MCG/ACT nasal spray, SPRAY 1 SPRAY INTO BOTH NOSTRILS DAILY., Disp: 16 mL, Rfl: 2   folic acid (FOLVITE) 1 MG tablet, Take 1 mg by mouth daily., Disp: , Rfl:    levocetirizine (XYZAL) 5 MG tablet, Take 1 tablet (5 mg total) by mouth every evening., Disp: 30 tablet, Rfl: 1   lithium carbonate (ESKALITH) 450 MG CR tablet, Take 450 mg by mouth daily. , Disp: , Rfl:    methotrexate (RHEUMATREX) 2.5 MG tablet, Take by mouth., Disp: , Rfl:    ondansetron (ZOFRAN-ODT) 4 MG disintegrating tablet, Take 1 tablet (4 mg total) by mouth every 8 (eight) hours as needed for nausea or vomiting., Disp: 20 tablet, Rfl: 0   risperiDONE (RISPERDAL) 2 MG tablet, Take 1 tablet (2 mg total) by mouth at bedtime., Disp: 14 tablet, Rfl: 0   Medications ordered in this encounter:  No orders of the defined types were placed in this encounter.    *If you need refills on other medications prior to your next appointment, please contact your pharmacy*  Follow-Up: Call back or seek an in-person evaluation if the symptoms worsen or if the condition fails to improve as anticipated.  Other Instructions Please avoid heavy lifting or overexertion. It is okay to continue Aleve every 12 hours as needed. You can use Tylenol  over-the-counter in between these doses. I have sent in a short prescription of the muscle relaxant, just in case further tablets are needed. Do not drive or operate heavy machinery while taking this medicine. I suspect symptoms should only continue to improve until fully resolved, but if you note any new or worsening symptoms despite treatment, I want you to seek an in person evaluation. Please do not delay care.   If you have been instructed to have an in-person evaluation today at a local Urgent Care facility, please use the link below. It will take you to a list of all of our available [facility], including address, phone number and hours of operation. Please do not delay care.  [facility]  If you or a family member do not have a primary care provider, use the link below to schedule a visit and establish care. When you choose a [facility] primary care physician or advanced practice provider, you gain a long-term partner in health. Find a Primary Care Provider  Learn more about [facility]'s in-office and virtual care options: [facility] - Get Care Now

## 2021-11-04 ENCOUNTER — Telehealth: Payer: Medicaid Other | Admitting: Physician Assistant

## 2021-11-04 DIAGNOSIS — M25562 Pain in left knee: Secondary | ICD-10-CM | POA: Diagnosis not present

## 2021-11-04 NOTE — Patient Instructions (Signed)
  Dawn Horton, thank you for joining [name], PA-C for today's virtual visit.  While this provider is not your primary care provider (PCP), if your PCP is located in our provider database this encounter information will be shared with them immediately following your visit.  Consent: (Patient) Dawn Horton provided verbal consent for this virtual visit at the beginning of the encounter.  Current Medications:  Current Outpatient Medications:    azelastine (OPTIVAR) 0.05 % ophthalmic solution, Place 1 drop into both eyes 2 (two) times daily., Disp: 6 mL, Rfl: 1   cyclobenzaprine (FLEXERIL) 5 MG tablet, Take 1 tablet (5 mg total) by mouth 3 (three) times daily as needed for muscle spasms., Disp: 10 tablet, Rfl: 0   fluticasone (FLONASE) 50 MCG/ACT nasal spray, SPRAY 1 SPRAY INTO BOTH NOSTRILS DAILY., Disp: 16 mL, Rfl: 2   folic acid (FOLVITE) 1 MG tablet, Take 1 mg by mouth daily., Disp: , Rfl:    levocetirizine (XYZAL) 5 MG tablet, Take 1 tablet (5 mg total) by mouth every evening., Disp: 30 tablet, Rfl: 1   lithium carbonate (ESKALITH) 450 MG CR tablet, Take 450 mg by mouth daily. , Disp: , Rfl:    methotrexate (RHEUMATREX) 2.5 MG tablet, Take by mouth., Disp: , Rfl:    ondansetron (ZOFRAN-ODT) 4 MG disintegrating tablet, Take 1 tablet (4 mg total) by mouth every 8 (eight) hours as needed for nausea or vomiting., Disp: 20 tablet, Rfl: 0   risperiDONE (RISPERDAL) 2 MG tablet, Take 1 tablet (2 mg total) by mouth at bedtime., Disp: 14 tablet, Rfl: 0   Medications ordered in this encounter:  No orders of the defined types were placed in this encounter.    *If you need refills on other medications prior to your next appointment, please contact your pharmacy*  Follow-Up: Call back or seek an in-person evaluation if the symptoms worsen or if the condition fails to improve as anticipated.  Other Instructions Ice and elevate the leg. Ok to continue Tylenol and Celebrex. I am glad you  are feeling much better. If this does not continue to resolve or you note any new/worsening symptoms, you need to be evaluated in person. DO NOT DELAY CARE.   If you have been instructed to have an in-person evaluation today at a local Urgent Care facility, please use the link below. It will take you to a list of all of our available [facility], including address, phone number and hours of operation. Please do not delay care.  [facility]  If you or a family member do not have a primary care provider, use the link below to schedule a visit and establish care. When you choose a [facility] primary care physician or advanced practice provider, you gain a long-term partner in health. Find a Primary Care Provider  Learn more about [facility]'s in-office and virtual care options: [facility]

## 2021-11-04 NOTE — Progress Notes (Signed)
Virtual Visit Consent   Dawn Horton, you are scheduled for a virtual visit with a [facility] provider today. Just as with appointments in the office, your consent must be obtained to participate. Your consent will be active for this visit and any virtual visit you may have with one of our providers in the next 365 days. If you have a MyChart account, a copy of this consent can be sent to you electronically.  As this is a virtual visit, video technology does not allow for your provider to perform a traditional examination. This may limit your provider's ability to fully assess your condition. If your provider identifies any concerns that need to be evaluated in person or the need to arrange testing (such as labs, EKG, etc.), we will make arrangements to do so. Although advances in technology are sophisticated, we cannot ensure that it will always work on either your end or our end. If the connection with a video visit is poor, the visit may have to be switched to a telephone visit. With either a video or telephone visit, we are not always able to ensure that we have a secure connection.  By engaging in this virtual visit, you consent to the provision of healthcare and authorize for your insurance to be billed (if applicable) for the services provided during this visit. Depending on your insurance coverage, you may receive a charge related to this service.  I need to obtain your verbal consent now. Are you willing to proceed with your visit today? [name] has provided verbal consent on 11/04/2021 for a virtual visit (video or telephone). Dawn Horton, [place]  Date: 11/04/2021 5:39 PM  Virtual Visit via Video Note   I, Dawn Horton, connected with  Dawn Horton  ([id number], 03-Jul-1985) on 11/04/21 at  5:30 PM EDT by a video-enabled telemedicine application and verified that I am speaking with the correct person using two identifiers.  Location: Patient: Virtual Visit Location Patient:  Home Provider: Virtual Visit Location Provider: Home Office   I discussed the limitations of evaluation and management by telemedicine and the availability of in person appointments. The patient expressed understanding and agreed to proceed.    History of Present Illness: Dawn Horton is a 36 y.o. who identifies as a female who was assigned female at birth, and is being seen today for L knee pain, described as throbbing. Has history of knee arthritis. Denies trauma or injury. Thinks she may have slept in a weird position. She notes taking some Celebrex given to her by this provider at prior visit for trapezius strain which helped considerably. Also took some OTC Tylenol. Notes only residual minute soreness remains but was not able to work today and they are requiring a note.   HPI: HPI  Problems:  Patient Active Problem List   Diagnosis Date Noted   Bipolar affective disorder, current episode manic with psychotic symptoms ([place]) 05/21/2016    Allergies:  Allergies  Allergen Reactions   Abilify [Aripiprazole] Other (See Comments)    Makes pt delirious and forget things.    Ibuprofen Nausea Only   Penicillins Hives    amoxicillin   Medications:  Current Outpatient Medications:    azelastine (OPTIVAR) 0.05 % ophthalmic solution, Place 1 drop into both eyes 2 (two) times daily., Disp: 6 mL, Rfl: 1   cyclobenzaprine (FLEXERIL) 5 MG tablet, Take 1 tablet (5 mg total) by mouth 3 (three) times daily as needed for muscle spasms., Disp: 10 tablet, Rfl: 0  fluticasone (FLONASE) 50 MCG/ACT nasal spray, SPRAY 1 SPRAY INTO BOTH NOSTRILS DAILY., Disp: 16 mL, Rfl: 2   folic acid (FOLVITE) 1 MG tablet, Take 1 mg by mouth daily., Disp: , Rfl:    levocetirizine (XYZAL) 5 MG tablet, Take 1 tablet (5 mg total) by mouth every evening., Disp: 30 tablet, Rfl: 1   lithium carbonate (ESKALITH) 450 MG CR tablet, Take 450 mg by mouth daily. , Disp: , Rfl:    methotrexate (RHEUMATREX) 2.5 MG tablet, Take by  mouth., Disp: , Rfl:    ondansetron (ZOFRAN-ODT) 4 MG disintegrating tablet, Take 1 tablet (4 mg total) by mouth every 8 (eight) hours as needed for nausea or vomiting., Disp: 20 tablet, Rfl: 0   risperiDONE (RISPERDAL) 2 MG tablet, Take 1 tablet (2 mg total) by mouth at bedtime., Disp: 14 tablet, Rfl: 0  Observations/Objective: Patient is well-developed, well-nourished in no acute distress.  Resting comfortably at home.  Head is normocephalic, atraumatic.  No labored breathing. Speech is clear and coherent with logical content.  Patient is alert and oriented at baseline.   Assessment and Plan: 1. Acute pain of left knee  RICE. Continue Celebrex and Tylenol. Symptoms already mostly resolved. Ambulate without restriction. Work note provided. If recurring needs in-person evaluation  Follow Up Instructions: I discussed the assessment and treatment plan with the patient. The patient was provided an opportunity to ask questions and all were answered. The patient agreed with the plan and demonstrated an understanding of the instructions.  A copy of instructions were sent to the patient via MyChart unless otherwise noted below.   The patient was advised to call back or seek an in-person evaluation if the symptoms worsen or if the condition fails to improve as anticipated.  Time:  I spent 8 minutes with the patient via telehealth technology discussing the above problems/concerns.    [name], PA-C

## 2021-11-07 ENCOUNTER — Telehealth: Payer: Medicaid Other | Admitting: Physician Assistant

## 2021-11-07 NOTE — Progress Notes (Signed)
The patient no-showed for appointment despite this provider sending direct link, reaching out via phone with no response and waiting for at least 10 minutes from appointment time for patient to join. They will be marked as a NS for this appointment/time.  ? ?Joyceline Maiorino Cody Bette Brienza, PA-C ? ? ? ?

## 2021-11-11 ENCOUNTER — Telehealth: Payer: Medicaid Other | Admitting: Physician Assistant

## 2021-11-11 DIAGNOSIS — R252 Cramp and spasm: Secondary | ICD-10-CM

## 2021-11-11 NOTE — Progress Notes (Signed)
Virtual Visit Consent   Dawn Horton, you are scheduled for a virtual visit with a [facility] provider today. Just as with appointments in the office, your consent must be obtained to participate. Your consent will be active for this visit and any virtual visit you may have with one of our providers in the next 365 days. If you have a MyChart account, a copy of this consent can be sent to you electronically.  As this is a virtual visit, video technology does not allow for your provider to perform a traditional examination. This may limit your provider's ability to fully assess your condition. If your provider identifies any concerns that need to be evaluated in person or the need to arrange testing (such as labs, EKG, etc.), we will make arrangements to do so. Although advances in technology are sophisticated, we cannot ensure that it will always work on either your end or our end. If the connection with a video visit is poor, the visit may have to be switched to a telephone visit. With either a video or telephone visit, we are not always able to ensure that we have a secure connection.  By engaging in this virtual visit, you consent to the provision of healthcare and authorize for your insurance to be billed (if applicable) for the services provided during this visit. Depending on your insurance coverage, you may receive a charge related to this service.  I need to obtain your verbal consent now. Are you willing to proceed with your visit today? Myron Lona has provided verbal consent on 11/11/2021 for a virtual visit (video or telephone). Mar Daring, PA-C  Date: 11/11/2021 7:08 PM  Virtual Visit via Video Note   I, Mar Daring, connected with  Dawn Horton  ([id number], 1985-11-12) on 11/11/21 at  7:00 PM EDT by a video-enabled telemedicine application and verified that I am speaking with the correct person using two identifiers.  Location: Patient: Virtual Visit Location Patient:  Home Provider: Virtual Visit Location Provider: Home Office   I discussed the limitations of evaluation and management by telemedicine and the availability of in person appointments. The patient expressed understanding and agreed to proceed.    History of Present Illness: Dawn Horton is a 36 y.o. who identifies as a female who was assigned female at birth, and is being seen today for right butt cheek cramped up last night. Has happened before. Tried to go to work today, but had to leave early due to pain. Has been using Flexeril, which she has a prescription for, and Aleve. She does have Celebrex 100mg  as well, but has not taken any as she did not know if it would interact with the muscle relaxer. She does report improvement with the muscle relaxer, aleve, and tylenol.    Problems:  Patient Active Problem List   Diagnosis Date Noted   Bipolar affective disorder, current episode manic with psychotic symptoms ([facility]) 05/21/2016    Allergies:  Allergies  Allergen Reactions   Abilify [Aripiprazole] Other (See Comments)    Makes pt delirious and forget things.    Ibuprofen Nausea Only   Penicillins Hives    amoxicillin   Medications:  Current Outpatient Medications:    azelastine (OPTIVAR) 0.05 % ophthalmic solution, Place 1 drop into both eyes 2 (two) times daily., Disp: 6 mL, Rfl: 1   cyclobenzaprine (FLEXERIL) 5 MG tablet, Take 1 tablet (5 mg total) by mouth 3 (three) times daily as needed for muscle spasms., Disp: 10 tablet,  Rfl: 0   fluticasone (FLONASE) 50 MCG/ACT nasal spray, SPRAY 1 SPRAY INTO BOTH NOSTRILS DAILY., Disp: 16 mL, Rfl: 2   folic acid (FOLVITE) 1 MG tablet, Take 1 mg by mouth daily., Disp: , Rfl:    levocetirizine (XYZAL) 5 MG tablet, Take 1 tablet (5 mg total) by mouth every evening., Disp: 30 tablet, Rfl: 1   lithium carbonate (ESKALITH) 450 MG CR tablet, Take 450 mg by mouth daily. , Disp: , Rfl:    methotrexate (RHEUMATREX) 2.5 MG tablet, Take by mouth., Disp: , Rfl:     ondansetron (ZOFRAN-ODT) 4 MG disintegrating tablet, Take 1 tablet (4 mg total) by mouth every 8 (eight) hours as needed for nausea or vomiting., Disp: 20 tablet, Rfl: 0   risperiDONE (RISPERDAL) 2 MG tablet, Take 1 tablet (2 mg total) by mouth at bedtime., Disp: 14 tablet, Rfl: 0  Observations/Objective: Patient is well-developed, well-nourished in no acute distress.  Resting comfortably at home.  Head is normocephalic, atraumatic.  No labored breathing.  Speech is clear and coherent with logical content.  Patient is alert and oriented at baseline.    Assessment and Plan: 1. Muscle cramp  - Continue flexeril, aleve, and tylenol - Advised she can take celebrex when severe, but to avoid Aleve with celebrex; Flexeril is okay - Heat - Stretches - Work note provided  Follow Up Instructions: I discussed the assessment and treatment plan with the patient. The patient was provided an opportunity to ask questions and all were answered. The patient agreed with the plan and demonstrated an understanding of the instructions.  A copy of instructions were sent to the patient via MyChart unless otherwise noted below.    The patient was advised to call back or seek an in-person evaluation if the symptoms worsen or if the condition fails to improve as anticipated.  Time:  I spent 8 minutes with the patient via telehealth technology discussing the above problems/concerns.    Mar Daring, PA-C

## 2021-11-11 NOTE — Patient Instructions (Signed)
Norva Riffle, thank you for joining Mar Daring, PA-C for today's virtual visit.  While this provider is not your primary care provider (PCP), if your PCP is located in our provider database this encounter information will be shared with them immediately following your visit.  Consent: (Patient) Dawn Horton provided verbal consent for this virtual visit at the beginning of the encounter.  Current Medications:  Current Outpatient Medications:    azelastine (OPTIVAR) 0.05 % ophthalmic solution, Place 1 drop into both eyes 2 (two) times daily., Disp: 6 mL, Rfl: 1   cyclobenzaprine (FLEXERIL) 5 MG tablet, Take 1 tablet (5 mg total) by mouth 3 (three) times daily as needed for muscle spasms., Disp: 10 tablet, Rfl: 0   fluticasone (FLONASE) 50 MCG/ACT nasal spray, SPRAY 1 SPRAY INTO BOTH NOSTRILS DAILY., Disp: 16 mL, Rfl: 2   folic acid (FOLVITE) 1 MG tablet, Take 1 mg by mouth daily., Disp: , Rfl:    levocetirizine (XYZAL) 5 MG tablet, Take 1 tablet (5 mg total) by mouth every evening., Disp: 30 tablet, Rfl: 1   lithium carbonate (ESKALITH) 450 MG CR tablet, Take 450 mg by mouth daily. , Disp: , Rfl:    methotrexate (RHEUMATREX) 2.5 MG tablet, Take by mouth., Disp: , Rfl:    ondansetron (ZOFRAN-ODT) 4 MG disintegrating tablet, Take 1 tablet (4 mg total) by mouth every 8 (eight) hours as needed for nausea or vomiting., Disp: 20 tablet, Rfl: 0   risperiDONE (RISPERDAL) 2 MG tablet, Take 1 tablet (2 mg total) by mouth at bedtime., Disp: 14 tablet, Rfl: 0   Medications ordered in this encounter:  No orders of the defined types were placed in this encounter.    *If you need refills on other medications prior to your next appointment, please contact your pharmacy*  Follow-Up: Call back or seek an in-person evaluation if the symptoms worsen or if the condition fails to improve as anticipated.  [facility] [phone]  Other Instructions Muscle Cramps and Spasms Muscle  cramps and spasms occur when a muscle or muscles tighten and you have no control over this tightening (involuntary muscle contraction). They are a common problem and can develop in any muscle. The most common place is in the calf muscles of the leg. Muscle cramps and muscle spasms are both involuntary muscle contractions, but there are some differences between the two: Muscle cramps are painful. They come and go and may last for a few seconds or up to 15 minutes. Muscle cramps are often more forceful and last longer than muscle spasms. Muscle spasms may or may not be painful. They may also last just a few seconds or much longer. Certain medical conditions, such as diabetes or Parkinson's disease, can make it more likely to develop cramps or spasms. However, cramps or spasms are usually not caused by a serious underlying problem. Common causes include: Doing more physical work or exercise than your body is ready for (overexertion). Overuse from repeating certain movements too many times. Remaining in a certain position for a long period of time. Improper preparation, form, or technique while playing a sport or doing an activity. Dehydration. Injury. Side effects of some medicines. Abnormally low levels of the salts and minerals in your blood (electrolytes), especially potassium and calcium. This could happen if you are taking water pills (diuretics) or if you are pregnant. In many cases, the cause of muscle cramps or spasms is not known. Follow these instructions at home: Managing pain and stiffness  Try massaging, stretching, and relaxing the affected muscle. Do this for several minutes at a time. If directed, apply heat to tight or tense muscles as often as told by your health care provider. Use the heat source that your health care provider recommends, such as a moist heat pack or a heating pad. Place a towel between your skin and the heat source. Leave the heat on for 20-30  minutes. Remove the heat if your skin turns bright red. This is especially important if you are unable to feel pain, heat, or cold. You may have a greater risk of getting burned. If directed, put ice on the affected area. This may help if you are sore or have pain after a cramp or spasm. Put ice in a plastic bag. Place a towel between your skin and the bag. Leave the ice on for 20 minutes, 2-3 times a day. Try taking hot showers or baths to help relax tight muscles. Eating and drinking Drink enough fluid to keep your urine pale yellow. Staying well hydrated may help prevent cramps or spasms. Eat a healthy diet that includes plenty of nutrients to help your muscles function. A healthy diet includes fruits and vegetables, lean protein, whole grains, and low-fat or nonfat dairy products. General instructions If you are having frequent cramps, avoid intense exercise for several days. Take over-the-counter and prescription medicines only as told by your health care provider. Pay attention to any changes in your symptoms. Keep all follow-up visits as told by your health care provider. This is important. Contact a health care provider if: Your cramps or spasms get more severe or happen more often. Your cramps or spasms do not improve over time. Summary Muscle cramps and spasms occur when a muscle or muscles tighten and you have no control over this tightening (involuntary muscle contraction). The most common place for cramps or spasms to occur is in the calf muscles of the leg. Massaging, stretching, and relaxing the affected muscle may relieve the cramp or spasm. Drink enough fluid to keep your urine pale yellow. Staying well hydrated may help prevent cramps or spasms. This information is not intended to replace advice given to you by your health care provider. Make sure you discuss any questions you have with your health care provider. Document Revised: 08/24/2020 Document Reviewed:  08/24/2020 Elsevier Patient Education  [place].    If you have been instructed to have an in-person evaluation today at a local Urgent Care facility, please use the link below. It will take you to a list of all of our available [facility], including address, phone number and hours of operation. Please do not delay care.  [facility]  If you or a family member do not have a primary care provider, use the link below to schedule a visit and establish care. When you choose a [facility] primary care physician or advanced practice provider, you gain a long-term partner in health. Find a Primary Care Provider  Learn more about [facility] in-office and virtual care options: [facility]

## 2021-11-13 ENCOUNTER — Other Ambulatory Visit: Payer: Self-pay | Admitting: Sports Medicine

## 2021-11-13 DIAGNOSIS — H5789 Other specified disorders of eye and adnexa: Secondary | ICD-10-CM

## 2021-11-18 ENCOUNTER — Telehealth: Payer: Medicaid Other | Admitting: Physician Assistant

## 2021-11-18 DIAGNOSIS — M79644 Pain in right finger(s): Secondary | ICD-10-CM

## 2021-11-18 NOTE — Patient Instructions (Signed)
  Norva Riffle, thank you for joining [name], PA-C for today's virtual visit.  While this provider is not your primary care provider (PCP), if your PCP is located in our provider database this encounter information will be shared with them immediately following your visit.  Consent: (Patient) Dawn Horton provided verbal consent for this virtual visit at the beginning of the encounter.  Current Medications:  Current Outpatient Medications:    azelastine (OPTIVAR) 0.05 % ophthalmic solution, Place 1 drop into both eyes 2 (two) times daily., Disp: 6 mL, Rfl: 1   cyclobenzaprine (FLEXERIL) 5 MG tablet, Take 1 tablet (5 mg total) by mouth 3 (three) times daily as needed for muscle spasms., Disp: 10 tablet, Rfl: 0   fluticasone (FLONASE) 50 MCG/ACT nasal spray, SPRAY 1 SPRAY INTO BOTH NOSTRILS DAILY., Disp: 16 mL, Rfl: 2   folic acid (FOLVITE) 1 MG tablet, Take 1 mg by mouth daily., Disp: , Rfl:    levocetirizine (XYZAL) 5 MG tablet, Take 1 tablet (5 mg total) by mouth every evening., Disp: 30 tablet, Rfl: 1   lithium carbonate (ESKALITH) 450 MG CR tablet, Take 450 mg by mouth daily. , Disp: , Rfl:    methotrexate (RHEUMATREX) 2.5 MG tablet, Take by mouth., Disp: , Rfl:    ondansetron (ZOFRAN-ODT) 4 MG disintegrating tablet, Take 1 tablet (4 mg total) by mouth every 8 (eight) hours as needed for nausea or vomiting., Disp: 20 tablet, Rfl: 0   risperiDONE (RISPERDAL) 2 MG tablet, Take 1 tablet (2 mg total) by mouth at bedtime., Disp: 14 tablet, Rfl: 0   Medications ordered in this encounter:  No orders of the defined types were placed in this encounter.    *If you need refills on other medications prior to your next appointment, please contact your pharmacy*  Follow-Up: Call back or seek an in-person evaluation if the symptoms worsen or if the condition fails to improve as anticipated.  [facility] [phone]  Other Instructions   If you have been instructed to  have an in-person evaluation today at a local Urgent Care facility, please use the link below. It will take you to a list of all of our available [facility], including address, phone number and hours of operation. Please do not delay care.  [facility]  If you or a family member do not have a primary care provider, use the link below to schedule a visit and establish care. When you choose a [facility] primary care physician or advanced practice provider, you gain a long-term partner in health. Find a Primary Care Provider  Learn more about [facility] in-office and virtual care options: [facility]

## 2021-11-18 NOTE — Progress Notes (Signed)
Virtual Visit Consent   Dawn Horton, you are scheduled for a virtual visit with a [facility] provider today. Just as with appointments in the office, your consent must be obtained to participate. Your consent will be active for this visit and any virtual visit you may have with one of our providers in the next 365 days. If you have a MyChart account, a copy of this consent can be sent to you electronically.  As this is a virtual visit, video technology does not allow for your provider to perform a traditional examination. This may limit your provider's ability to fully assess your condition. If your provider identifies any concerns that need to be evaluated in person or the need to arrange testing (such as labs, EKG, etc.), we will make arrangements to do so. Although advances in technology are sophisticated, we cannot ensure that it will always work on either your end or our end. If the connection with a video visit is poor, the visit may have to be switched to a telephone visit. With either a video or telephone visit, we are not always able to ensure that we have a secure connection.  By engaging in this virtual visit, you consent to the provision of healthcare and authorize for your insurance to be billed (if applicable) for the services provided during this visit. Depending on your insurance coverage, you may receive a charge related to this service.  I need to obtain your verbal consent now. Are you willing to proceed with your visit today? Charlann Wayne has provided verbal consent on 11/18/2021 for a virtual visit (video or telephone). [name], [place]  Date: 11/18/2021 4:56 PM  Virtual Visit via Video Note   I, [name], connected with  Pakou Rainbow  ([id number], 04-Dec-1985) on 11/18/21 at  4:45 PM EDT by a video-enabled telemedicine application and verified that I am speaking with the correct person using two identifiers.  Location: Patient: Virtual Visit Location Patient:  Home Provider: Virtual Visit Location Provider: Home Office   I discussed the limitations of evaluation and management by telemedicine and the availability of in person appointments. The patient expressed understanding and agreed to proceed.    History of Present Illness: Blue Ruggerio is a 36 y.o. who identifies as a female who was assigned female at birth, and is being seen today for pain of R middle finger noted on waking this morning with mild swelling. Notes she slept on the hand last night and feels it could be related. Notes was bothering her more while working so she had to leave work early today. Came home and took her prescribed Celebrex with substantial improvement. Only mild residual soreness. Swelling mostly resolved. Notes normal ROM. Denies loss of sensation. Denies swelling or pain of other fingers. Denies fever, chills.  Of note it seems she has had multiple visits the past month for MSK issues, oftentimes having to leave work the same day and need for work note. Discussed she will need to follow-up with PCP for any new or ongoing MSK issues.  HPI: HPI  Problems:  Patient Active Problem List   Diagnosis Date Noted   Bipolar affective disorder, current episode manic with psychotic symptoms ([facility]) 05/21/2016    Allergies:  Allergies  Allergen Reactions   Abilify [Aripiprazole] Other (See Comments)    Makes pt delirious and forget things.    Ibuprofen Nausea Only   Penicillins Hives    amoxicillin   Medications:  Current Outpatient Medications:  azelastine (OPTIVAR) 0.05 % ophthalmic solution, Place 1 drop into both eyes 2 (two) times daily., Disp: 6 mL, Rfl: 1   cyclobenzaprine (FLEXERIL) 5 MG tablet, Take 1 tablet (5 mg total) by mouth 3 (three) times daily as needed for muscle spasms., Disp: 10 tablet, Rfl: 0   fluticasone (FLONASE) 50 MCG/ACT nasal spray, SPRAY 1 SPRAY INTO BOTH NOSTRILS DAILY., Disp: 16 mL, Rfl: 2   folic acid (FOLVITE) 1 MG tablet, Take 1 mg by mouth  daily., Disp: , Rfl:    levocetirizine (XYZAL) 5 MG tablet, Take 1 tablet (5 mg total) by mouth every evening., Disp: 30 tablet, Rfl: 1   lithium carbonate (ESKALITH) 450 MG CR tablet, Take 450 mg by mouth daily. , Disp: , Rfl:    methotrexate (RHEUMATREX) 2.5 MG tablet, Take by mouth., Disp: , Rfl:    ondansetron (ZOFRAN-ODT) 4 MG disintegrating tablet, Take 1 tablet (4 mg total) by mouth every 8 (eight) hours as needed for nausea or vomiting., Disp: 20 tablet, Rfl: 0   risperiDONE (RISPERDAL) 2 MG tablet, Take 1 tablet (2 mg total) by mouth at bedtime., Disp: 14 tablet, Rfl: 0  Observations/Objective: Patient is well-developed, well-nourished in no acute distress.  Resting comfortably at home.  Head is normocephalic, atraumatic.  No labored breathing. Speech is clear and coherent with logical content.  Patient is alert and oriented at baseline.   Assessment and Plan: 1. Finger pain, right  Mild inflammation and swelling. Resolved with Celebrex and rest. Work note provided. Discussed need for PCP follow-up for her fluctuating musculoskeletal symptoms.   Follow Up Instructions: I discussed the assessment and treatment plan with the patient. The patient was provided an opportunity to ask questions and all were answered. The patient agreed with the plan and demonstrated an understanding of the instructions.  A copy of instructions were sent to the patient via MyChart unless otherwise noted below.   The patient was advised to call back or seek an in-person evaluation if the symptoms worsen or if the condition fails to improve as anticipated.  Time:  I spent 10 minutes with the patient via telehealth technology discussing the above problems/concerns.    [name], PA-C

## 2021-12-10 ENCOUNTER — Telehealth: Payer: Medicaid Other | Admitting: Nurse Practitioner

## 2021-12-10 DIAGNOSIS — H1013 Acute atopic conjunctivitis, bilateral: Secondary | ICD-10-CM | POA: Diagnosis not present

## 2021-12-10 MED ORDER — AZELASTINE HCL 0.05 % OP SOLN: 1.0000 [drp] | Freq: Two times a day (BID) | OPHTHALMIC | 1 refills | Status: DC

## 2021-12-10 MED ORDER — CETIRIZINE HCL 10 MG PO TABS: 10.0000 mg | ORAL_TABLET | Freq: Every day | ORAL | 2 refills | Status: AC

## 2021-12-10 NOTE — Patient Instructions (Signed)
Allergic Rhinitis, Adult Allergic rhinitis is a reaction to allergens. Allergens are things that can cause an allergic reaction. This condition affects the lining inside the nose (mucous membrane). There are two types of allergic rhinitis: Seasonal. This type is also called hay fever. It happens only during some times of the year. Perennial. This type can happen at any time of the year. This condition cannot be spread from person to person (is not contagious). It can be mild, worse, or very bad. It can develop at any age and may be outgrown. What are the causes? This condition may be caused by: Pollen from grasses, trees, and weeds. Dust mites. Smoke. Mold. Car fumes. The pee (urine), spit, or dander of pets. Dander is dead skin cells from a pet. What increases the risk? You are more likely to develop this condition if: You have allergies in your family. You have problems like allergies in your family. You may have: Swelling of parts of your eyes and eyelids. Asthma. This affects how you breathe. Long-term redness and swelling on your skin. Food allergies. What are the signs or symptoms? The main symptom of this condition is a runny or stuffy nose (nasal congestion). Other symptoms may include: Sneezing or coughing. Itching and tearing of your eyes. Mucus that drips down the back of your throat (postnasal drip). Trouble sleeping. Feeling tired. Headache. Sore throat. How is this treated? There is no cure for this condition. You should avoid things that you are allergic to. Treatment can help to relieve symptoms. This may include: Medicines that block allergy symptoms, such as corticosteroids or antihistamines. These may be given as a shot, nasal spray, or pill. Avoiding things you are allergic to. Medicines that give you bits of what you are allergic to over time. This is called immunotherapy. It is done if other treatments do not help. You may get: Shots. Medicine under your  tongue. Stronger medicines, if other treatments do not help. Follow these instructions at home: Avoiding allergens Find out what things you are allergic to and avoid them. To do this, try these things: If you get allergies any time of year: Replace carpet with wood, tile, or vinyl flooring. Carpet can trap pet dander and dust. Do not smoke. Do not allow smoking in your home. Change your heating and air conditioning filters at least once a month. If you get allergies only some times of the year: Keep windows closed when you can. Plan things to do outside when pollen counts are lowest. Check pollen counts before you plan things to do outside. When you come indoors, change your clothes and shower before you sit on furniture or bedding. If you are allergic to a pet: Keep the pet out of your bedroom. Vacuum, sweep, and dust often. General instructions Take over-the-counter and prescription medicines only as told by your doctor. Drink enough fluid to keep your pee (urine) pale yellow. Keep all follow-up visits as told by your doctor. This is important. Where to find more information American Academy of Allergy, Asthma & Immunology: www.aaaai.org Contact a doctor if: You have a fever. You get a cough that does not go away. You make whistling sounds when you breathe (wheeze). Your symptoms slow you down. Your symptoms stop you from doing your normal things each day. Get help right away if: You are short of breath. This symptom may be an emergency. Do not wait to see if the symptom will go away. Get medical help right away. Call your local emergency   services (911 in the [place] Do not drive yourself to the hospital. Summary Allergic rhinitis may be treated by taking medicines and avoiding things you are allergic to. If you have allergies only some of the year, keep windows closed when you can at those times. Contact your doctor if you get a fever or a cough that does not go away. This  information is not intended to replace advice given to you by your health care provider. Make sure you discuss any questions you have with your health care provider. Document Revised: 07/18/2021 Document Reviewed: 02/01/2019 Elsevier Patient Education  2023 Elsevier Inc.  

## 2021-12-10 NOTE — Progress Notes (Signed)
Virtual Visit Consent   Dawn Horton, you are scheduled for a virtual visit with Dawn Daphine Deutscher, FNP, a [facility] provider, today.     Just as with appointments in the office, your consent must be obtained to participate.  Your consent will be active for this visit and any virtual visit you may have with one of our providers in the next 365 days.     If you have a MyChart account, a copy of this consent can be sent to you electronically.  All virtual visits are billed to your insurance company just like a traditional visit in the office.    As this is a virtual visit, video technology does not allow for your provider to perform a traditional examination.  This may limit your provider's ability to fully assess your condition.  If your provider identifies any concerns that need to be evaluated in person or the need to arrange testing (such as labs, EKG, etc.), we will make arrangements to do so.     Although advances in technology are sophisticated, we cannot ensure that it will always work on either your end or our end.  If the connection with a video visit is poor, the visit may have to be switched to a telephone visit.  With either a video or telephone visit, we are not always able to ensure that we have a secure connection.     I need to obtain your verbal consent now.   Are you willing to proceed with your visit today? YES   Dawn Horton has provided verbal consent on 12/10/2021 for a virtual visit (video or telephone).   Dawn Daphine Deutscher, FNP   Date: 12/10/2021 7:24 PM   Virtual Visit via Video Note   I, Dawn Horton, connected with Dawn Horton ([id number], 1985-05-21) on 12/10/21 at  7:30 PM EDT by a video-enabled telemedicine application and verified that I am speaking with the correct person using two identifiers.  Location: Patient: Virtual Visit Location Patient: Home Provider: Virtual Visit Location Provider: Mobile   I discussed the limitations of  evaluation and management by telemedicine and the availability of in person appointments. The patient expressed understanding and agreed to proceed.    History of Present Illness: Dawn Horton is a 36 y.o. who identifies as a female who was assigned female at birth, and is being seen today for allergic rhinitis.  HPI: Patient has flare up of allergic rhinitis. Shehas had sneezing , runny  nose and watery eyes. Throat has been itching today.    Review of Systems  Constitutional:  Negative for chills and fever.  HENT:  Positive for congestion. Negative for sore throat.   Eyes:  Negative for pain, discharge and redness.  Respiratory:  Negative for cough.     Problems:  Patient Active Problem List   Diagnosis Date Noted   Bipolar affective disorder, current episode manic with psychotic symptoms ([facility]) 05/21/2016    Allergies:  Allergies  Allergen Reactions   Abilify [Aripiprazole] Other (See Comments)    Makes pt delirious and forget things.    Ibuprofen Nausea Only   Penicillins Hives    amoxicillin   Medications:  Current Outpatient Medications:    azelastine (OPTIVAR) 0.05 % ophthalmic solution, Place 1 drop into both eyes 2 (two) times daily., Disp: 6 mL, Rfl: 1   cyclobenzaprine (FLEXERIL) 5 MG tablet, Take 1 tablet (5 mg total) by mouth 3 (three) times daily as needed for muscle spasms., Disp: 10 tablet, Rfl: 0  fluticasone (FLONASE) 50 MCG/ACT nasal spray, SPRAY 1 SPRAY INTO BOTH NOSTRILS DAILY., Disp: 16 mL, Rfl: 2   folic acid (FOLVITE) 1 MG tablet, Take 1 mg by mouth daily., Disp: , Rfl:    levocetirizine (XYZAL) 5 MG tablet, Take 1 tablet (5 mg total) by mouth every evening., Disp: 30 tablet, Rfl: 1   lithium carbonate (ESKALITH) 450 MG CR tablet, Take 450 mg by mouth daily. , Disp: , Rfl:    methotrexate (RHEUMATREX) 2.5 MG tablet, Take by mouth., Disp: , Rfl:    ondansetron (ZOFRAN-ODT) 4 MG disintegrating tablet, Take 1 tablet (4 mg total) by mouth every 8 (eight) hours  as needed for nausea or vomiting., Disp: 20 tablet, Rfl: 0   risperiDONE (RISPERDAL) 2 MG tablet, Take 1 tablet (2 mg total) by mouth at bedtime., Disp: 14 tablet, Rfl: 0  Observations/Objective: Patient is well-developed, well-nourished in no acute distress.  Resting comfortably  at home.  Head is normocephalic, atraumatic.  No labored breathing.  Speech is clear and coherent with logical content.  Patient is alert and oriented at baseline.  Raspy voice Bil sclera injection.  Assessment and Plan:  Dawn Horton in today with chief complaint of Allergic Rhinitis    1. Allergic conjunctivitis of both eyes Avoid rubbing eyes Force fluids Avoid allergens - azelastine (OPTIVAR) 0.05 % ophthalmic solution; Place 1 drop into both eyes 2 (two) times daily.  Dispense: 6 mL; Refill: 1 - cetirizine (ZYRTEC) 10 MG tablet; Take 1 tablet (10 mg total) by mouth daily.  Dispense: 30 tablet; Refill: 2    Follow Up Instructions: I discussed the assessment and treatment plan with the patient. The patient was provided an opportunity to ask questions and all were answered. The patient agreed with the plan and demonstrated an understanding of the instructions.  A copy of instructions were sent to the patient via MyChart.  The patient was advised to call back or seek an in-person evaluation if the symptoms worsen or if the condition fails to improve as anticipated.  Time:  I spent 10 minutes with the patient via telehealth technology discussing the above problems/concerns.    Dawn Hassell Done, FNP

## 2021-12-11 NOTE — Addendum Note (Signed)
Addended by: Chevis Pretty on: 12/11/2021 10:01 AM   Modules accepted: Orders

## 2021-12-30 ENCOUNTER — Telehealth: Payer: Medicaid Other | Admitting: Family Medicine

## 2021-12-30 DIAGNOSIS — R252 Cramp and spasm: Secondary | ICD-10-CM

## 2021-12-30 DIAGNOSIS — T148XXA Other injury of unspecified body region, initial encounter: Secondary | ICD-10-CM | POA: Diagnosis not present

## 2021-12-30 NOTE — Progress Notes (Signed)
Virtual Visit Consent   Dawn Horton, you are scheduled for a virtual visit with a [facility] provider today. Just as with appointments in the office, your consent must be obtained to participate. Your consent will be active for this visit and any virtual visit you may have with one of our providers in the next 365 days. If you have a MyChart account, a copy of this consent can be sent to you electronically.  As this is a virtual visit, video technology does not allow for your provider to perform a traditional examination. This may limit your provider's ability to fully assess your condition. If your provider identifies any concerns that need to be evaluated in person or the need to arrange testing (such as labs, EKG, etc.), we will make arrangements to do so. Although advances in technology are sophisticated, we cannot ensure that it will always work on either your end or our end. If the connection with a video visit is poor, the visit may have to be switched to a telephone visit. With either a video or telephone visit, we are not always able to ensure that we have a secure connection.  By engaging in this virtual visit, you consent to the provision of healthcare and authorize for your insurance to be billed (if applicable) for the services provided during this visit. Depending on your insurance coverage, you may receive a charge related to this service.  I need to obtain your verbal consent now. Are you willing to proceed with your visit today? Dawn Horton has provided verbal consent on 12/30/2021 for a virtual visit (video or telephone). Freddy Finner, NP  Date: 12/30/2021 10:07 AM  Virtual Visit via Video Note   I, Freddy Finner, connected with  Dawn Horton  ([id number], 1985/10/28) on 12/30/21 at 10:00 AM EST by a video-enabled telemedicine application and verified that I am speaking with the correct person using two identifiers.  Location: Patient: Virtual Visit Location Patient:  Home Provider: Virtual Visit Location Provider: Home Office   I discussed the limitations of evaluation and management by telemedicine and the availability of in person appointments. The patient expressed understanding and agreed to proceed.    History of Present Illness: Dawn Horton is a 36 y.o. who identifies as a female who was assigned female at birth, and is being seen today for being seen today for pulling a muscle in her left anterior chest, under the left breast, lateral rib cage area. Lifted a 75 pound box at [employer] for [employer]. Noted some spasms. Pain is a 3-4 out of 10. Mostly felt when moving certain positions. Needs a work note for today- has an appt for PCP this afternoon. She took tylenol and flexeril it helped some.   Problems:  Patient Active Problem List   Diagnosis Date Noted   Bipolar affective disorder, current episode manic with psychotic symptoms ([facility]) 05/21/2016    Allergies:  Allergies  Allergen Reactions   Abilify [Aripiprazole] Other (See Comments)    Makes pt delirious and forget things.    Ibuprofen Nausea Only   Penicillins Hives    amoxicillin   Medications:  Current Outpatient Medications:    cetirizine (ZYRTEC) 10 MG tablet, Take 1 tablet (10 mg total) by mouth daily., Disp: 30 tablet, Rfl: 2   cyclobenzaprine (FLEXERIL) 5 MG tablet, Take 1 tablet (5 mg total) by mouth 3 (three) times daily as needed for muscle spasms., Disp: 10 tablet, Rfl: 0   fluticasone (FLONASE) 50 MCG/ACT nasal spray,  SPRAY 1 SPRAY INTO BOTH NOSTRILS DAILY., Disp: 16 mL, Rfl: 2   folic acid (FOLVITE) 1 MG tablet, Take 1 mg by mouth daily., Disp: , Rfl:    lithium carbonate (ESKALITH) 450 MG CR tablet, Take 450 mg by mouth daily. , Disp: , Rfl:    methotrexate (RHEUMATREX) 2.5 MG tablet, Take by mouth., Disp: , Rfl:    ondansetron (ZOFRAN-ODT) 4 MG disintegrating tablet, Take 1 tablet (4 mg total) by mouth every 8 (eight) hours as needed for nausea or vomiting., Disp: 20 tablet,  Rfl: 0   risperiDONE (RISPERDAL) 2 MG tablet, Take 1 tablet (2 mg total) by mouth at bedtime., Disp: 14 tablet, Rfl: 0  Observations/Objective: Patient is well-developed, well-nourished in no acute distress.  Resting comfortably  at home.  Head is normocephalic, atraumatic.  No labored breathing.  Speech is clear and coherent with logical content.  Patient is alert and oriented at baseline.    Assessment and Plan:  1. Muscle cramp   2. Muscle strain  Due to some heavy lifting.  Thankfully has already calmed down substantially with OTC NSAIDs and a prior tablet of cyclobenzaprine.  No alarm signs or symptoms present.  Supportive measures and OTC medications reviewed.   Work note provided.  She thankfully is off the next few days.  Should be able to return to work on Monday without restrictions.  Strict urgent care/ER precautions reviewed with patient who voiced understanding and agreement with the plan.   Follow up with PCP  Follow Up Instructions: I discussed the assessment and treatment plan with the patient. The patient was provided an opportunity to ask questions and all were answered. The patient agreed with the plan and demonstrated an understanding of the instructions.  A copy of instructions were sent to the patient via MyChart unless otherwise noted below.     The patient was advised to call back or seek an in-person evaluation if the symptoms worsen or if the condition fails to improve as anticipated.  Time:  I spent 8 minutes with the patient via telehealth technology discussing the above problems/concerns.    Freddy Finner, NP

## 2021-12-30 NOTE — Patient Instructions (Signed)
  Dawn Horton, thank you for joining Dawn Finner, NP for today's virtual visit.  While this provider is not your primary care provider (PCP), if your PCP is located in our provider database this encounter information will be shared with them immediately following your visit.   A [facility] MyChart account gives you access to today's visit and all your visits, tests, and labs performed at [facility] " click here if you don't have a [facility] MyChart account or go to [email or web address]  Consent: (Patient) Dawn Horton provided verbal consent for this virtual visit at the beginning of the encounter.  Current Medications:  Current Outpatient Medications:    cetirizine (ZYRTEC) 10 MG tablet, Take 1 tablet (10 mg total) by mouth daily., Disp: 30 tablet, Rfl: 2   cyclobenzaprine (FLEXERIL) 5 MG tablet, Take 1 tablet (5 mg total) by mouth 3 (three) times daily as needed for muscle spasms., Disp: 10 tablet, Rfl: 0   fluticasone (FLONASE) 50 MCG/ACT nasal spray, SPRAY 1 SPRAY INTO BOTH NOSTRILS DAILY., Disp: 16 mL, Rfl: 2   folic acid (FOLVITE) 1 MG tablet, Take 1 mg by mouth daily., Disp: , Rfl:    lithium carbonate (ESKALITH) 450 MG CR tablet, Take 450 mg by mouth daily. , Disp: , Rfl:    methotrexate (RHEUMATREX) 2.5 MG tablet, Take by mouth., Disp: , Rfl:    ondansetron (ZOFRAN-ODT) 4 MG disintegrating tablet, Take 1 tablet (4 mg total) by mouth every 8 (eight) hours as needed for nausea or vomiting., Disp: 20 tablet, Rfl: 0   risperiDONE (RISPERDAL) 2 MG tablet, Take 1 tablet (2 mg total) by mouth at bedtime., Disp: 14 tablet, Rfl: 0   Medications ordered in this encounter:  No orders of the defined types were placed in this encounter.    *If you need refills on other medications prior to your next appointment, please contact your pharmacy*  Follow-Up: Call back or seek an in-person evaluation if the symptoms worsen or if the condition fails to improve as  anticipated.  [name] [phone]  Other Instructions  Follow up with PCP as planned   If you have been instructed to have an in-person evaluation today at a local Urgent Care facility, please use the link below. It will take you to a list of all of our available [facility], including address, phone number and hours of operation. Please do not delay care.  [facility]  If you or a family member do not have a primary care provider, use the link below to schedule a visit and establish care. When you choose a [facility] primary care physician or advanced practice provider, you gain a long-term partner in health. Find a Primary Care Provider  Learn more about [facility]'s in-office and virtual care options:  - Get Care Now

## 2022-01-16 ENCOUNTER — Telehealth: Payer: Medicaid Other | Admitting: Nurse Practitioner

## 2022-01-16 DIAGNOSIS — M545 Low back pain, unspecified: Secondary | ICD-10-CM | POA: Diagnosis not present

## 2022-01-16 MED ORDER — CYCLOBENZAPRINE HCL 5 MG PO TABS: 5.0000 mg | ORAL_TABLET | Freq: Three times a day (TID) | ORAL | 0 refills | Status: DC | PRN

## 2022-01-16 NOTE — Patient Instructions (Signed)
  Dawn Horton, thank you for joining Claiborne Rigg, NP for today's virtual visit.  While this provider is not your primary care provider (PCP), if your PCP is located in our provider database this encounter information will be shared with them immediately following your visit.   A [facility] MyChart account gives you access to today's visit and all your visits, tests, and labs performed at [facility] " click here if you don't have a [facility] MyChart account or go to [email or web address]  Consent: (Patient) Dawn Horton provided verbal consent for this virtual visit at the beginning of the encounter.  Current Medications:  Current Outpatient Medications:    cetirizine (ZYRTEC) 10 MG tablet, Take 1 tablet (10 mg total) by mouth daily., Disp: 30 tablet, Rfl: 2   cyclobenzaprine (FLEXERIL) 5 MG tablet, Take 1 tablet (5 mg total) by mouth 3 (three) times daily as needed for muscle spasms., Disp: 60 tablet, Rfl: 0   fluticasone (FLONASE) 50 MCG/ACT nasal spray, SPRAY 1 SPRAY INTO BOTH NOSTRILS DAILY., Disp: 16 mL, Rfl: 2   folic acid (FOLVITE) 1 MG tablet, Take 1 mg by mouth daily., Disp: , Rfl:    lithium carbonate (ESKALITH) 450 MG CR tablet, Take 450 mg by mouth daily. , Disp: , Rfl:    methotrexate (RHEUMATREX) 2.5 MG tablet, Take by mouth., Disp: , Rfl:    ondansetron (ZOFRAN-ODT) 4 MG disintegrating tablet, Take 1 tablet (4 mg total) by mouth every 8 (eight) hours as needed for nausea or vomiting., Disp: 20 tablet, Rfl: 0   risperiDONE (RISPERDAL) 2 MG tablet, Take 1 tablet (2 mg total) by mouth at bedtime., Disp: 14 tablet, Rfl: 0   Medications ordered in this encounter:  Meds ordered this encounter  Medications   cyclobenzaprine (FLEXERIL) 5 MG tablet    Sig: Take 1 tablet (5 mg total) by mouth 3 (three) times daily as needed for muscle spasms.    Dispense:  60 tablet    Refill:  0    Order Specific Question:   Supervising Provider    Answer:   Merrilee Jansky  [id number]     *If you need refills on other medications prior to your next appointment, please contact your pharmacy*  Follow-Up: Call back or seek an in-person evaluation if the symptoms worsen or if the condition fails to improve as anticipated.  [facility] [phone]  Other Instructions May alternate with heat and ice application for pain relief. May also alternate with acetaminophen and Ibuprofen as prescribed for back pain. Other alternatives include massage, acupuncture and water aerobics.  You must stay active and avoid a sedentary lifestyle.     If you have been instructed to have an in-person evaluation today at a local Urgent Care facility, please use the link below. It will take you to a list of all of our available [facility], including address, phone number and hours of operation. Please do not delay care.  [facility]  If you or a family member do not have a primary care provider, use the link below to schedule a visit and establish care. When you choose a [facility] [place] primary care physician or advanced practice provider, you gain a long-term partner in health. Find a Primary Care Provider  Learn more about [place]'s in-office and virtual care options: [facility] - Get Care Now

## 2022-01-16 NOTE — Progress Notes (Signed)
Virtual Visit Consent   Dawn Horton, you are scheduled for a virtual visit with a [facility] provider today. Just as with appointments in the office, your consent must be obtained to participate. Your consent will be active for this visit and any virtual visit you may have with one of our providers in the next 365 days. If you have a MyChart account, a copy of this consent can be sent to you electronically.  As this is a virtual visit, video technology does not allow for your provider to perform a traditional examination. This may limit your provider's ability to fully assess your condition. If your provider identifies any concerns that need to be evaluated in person or the need to arrange testing (such as labs, EKG, etc.), we will make arrangements to do so. Although advances in technology are sophisticated, we cannot ensure that it will always work on either your end or our end. If the connection with a video visit is poor, the visit may have to be switched to a telephone visit. With either a video or telephone visit, we are not always able to ensure that we have a secure connection.  By engaging in this virtual visit, you consent to the provision of healthcare and authorize for your insurance to be billed (if applicable) for the services provided during this visit. Depending on your insurance coverage, you may receive a charge related to this service.  I need to obtain your verbal consent now. Are you willing to proceed with your visit today? Eniya Cannady has provided verbal consent on 01/16/2022 for a virtual visit (video or telephone). Claiborne Rigg, NP  Date: 01/16/2022 6:06 PM  Virtual Visit via Video Note   I, Claiborne Rigg, connected with  Brayli Klingbeil  ([id number], 21-Jan-1986) on 01/16/22 at  6:00 PM EST by a video-enabled telemedicine application and verified that I am speaking with the correct person using two identifiers.  Location: Patient: Virtual Visit Location Patient:  Home Provider: Virtual Visit Location Provider: Home Office   I discussed the limitations of evaluation and management by telemedicine and the availability of in person appointments. The patient expressed understanding and agreed to proceed.    History of Present Illness: Dawn Horton is a 36 y.o. who identifies as a female who was assigned female at birth, and is being seen today for low back pain.  Back Pain: Patient presents for presents evaluation of low back problems.  Symptoms have been present for a few days and include  pain and stiffness in the lower back . Initial inciting event: none. . Alleviating factors identifiable by patient are  rest and avoidance of acvtivities . Exacerbating factors identifiable by patient are bending backwards, bending forwards, bending sideways, walking uphill, and heavy lifting . Treatments so far initiated by patient:  OTC analgesics     Problems:  Patient Active Problem List   Diagnosis Date Noted   Bipolar affective disorder, current episode manic with psychotic symptoms (HCC) 05/21/2016    Allergies:  Allergies  Allergen Reactions   Abilify [Aripiprazole] Other (See Comments)    Makes pt delirious and forget things.    Ibuprofen Nausea Only   Penicillins Hives    amoxicillin   Medications:  Current Outpatient Medications:    cetirizine (ZYRTEC) 10 MG tablet, Take 1 tablet (10 mg total) by mouth daily., Disp: 30 tablet, Rfl: 2   cyclobenzaprine (FLEXERIL) 5 MG tablet, Take 1 tablet (5 mg total) by mouth 3 (three) times daily as  needed for muscle spasms., Disp: 60 tablet, Rfl: 0   fluticasone (FLONASE) 50 MCG/ACT nasal spray, SPRAY 1 SPRAY INTO BOTH NOSTRILS DAILY., Disp: 16 mL, Rfl: 2   folic acid (FOLVITE) 1 MG tablet, Take 1 mg by mouth daily., Disp: , Rfl:    lithium carbonate (ESKALITH) 450 MG CR tablet, Take 450 mg by mouth daily. , Disp: , Rfl:    methotrexate (RHEUMATREX) 2.5 MG tablet, Take by mouth., Disp: , Rfl:    ondansetron  (ZOFRAN-ODT) 4 MG disintegrating tablet, Take 1 tablet (4 mg total) by mouth every 8 (eight) hours as needed for nausea or vomiting., Disp: 20 tablet, Rfl: 0   risperiDONE (RISPERDAL) 2 MG tablet, Take 1 tablet (2 mg total) by mouth at bedtime., Disp: 14 tablet, Rfl: 0  Observations/Objective: Patient is well-developed, well-nourished in no acute distress.  Resting comfortably at home.  Head is normocephalic, atraumatic.  No labored breathing.  Speech is clear and coherent with logical content.  Patient is alert and oriented at baseline.     Assessment and Plan: 1. Acute bilateral low back pain without sciatica - cyclobenzaprine (FLEXERIL) 5 MG tablet; Take 1 tablet (5 mg total) by mouth 3 (three) times daily as needed for muscle spasms.  Dispense: 60 tablet; Refill: 0 May alternate with heat and ice application for pain relief. May also alternate with acetaminophen and Ibuprofen as prescribed for back pain. Other alternatives include massage, acupuncture and water aerobics.  You must stay active and avoid a sedentary lifestyle.    Follow Up Instructions: I discussed the assessment and treatment plan with the patient. The patient was provided an opportunity to ask questions and all were answered. The patient agreed with the plan and demonstrated an understanding of the instructions.  A copy of instructions were sent to the patient via MyChart unless otherwise noted below.   The patient was advised to call back or seek an in-person evaluation if the symptoms worsen or if the condition fails to improve as anticipated.  Time:  I spent 11 minutes with the patient via telehealth technology discussing the above problems/concerns.    Claiborne Rigg, NP

## 2022-01-21 ENCOUNTER — Telehealth: Payer: Medicaid Other | Admitting: Family

## 2022-01-21 DIAGNOSIS — M546 Pain in thoracic spine: Secondary | ICD-10-CM | POA: Diagnosis not present

## 2022-01-21 DIAGNOSIS — M25552 Pain in left hip: Secondary | ICD-10-CM

## 2022-01-21 DIAGNOSIS — M25512 Pain in left shoulder: Secondary | ICD-10-CM | POA: Diagnosis not present

## 2022-01-21 MED ORDER — NAPROXEN 500 MG PO TABS: 500.0000 mg | ORAL_TABLET | Freq: Two times a day (BID) | ORAL | 0 refills | Status: DC

## 2022-01-21 NOTE — Progress Notes (Signed)
Virtual Visit Consent   Dawn Horton, you are scheduled for a virtual visit with a [facility] provider today. Just as with appointments in the office, your consent must be obtained to participate. Your consent will be active for this visit and any virtual visit you may have with one of our providers in the next 365 days. If you have a MyChart account, a copy of this consent can be sent to you electronically.  As this is a virtual visit, video technology does not allow for your provider to perform a traditional examination. This may limit your provider's ability to fully assess your condition. If your provider identifies any concerns that need to be evaluated in person or the need to arrange testing (such as labs, EKG, etc.), we will make arrangements to do so. Although advances in technology are sophisticated, we cannot ensure that it will always work on either your end or our end. If the connection with a video visit is poor, the visit may have to be switched to a telephone visit. With either a video or telephone visit, we are not always able to ensure that we have a secure connection.  By engaging in this virtual visit, you consent to the provision of healthcare and authorize for your insurance to be billed (if applicable) for the services provided during this visit. Depending on your insurance coverage, you may receive a charge related to this service.  I need to obtain your verbal consent now. Are you willing to proceed with your visit today? [name] has provided verbal consent on 01/21/2022 for a virtual visit (video or telephone). [name], FNP  Date: 01/21/2022 6:06 PM  Virtual Visit via Video Note   I, [name], connected with  Dawn Horton  ([id number], December 12, 1985) on 01/21/22 at  6:00 PM EST by a video-enabled telemedicine application and verified that I am speaking with the correct person using two identifiers.  Location: Patient: Virtual Visit Location Patient: Home Provider:  Virtual Visit Location Provider: Home Office   I discussed the limitations of evaluation and management by telemedicine and the availability of in person appointments. The patient expressed understanding and agreed to proceed.    History of Present Illness: Dawn Horton is a 36 y.o. who identifies as a female who was assigned female at birth, and is being seen today for left shoulder, left hip, and back pain that started yesterday. She reports she was not able to go to work yesterday. She has been able to go out to get NSAID that has greatly helped.   HPI: Hip Pain  The incident occurred 12 to 24 hours ago. There was no injury mechanism. The pain is at a severity of 2/10. The pain is mild.  Shoulder Pain  This is a chronic problem. The current episode started more than 1 year ago. The pain is at a severity of 3/10.  Back Pain This is a new problem. The problem has been waxing and waning since onset. The pain is present in the thoracic spine. The quality of the pain is described as aching. The pain is mild. She has tried muscle relaxant and NSAIDs for the symptoms. The treatment provided mild relief.    Problems:  Patient Active Problem List   Diagnosis Date Noted   Bipolar affective disorder, current episode manic with psychotic symptoms (HCC) 05/21/2016    Allergies:  Allergies  Allergen Reactions   Abilify [Aripiprazole] Other (See Comments)    Makes pt delirious and forget things.  Ibuprofen Nausea Only   Penicillins Hives    amoxicillin   Medications:  Current Outpatient Medications:    naproxen (NAPROSYN) 500 MG tablet, Take 1 tablet (500 mg total) by mouth 2 (two) times daily with a meal., Disp: 30 tablet, Rfl: 0   cetirizine (ZYRTEC) 10 MG tablet, Take 1 tablet (10 mg total) by mouth daily., Disp: 30 tablet, Rfl: 2   cyclobenzaprine (FLEXERIL) 5 MG tablet, Take 1 tablet (5 mg total) by mouth 3 (three) times daily as needed for muscle spasms., Disp: 60 tablet, Rfl: 0    fluticasone (FLONASE) 50 MCG/ACT nasal spray, SPRAY 1 SPRAY INTO BOTH NOSTRILS DAILY., Disp: 16 mL, Rfl: 2   folic acid (FOLVITE) 1 MG tablet, Take 1 mg by mouth daily., Disp: , Rfl:    lithium carbonate (ESKALITH) 450 MG CR tablet, Take 450 mg by mouth daily. , Disp: , Rfl:    methotrexate (RHEUMATREX) 2.5 MG tablet, Take by mouth., Disp: , Rfl:    ondansetron (ZOFRAN-ODT) 4 MG disintegrating tablet, Take 1 tablet (4 mg total) by mouth every 8 (eight) hours as needed for nausea or vomiting., Disp: 20 tablet, Rfl: 0   risperiDONE (RISPERDAL) 2 MG tablet, Take 1 tablet (2 mg total) by mouth at bedtime., Disp: 14 tablet, Rfl: 0  Observations/Objective: Patient is well-developed, well-nourished in no acute distress.  Resting comfortably  at home.  Head is normocephalic, atraumatic.  No labored breathing.  Speech is clear and coherent with logical content.  Patient is alert and oriented at baseline.    Assessment and Plan: 1. Acute pain of left shoulder - naproxen (NAPROSYN) 500 MG tablet; Take 1 tablet (500 mg total) by mouth 2 (two) times daily with a meal.  Dispense: 30 tablet; Refill: 0  2. Left hip pain - naproxen (NAPROSYN) 500 MG tablet; Take 1 tablet (500 mg total) by mouth 2 (two) times daily with a meal.  Dispense: 30 tablet; Refill: 0  3. Acute bilateral thoracic back pain  Discussed in length we could not write a work note today because she has had 10 work notes since July  No other NSAID's while taking naproxen  Rest ROM exercises Encouraged her to follow up with PCP to discuss FMLA  Follow Up Instructions: I discussed the assessment and treatment plan with the patient. The patient was provided an opportunity to ask questions and all were answered. The patient agreed with the plan and demonstrated an understanding of the instructions.  A copy of instructions were sent to the patient via MyChart unless otherwise noted below.     The patient was advised to call back or seek  an in-person evaluation if the symptoms worsen or if the condition fails to improve as anticipated.  Time:  I spent 12 minutes with the patient via telehealth technology discussing the above problems/concerns.    [name], FNP

## 2022-01-21 NOTE — Patient Instructions (Signed)
Joint Pain Joint pain may be caused by many things. Joint pain is likely to go away when you follow instructions from your health care provider for relieving pain at home. However, joint pain can also be caused by conditions that require more treatment. Common causes of joint pain include: Bruising in the area of the joint. Injury caused by repeating certain movements too many times. Age-related joint wear and tear. Buildup of uric acid crystals in the joint (gout). Inflammation of the joint. Other forms of arthritis. Infections of the joint or of the bone. Your health care provider may recommend that you take pain medicine or wear a supportive device like an elastic bandage, sling, or splint. If your joint pain continues, you may need lab or imaging tests to diagnose the cause of your joint pain. Follow these instructions at home: Managing pain, stiffness, and swelling     If directed, put ice on the painful area. To do this: If you have a removable elastic bandage, sling, or splint, take it off as told by your doctor. Put ice in a plastic bag. Place a towel between your skin and the bag. Leave the ice on for 20 minutes, 2-3 times a day. Remove the ice if your skin turns bright red. This is very important. If you cannot feel pain, heat, or cold, you have a greater risk of damage to the area. Move your fingers and toes often to reduce stiffness and swelling. Raise the injured area above the level of your heart while you are sitting or lying down. If directed, apply heat to the painful area as often as told by your health care provider. Use the heat source that your health care provider recommends, such as a moist heat pack or a heating pad. Place a towel between your skin and the heat source. Leave the heat on for 20-30 minutes. Remove the heat if your skin turns bright red. This is especially important if you are unable to feel pain, heat, or cold. You have a greater risk of getting  burned.  Activity Rest as told by your health care provider. Do not do anything that causes or worsens pain. Begin exercising or stretching the affected area as told by your health care provider. Return to your normal activities as told by your health care provider. Ask your health care provider what activities are safe for you. If you have an elastic bandage, sling, or splint: Wear the bandage, sling, or splint as told by your health care provider. Remove it only as told by your health care provider. Loosen it if your fingers or toes below the joint tingle, become numb, or turn cold and blue. Keep it clean. Ask your health care provider if you should remove it before bathing. If the bandage, sling, or splint is not waterproof: Do not let it get wet. Cover it with a watertight covering when you take a bath or shower. General instructions Treatment may include medicines for pain and inflammation that are taken by mouth or applied to the skin. Take over-the-counter and prescription medicines only as told by your health care provider. Do not use any products that contain nicotine or tobacco, such as cigarettes, e-cigarettes, and chewing tobacco. If you need help quitting, ask your health care provider. Keep all follow-up visits. This is important. Contact a health care provider if: You have pain that gets worse and does not get better with medicine. Your joint pain does not improve within 3 days. You have increased   bruising or swelling. You have a fever. You lose 10 lb (4.5 kg) or more without trying. Get help right away if: You cannot move the joint. Your fingers or toes tingle, become numb, or turn cold and blue. You have a fever along with a joint that is red, warm, and swollen. Summary Joint pain may be caused by many things. Your health care provider may recommend that you take pain medicine or wear a supportive device such as an elastic bandage, sling, or splint. If your joint pain  continues, you may need tests to diagnose the cause of your joint pain. Take over-the-counter and prescription medicines only as told by your health care provider. This information is not intended to replace advice given to you by your health care provider. Make sure you discuss any questions you have with your health care provider. Document Revised: 05/18/2019 Document Reviewed: 05/18/2019 Elsevier Patient Education  2023 Elsevier Inc.  

## 2022-03-31 ENCOUNTER — Other Ambulatory Visit: Payer: Self-pay | Admitting: Nurse Practitioner

## 2022-03-31 DIAGNOSIS — H1013 Acute atopic conjunctivitis, bilateral: Secondary | ICD-10-CM

## 2022-04-03 ENCOUNTER — Ambulatory Visit
Admission: RE | Admit: 2022-04-03 | Discharge: 2022-04-03 | Disposition: A | Discharge status: Home / Self Care | Payer: Medicaid Other | Source: Ambulatory Visit | Attending: Physician Assistant | Admitting: Physician Assistant

## 2022-04-03 VITALS — BP 109/70 | HR 66 | Temp 98.0°F | Resp 18

## 2022-04-03 DIAGNOSIS — J0100 Acute maxillary sinusitis, unspecified: Secondary | ICD-10-CM

## 2022-04-03 DIAGNOSIS — J069 Acute upper respiratory infection, unspecified: Secondary | ICD-10-CM | POA: Diagnosis not present

## 2022-04-03 MED ORDER — PREDNISONE 10 MG PO TABS: 10.0000 mg | ORAL_TABLET | Freq: Three times a day (TID) | ORAL | 0 refills | Status: DC

## 2022-04-03 MED ORDER — PSEUDOEPHEDRINE HCL 30 MG PO TABS: 30.0000 mg | ORAL_TABLET | Freq: Two times a day (BID) | ORAL | 0 refills | Status: DC

## 2022-04-03 MED ORDER — FLUTICASONE PROPIONATE 50 MCG/ACT NA SUSP: 2.0000 | Freq: Every day | NASAL | 2 refills | Status: AC

## 2022-04-03 NOTE — ED Triage Notes (Signed)
Pt present nasal congestion with sore throat and headache, symptoms started last week.

## 2022-04-03 NOTE — ED Provider Notes (Signed)
[facility]    CSN: [id number] Arrival date & time: 04/03/22  1532      History   Chief Complaint Chief Complaint  Patient presents with   Nasal Congestion    I think have a sinus infection and I'm very tired think my iron is very low I'm anemic but don't know what dosage of iron supplements I'm suppose to take. - Entered by patient   Sore Throat    HPI Dawn Horton is a 37 y.o. female.   37 year old female presents with sinus pressure and discomfort.  Patient indicates a week she has been having upper respiratory congestion, rhinitis which is mainly been clear, postnasal drip, intermittent scratchy throat, bilateral ear congestion.  Patient indicates symptoms have been persistent and progressive over the past several days.  Patient indicates she feels like there is a lot of pressure behind the eyes, feels like her face is swollen.  She has been taking some OTC medication without relief of her symptoms.  She indicates she has not had cough, wheezing, or shortness of breath.  She is tolerating fluids well.   Sore Throat    Past Medical History:  Diagnosis Date   Anemia    Bipolar 1 disorder (HCC)    Depression    GERD (gastroesophageal reflux disease)    Tendinitis     Patient Active Problem List   Diagnosis Date Noted   Bipolar affective disorder, current episode manic with psychotic symptoms ([place]) 05/21/2016    Past Surgical History:  Procedure Laterality Date   CESAREAN SECTION      OB History     Gravida  3   Para  2   Term  2   Preterm  0   AB  1   Living  2      SAB  1   IAB  0   Ectopic  0   Multiple  0   Live Births  2            Home Medications    Prior to Admission medications   Medication Sig Start Date End Date Taking? Authorizing Provider  predniSONE (DELTASONE) 10 MG tablet Take 1 tablet (10 mg total) by mouth in the morning, at noon, and at bedtime. 04/03/22  Yes Nyoka Lint, PA-C  pseudoephedrine (SUDAFED)  30 MG tablet Take 1 tablet (30 mg total) by mouth 2 (two) times daily. 04/03/22  Yes Nyoka Lint, PA-C  cetirizine (ZYRTEC) 10 MG tablet Take 1 tablet (10 mg total) by mouth daily. 12/10/21   Hassell Done, Mary-Margaret, FNP  cyclobenzaprine (FLEXERIL) 5 MG tablet Take 1 tablet (5 mg total) by mouth 3 (three) times daily as needed for muscle spasms. 01/16/22   Gildardo Pounds, NP  fluticasone (FLONASE) 50 MCG/ACT nasal spray Place 2 sprays into both nostrils daily. 04/03/22   Nyoka Lint, PA-C  folic acid (FOLVITE) 1 MG tablet Take 1 mg by mouth daily. 09/12/20   [provider]  lithium carbonate (ESKALITH) 450 MG CR tablet Take 450 mg by mouth daily.     [provider]  methotrexate (RHEUMATREX) 2.5 MG tablet Take by mouth. 10/17/20   [provider]  naproxen (NAPROSYN) 500 MG tablet Take 1 tablet (500 mg total) by mouth 2 (two) times daily with a meal. 01/21/22   Hawks, Alyse Low A, FNP  ondansetron (ZOFRAN-ODT) 4 MG disintegrating tablet Take 1 tablet (4 mg total) by mouth every 8 (eight) hours as needed for nausea or vomiting. 07/01/21  Brunetta Jeans, PA-C  risperiDONE (RISPERDAL) 2 MG tablet Take 1 tablet (2 mg total) by mouth at bedtime. 05/20/16   Montine Circle, PA-C  traZODone (DESYREL) 25 mg TABS tablet Take 50 mg by mouth at bedtime.  11/08/19  [provider]    Family History Family History  Problem Relation Age of Onset   Healthy Mother    Healthy Father     Social History Social History   Tobacco Use   Smoking status: Every Day    Packs/day: 1.00    Years: 15.00    Total pack years: 15.00    Types: Cigarettes   Smokeless tobacco: Never  Substance Use Topics   Alcohol use: Not Currently    Comment: Occ   Drug use: Not Currently    Types: Marijuana    Comment: no longer using     Allergies   Abilify [aripiprazole], Ibuprofen, and Penicillins   Review of Systems Review of Systems  HENT:  Positive for postnasal drip, rhinorrhea,  sinus pressure and sinus pain.      Physical Exam Triage Vital Signs ED Triage Vitals  Enc Vitals Group     BP 04/03/22 1547 109/70     Pulse Rate 04/03/22 1547 66     Resp 04/03/22 1547 18     Temp 04/03/22 1547 98 F (36.7 C)     Temp Source 04/03/22 1547 Oral     SpO2 04/03/22 1547 98 %     Weight --      Height --      Head Circumference --      Peak Flow --      Pain Score 04/03/22 1546 8     Pain Loc --      Pain Edu? --      Excl. in [place]? --    No data found.  Updated Vital Signs BP 109/70 (BP Location: Left Arm)   Pulse 66   Temp 98 F (36.7 C) (Oral)   Resp 18   SpO2 98%   Visual Acuity Right Eye Distance:   Left Eye Distance:   Bilateral Distance:    Right Eye Near:   Left Eye Near:    Bilateral Near:     Physical Exam Constitutional:      Appearance: She is well-developed.  HENT:     Right Ear: Ear canal normal. Tympanic membrane is injected.     Left Ear: Ear canal normal. Tympanic membrane is injected.     Mouth/Throat:     Mouth: Mucous membranes are moist.     Pharynx: Oropharynx is clear.     Comments: Facial: Pain is palpated bilateral maxillary which reveals tenderness present Cardiovascular:     Rate and Rhythm: Normal rate and regular rhythm.     Heart sounds: Normal heart sounds.  Pulmonary:     Effort: Pulmonary effort is normal.     Breath sounds: Normal breath sounds and air entry. No wheezing, rhonchi or rales.  Lymphadenopathy:     Cervical: No cervical adenopathy.  Neurological:     Mental Status: She is alert.      UC Treatments / Results  Labs (all labs ordered are listed, but only abnormal results are displayed) Labs Reviewed - No data to display  EKG   Radiology No results found.  Procedures Procedures (including critical care time)  Medications Ordered in UC Medications - No data to display  Initial Impression / Assessment and Plan / UC Course  I have reviewed the triage vital signs and the nursing  notes.  Pertinent labs & imaging results that were available during my care of the patient were reviewed by me and considered in my medical decision making (see chart for details).    Plan: The diagnosis will be treated with the following: 1.  Maxillary sinusitis: A.  Flonase nasal spray, 2 sprays each nostril once daily to help decrease sinus congestion. B.  Sudafed 30 mg every 12 hours to decrease sinus congestion. C.  Prednisone 10 mg every 8 hours for 5 days only to help decrease acute sinus pressure, swelling and discomfort. 2.  Upper respiratory tract infection: A.  Flonase nasal spray, 2 sprays each nostril once daily to help decrease sinus congestion. B.  Advised take Tylenol or ibuprofen as needed for pain relief. 3.  Advised follow-up PCP or return to urgent care as needed. Final Clinical Impressions(s) / UC Diagnoses   Final diagnoses:  Acute upper respiratory infection  Acute non-recurrent maxillary sinusitis     Discharge Instructions      Advised to use the Flonase nasal spray, 2 sprays each nostril once daily to help decrease sinus congestion. Advised to take the prednisone 10 mg 3 times a day for 5 days only to help reduce the acute sinus inflammation, pressure and discomfort. Advised take the Sudafed every 12 hours to help decrease sinus congestion.  Advised follow-up PCP or return to urgent care if symptoms fail to improve.    ED Prescriptions     Medication Sig Dispense Auth. Provider   fluticasone (FLONASE) 50 MCG/ACT nasal spray Place 2 sprays into both nostrils daily. 16 mL Nyoka Lint, PA-C   pseudoephedrine (SUDAFED) 30 MG tablet Take 1 tablet (30 mg total) by mouth 2 (two) times daily. 20 tablet Nyoka Lint, PA-C   predniSONE (DELTASONE) 10 MG tablet Take 1 tablet (10 mg total) by mouth in the morning, at noon, and at bedtime. 15 tablet Nyoka Lint, PA-C      PDMP not reviewed this encounter.   Nyoka Lint, PA-C 04/03/22 1603

## 2022-04-03 NOTE — Discharge Instructions (Signed)
Advised to use the Flonase nasal spray, 2 sprays each nostril once daily to help decrease sinus congestion. Advised to take the prednisone 10 mg 3 times a day for 5 days only to help reduce the acute sinus inflammation, pressure and discomfort. Advised take the Sudafed every 12 hours to help decrease sinus congestion.  Advised follow-up PCP or return to urgent care if symptoms fail to improve.

## 2022-04-17 IMAGING — DX DG CHEST 2V
2 series · 2 of 2 positions shown · non-contrast
Comparison: 03/31/2016

CLINICAL DATA: Chest pain for 1 week.

EXAM:
CHEST - 2 VIEW

[dg chest 2 view (1 of 2)]
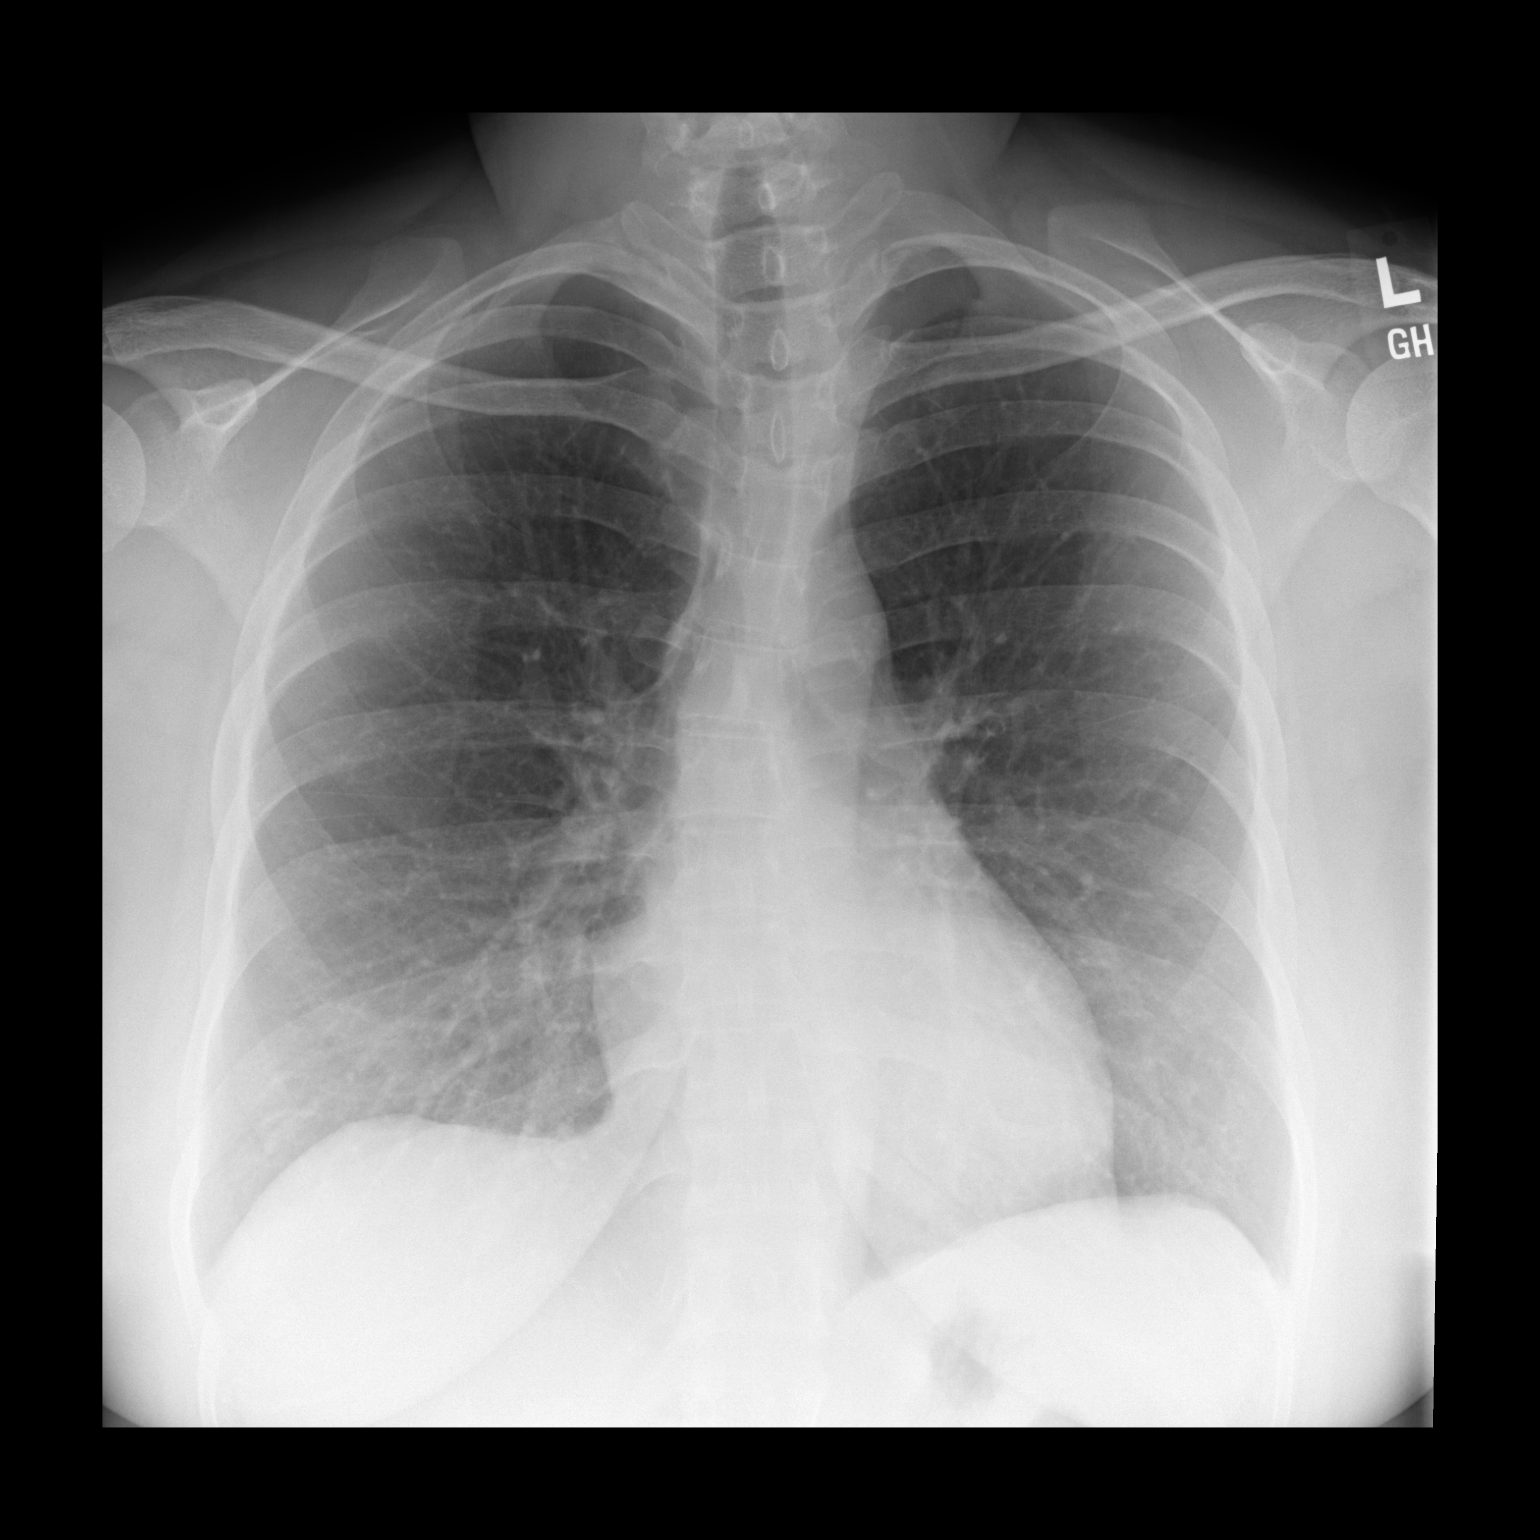

[dg chest 2 view (2 of 2)]
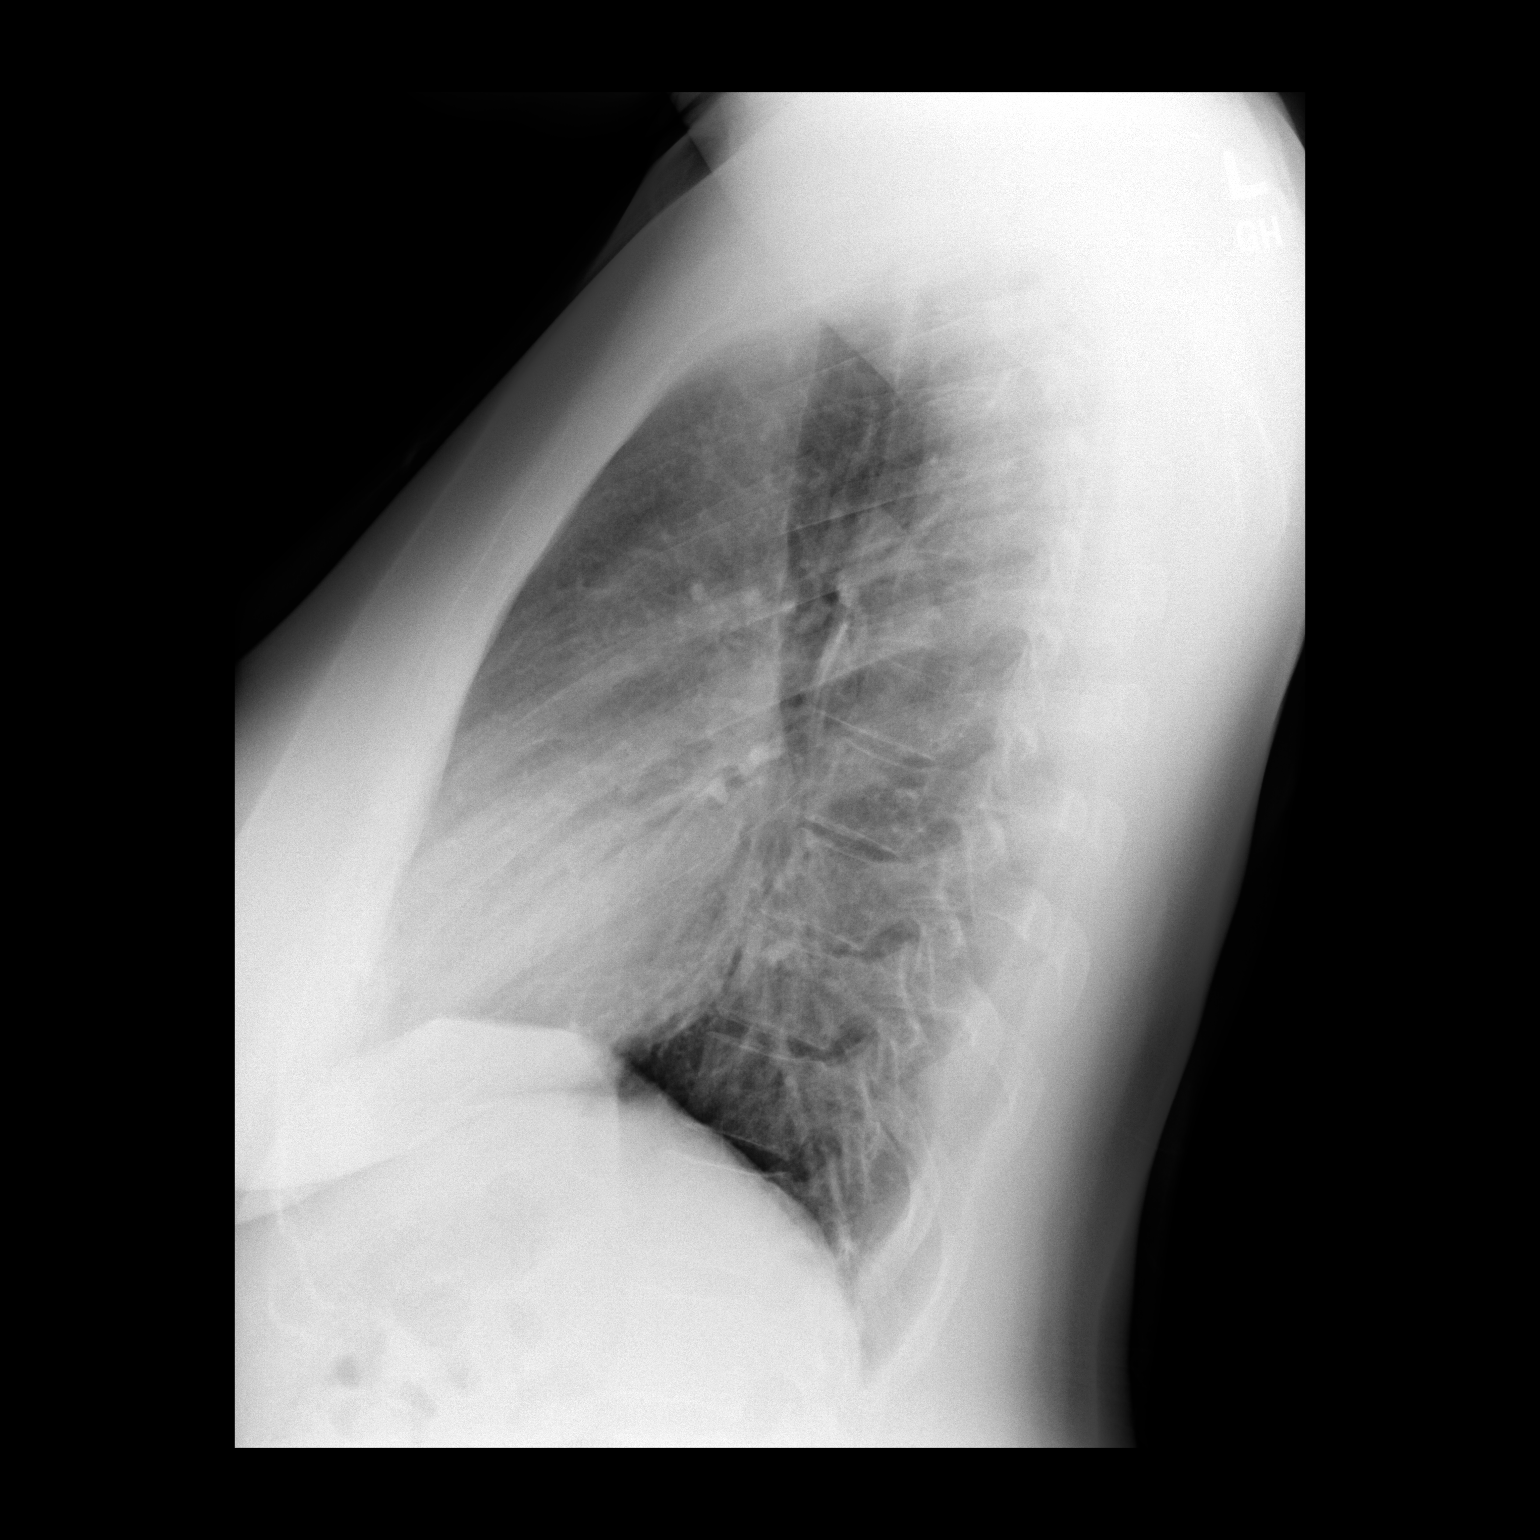

[2 of 2 positions shown; findings below may reference images not displayed]

FINDINGS: The cardiac silhouette, mediastinal and hilar contours are normal.
The lungs are clear. No pleural effusions. No pulmonary lesions. The
bony thorax is intact.
IMPRESSION: No acute cardiopulmonary findings.

## 2022-05-19 ENCOUNTER — Other Ambulatory Visit: Payer: Self-pay | Admitting: Nurse Practitioner

## 2022-05-19 DIAGNOSIS — H1013 Acute atopic conjunctivitis, bilateral: Secondary | ICD-10-CM

## 2022-06-11 ENCOUNTER — Other Ambulatory Visit: Payer: Self-pay | Admitting: Nurse Practitioner

## 2022-06-11 DIAGNOSIS — H1013 Acute atopic conjunctivitis, bilateral: Secondary | ICD-10-CM

## 2022-06-13 ENCOUNTER — Other Ambulatory Visit: Payer: Self-pay | Admitting: Family

## 2022-06-13 ENCOUNTER — Other Ambulatory Visit: Payer: Self-pay | Admitting: Nurse Practitioner

## 2022-06-13 DIAGNOSIS — M25512 Pain in left shoulder: Secondary | ICD-10-CM

## 2022-06-13 DIAGNOSIS — M25552 Pain in left hip: Secondary | ICD-10-CM

## 2022-06-13 DIAGNOSIS — H1013 Acute atopic conjunctivitis, bilateral: Secondary | ICD-10-CM

## 2022-07-09 ENCOUNTER — Other Ambulatory Visit: Payer: Self-pay | Admitting: Nurse Practitioner

## 2022-07-09 DIAGNOSIS — H1013 Acute atopic conjunctivitis, bilateral: Secondary | ICD-10-CM

## 2022-09-17 ENCOUNTER — Other Ambulatory Visit: Payer: Self-pay | Admitting: Nurse Practitioner

## 2022-09-17 DIAGNOSIS — H1013 Acute atopic conjunctivitis, bilateral: Secondary | ICD-10-CM

## 2022-10-31 ENCOUNTER — Telehealth: Payer: MEDICAID | Admitting: Physician Assistant

## 2022-10-31 DIAGNOSIS — M25561 Pain in right knee: Secondary | ICD-10-CM

## 2022-10-31 DIAGNOSIS — M797 Fibromyalgia: Secondary | ICD-10-CM | POA: Diagnosis not present

## 2022-10-31 DIAGNOSIS — M25562 Pain in left knee: Secondary | ICD-10-CM

## 2022-10-31 DIAGNOSIS — G8929 Other chronic pain: Secondary | ICD-10-CM

## 2022-10-31 MED ORDER — NAPROXEN 500 MG PO TABS
500.0000 mg | ORAL_TABLET | Freq: Two times a day (BID) | ORAL | 0 refills | Status: DC
Start: 2022-10-31 — End: 2023-01-27

## 2022-10-31 NOTE — Progress Notes (Signed)
Virtual Visit Consent   Dawn Horton, you are scheduled for a virtual visit with a [facility] provider today. Just as with appointments in the office, your consent must be obtained to participate. Your consent will be active for this visit and any virtual visit you may have with one of our providers in the next 365 days. If you have a MyChart account, a copy of this consent can be sent to you electronically.  As this is a virtual visit, video technology does not allow for your provider to perform a traditional examination. This may limit your provider's ability to fully assess your condition. If your provider identifies any concerns that need to be evaluated in person or the need to arrange testing (such as labs, EKG, etc.), we will make arrangements to do so. Although advances in technology are sophisticated, we cannot ensure that it will always work on either your end or our end. If the connection with a video visit is poor, the visit may have to be switched to a telephone visit. With either a video or telephone visit, we are not always able to ensure that we have a secure connection.  By engaging in this virtual visit, you consent to the provision of healthcare and authorize for your insurance to be billed (if applicable) for the services provided during this visit. Depending on your insurance coverage, you may receive a charge related to this service.  I need to obtain your verbal consent now. Are you willing to proceed with your visit today? Dawn Horton has provided verbal consent on 10/31/2022 for a virtual visit (video or telephone). Dawn Loveless, PA-C  Date: 10/31/2022 7:34 PM  Virtual Visit via Video Note   I, Dawn Horton, connected with  Dawn Horton  ([id number], [birth year]) on 10/31/22 at  7:30 PM EDT by a video-enabled telemedicine application and verified that I am speaking with the correct person using two identifiers.  Location: Patient: Virtual Visit Location Patient:  Home Provider: Virtual Visit Location Provider: Home Office   I discussed the limitations of evaluation and management by telemedicine and the availability of in person appointments. The patient expressed understanding and agreed to proceed.    History of Present Illness: Dawn Horton is a 37 y.o. who identifies as a female who was assigned female at birth, and is being seen today for fibromyalgia flare with swelling of the knees bilaterally.  Flare started yesterday. Has tried Pregabalin (Lyrica) with tylenol without relief. Requesting a work note.   Problems:  Patient Active Problem List   Diagnosis Date Noted   Bipolar affective disorder, current episode manic with psychotic symptoms (HCC) 05/21/2016    Allergies:  Allergies  Allergen Reactions   Abilify [Aripiprazole] Other (See Comments)    Makes pt delirious and forget things.    Ibuprofen Nausea Only   Penicillins Hives    amoxicillin   Medications:  Current Outpatient Medications:    naproxen (NAPROSYN) 500 MG tablet, Take 1 tablet (500 mg total) by mouth 2 (two) times daily with a meal., Disp: 30 tablet, Rfl: 0   cetirizine (ZYRTEC) 10 MG tablet, Take 1 tablet (10 mg total) by mouth daily., Disp: 30 tablet, Rfl: 2   cyclobenzaprine (FLEXERIL) 5 MG tablet, Take 1 tablet (5 mg total) by mouth 3 (three) times daily as needed for muscle spasms., Disp: 60 tablet, Rfl: 0   fluticasone (FLONASE) 50 MCG/ACT nasal spray, Place 2 sprays into both nostrils daily., Disp: 16 mL, Rfl: 2  folic acid (FOLVITE) 1 MG tablet, Take 1 mg by mouth daily., Disp: , Rfl:    lithium carbonate (ESKALITH) 450 MG CR tablet, Take 450 mg by mouth daily. , Disp: , Rfl:    methotrexate (RHEUMATREX) 2.5 MG tablet, Take by mouth., Disp: , Rfl:    ondansetron (ZOFRAN-ODT) 4 MG disintegrating tablet, Take 1 tablet (4 mg total) by mouth every 8 (eight) hours as needed for nausea or vomiting., Disp: 20 tablet, Rfl: 0   predniSONE (DELTASONE) 10 MG tablet, Take 1  tablet (10 mg total) by mouth in the morning, at noon, and at bedtime., Disp: 15 tablet, Rfl: 0   pseudoephedrine (SUDAFED) 30 MG tablet, Take 1 tablet (30 mg total) by mouth 2 (two) times daily., Disp: 20 tablet, Rfl: 0   risperiDONE (RISPERDAL) 2 MG tablet, Take 1 tablet (2 mg total) by mouth at bedtime., Disp: 14 tablet, Rfl: 0  Observations/Objective: Patient is well-developed, well-nourished in no acute distress.  Resting comfortably Head is normocephalic, atraumatic.  No labored breathing.  Speech is clear and coherent with logical content.  Patient is alert and oriented at baseline.    Assessment and Plan: 1. Fibromyalgia - naproxen (NAPROSYN) 500 MG tablet; Take 1 tablet (500 mg total) by mouth 2 (two) times daily with a meal.  Dispense: 30 tablet; Refill: 0  2. Chronic pain of both knees - naproxen (NAPROSYN) 500 MG tablet; Take 1 tablet (500 mg total) by mouth 2 (two) times daily with a meal.  Dispense: 30 tablet; Refill: 0  - Naproxen added for knee pain and swelling - Continue Pregabalin as prescribed - Ice for swelling - Elevate legs when at rest - Work note provided - Follow up in person if worsening or fails to improve   Follow Up Instructions: I discussed the assessment and treatment plan with the patient. The patient was provided an opportunity to ask questions and all were answered. The patient agreed with the plan and demonstrated an understanding of the instructions.  A copy of instructions were sent to the patient via MyChart unless otherwise noted below.    The patient was advised to call back or seek an in-person evaluation if the symptoms worsen or if the condition fails to improve as anticipated.  Time:  I spent 8 minutes with the patient via telehealth technology discussing the above problems/concerns.    Dawn Loveless, PA-C

## 2022-10-31 NOTE — Patient Instructions (Signed)
Dawn Horton, thank you for joining Dawn Loveless, PA-C for today's virtual visit.  While this provider is not your primary care provider (PCP), if your PCP is located in our provider database this encounter information will be shared with them immediately following your visit.   A [facility] MyChart account gives you access to today's visit and all your visits, tests, and labs performed at [facility] " click here if you don't have a [facility] MyChart account or go to [email or web address]  Consent: (Patient) Dawn Horton provided verbal consent for this virtual visit at the beginning of the encounter.  Current Medications:  Current Outpatient Medications:    naproxen (NAPROSYN) 500 MG tablet, Take 1 tablet (500 mg total) by mouth 2 (two) times daily with a meal., Disp: 30 tablet, Rfl: 0   cetirizine (ZYRTEC) 10 MG tablet, Take 1 tablet (10 mg total) by mouth daily., Disp: 30 tablet, Rfl: 2   cyclobenzaprine (FLEXERIL) 5 MG tablet, Take 1 tablet (5 mg total) by mouth 3 (three) times daily as needed for muscle spasms., Disp: 60 tablet, Rfl: 0   fluticasone (FLONASE) 50 MCG/ACT nasal spray, Place 2 sprays into both nostrils daily., Disp: 16 mL, Rfl: 2   folic acid (FOLVITE) 1 MG tablet, Take 1 mg by mouth daily., Disp: , Rfl:    lithium carbonate (ESKALITH) 450 MG CR tablet, Take 450 mg by mouth daily. , Disp: , Rfl:    methotrexate (RHEUMATREX) 2.5 MG tablet, Take by mouth., Disp: , Rfl:    ondansetron (ZOFRAN-ODT) 4 MG disintegrating tablet, Take 1 tablet (4 mg total) by mouth every 8 (eight) hours as needed for nausea or vomiting., Disp: 20 tablet, Rfl: 0   predniSONE (DELTASONE) 10 MG tablet, Take 1 tablet (10 mg total) by mouth in the morning, at noon, and at bedtime., Disp: 15 tablet, Rfl: 0   pseudoephedrine (SUDAFED) 30 MG tablet, Take 1 tablet (30 mg total) by mouth 2 (two) times daily., Disp: 20 tablet, Rfl: 0   risperiDONE (RISPERDAL) 2 MG tablet, Take 1 tablet  (2 mg total) by mouth at bedtime., Disp: 14 tablet, Rfl: 0   Medications ordered in this encounter:  Meds ordered this encounter  Medications   naproxen (NAPROSYN) 500 MG tablet    Sig: Take 1 tablet (500 mg total) by mouth 2 (two) times daily with a meal.    Dispense:  30 tablet    Refill:  0    Order Specific Question:   Supervising Provider    Answer:   Merrilee Jansky [id number]     *If you need refills on other medications prior to your next appointment, please contact your pharmacy*  Follow-Up: Call back or seek an in-person evaluation if the symptoms worsen or if the condition fails to improve as anticipated.  [facility] [phone]  Other Instructions Myofascial Pain Syndrome and Fibromyalgia Myofascial pain syndrome and fibromyalgia are both pain disorders. You may feel this pain mainly in your muscles. Myofascial pain syndrome: Always has tender points in the muscles that will cause pain when pressed (trigger points). The pain may come and go. Usually affects your neck, upper back, and shoulder areas. The pain often moves into your arms and hands. Fibromyalgia: Has muscle pains and tenderness that come and go. Is often associated with tiredness (fatigue) and sleep problems. Has trigger points. Tends to be long-lasting (chronic), but is not life-threatening. Fibromyalgia and myofascial pain syndrome are not the same. However, they often  occur together. If you have both conditions, each can make the other worse. Both are common and can cause enough pain and fatigue to make day-to-day activities difficult. Both can be hard to diagnose because their symptoms are common in many other conditions. What are the causes? The exact causes of these conditions are not known. What increases the risk? You are more likely to develop either of these conditions if: You have a family history of the condition. You are female. You have certain triggers, such as: Spine  disorders. An injury (trauma) or other physical stressors. Being under a lot of stress. Medical conditions such as osteoarthritis, rheumatoid arthritis, or lupus. What are the signs or symptoms? Fibromyalgia The main symptom of fibromyalgia is widespread pain and tenderness in your muscles. Pain is sometimes described as stabbing, shooting, or burning. You may also have: Tingling or numbness. Sleep problems and fatigue. Problems with attention and concentration (fibro fog). Other symptoms may include: Bowel and bladder problems. Headaches. Vision problems. Sensitivity to odors and noises. Depression or mood changes. Painful menstrual periods (dysmenorrhea). Dry skin or eyes. These symptoms can vary over time. Myofascial pain syndrome Symptoms of myofascial pain syndrome include: Tight, ropy bands of muscle. Uncomfortable sensations in muscle areas. These may include aching, cramping, burning, numbness, tingling, and weakness. Difficulty moving certain parts of the body freely (poor range of motion). How is this diagnosed? This condition may be diagnosed by your symptoms and medical history. You will also have a physical exam. In general: Fibromyalgia is diagnosed if you have pain, fatigue, and other symptoms for more than 3 months, and symptoms cannot be explained by another condition. Myofascial pain syndrome is diagnosed if you have trigger points in your muscles, and those trigger points are tender and cause pain elsewhere in your body (referred pain). How is this treated? Treatment for these conditions depends on the type that you have. For fibromyalgia, a healthy lifestyle is the most important treatment including aerobic and strength exercises. Different types of medicines are used to help treat pain and include: NSAIDs. Medicines for treating depression. Medicines that help control seizures. Medicines that relax the muscles. Treatment for myofascial pain syndrome  includes: Pain medicines, such as NSAIDs. Cooling and stretching of muscles. Massage therapy with myofascial release technique. Trigger point injections. Treating these conditions often requires a team of health care providers. These may include: Your primary care provider. A physical therapist. Complementary health care providers, such as massage therapists or acupuncturists. A psychiatrist for cognitive behavioral therapy. Follow these instructions at home: Medicines Take over-the-counter and prescription medicines only as told by your health care provider. Ask your health care provider if the medicine prescribed to you: Requires you to avoid driving or using machinery. Can cause constipation. You may need to take these actions to prevent or treat constipation: Drink enough fluid to keep your urine pale yellow. Take over-the-counter or prescription medicines. Eat foods that are high in fiber, such as beans, whole grains, and fresh fruits and vegetables. Limit foods that are high in fat and processed sugars, such as fried or sweet foods. Lifestyle  Do exercises as told by your health care provider or physical therapist. Practice relaxation techniques to control your stress. You may want to try: Biofeedback. Visual imagery. Hypnosis. Muscle relaxation. Yoga. Meditation. Maintain a healthy lifestyle. This includes eating a healthy diet and getting enough sleep. Do not use any products that contain nicotine or tobacco. These products include cigarettes, chewing tobacco, and vaping devices, such as  e-cigarettes. If you need help quitting, ask your health care provider. General instructions Talk to your health care provider about complementary treatments, such as acupuncture or massage. Do not do activities that stress or strain your muscles. This includes repetitive motions and heavy lifting. Keep all follow-up visits. This is important. Where to find support Consider joining a  support group with others who are diagnosed with this condition. National Fibromyalgia Association: fmaware.org Where to find more information U.S. Pain Foundation: uspainfoundation.org Contact a health care provider if: You have new symptoms. Your symptoms get worse or your pain is severe. You have side effects from your medicines. You have trouble sleeping. Your condition is causing depression or anxiety. Get help right away if: You have thoughts of hurting yourself or others. Get help right away if you feel like you may hurt yourself or others, or have thoughts about taking your own life. Go to your nearest emergency room or: Call 911. Call the National Suicide Prevention Lifeline at 334-848-2865 or 988. This is open 24 hours a day. Text the Crisis Text Line at (862) 187-0740. This information is not intended to replace advice given to you by your health care provider. Make sure you discuss any questions you have with your health care provider. Document Revised: 11/11/2021 Document Reviewed: 01/04/2021 Elsevier Patient Education  2024 Elsevier Inc.    If you have been instructed to have an in-person evaluation today at a local Urgent Care facility, please use the link below. It will take you to a list of all of our available [facility], including address, phone number and hours of operation. Please do not delay care.  [facility]  If you or a family member do not have a primary care provider, use the link below to schedule a visit and establish care. When you choose a [facility] primary care physician or advanced practice provider, you gain a long-term partner in health. Find a Primary Care Provider  Learn more about [facility]'s in-office and virtual care options: [facility] Get Care Now

## 2022-11-07 ENCOUNTER — Telehealth: Payer: MEDICAID | Admitting: Physician Assistant

## 2022-11-07 DIAGNOSIS — M797 Fibromyalgia: Secondary | ICD-10-CM | POA: Diagnosis not present

## 2022-11-07 NOTE — Progress Notes (Signed)
Virtual Visit Consent   [name], you are scheduled for a virtual visit with a [facility] provider today. Just as with appointments in the office, your consent must be obtained to participate. Your consent will be active for this visit and any virtual visit you may have with one of our providers in the next 365 days. If you have a MyChart account, a copy of this consent can be sent to you electronically.  As this is a virtual visit, video technology does not allow for your provider to perform a traditional examination. This may limit your provider's ability to fully assess your condition. If your provider identifies any concerns that need to be evaluated in person or the need to arrange testing (such as labs, EKG, etc.), we will make arrangements to do so. Although advances in technology are sophisticated, we cannot ensure that it will always work on either your end or our end. If the connection with a video visit is poor, the visit may have to be switched to a telephone visit. With either a video or telephone visit, we are not always able to ensure that we have a secure connection.  By engaging in this virtual visit, you consent to the provision of healthcare and authorize for your insurance to be billed (if applicable) for the services provided during this visit. Depending on your insurance coverage, you may receive a charge related to this service.  I need to obtain your verbal consent now. Are you willing to proceed with your visit today? Dawn Horton has provided verbal consent on 11/07/2022 for a virtual visit (video or telephone). [name], PA-C  Date: 11/07/2022 7:36 PM  Virtual Visit via Video Note   IMargaretann Loveless, connected with  Dawn Horton  ([id number], [birth year]) on 11/07/22 at  7:30 PM EDT by a video-enabled telemedicine application and verified that I am speaking with the correct person using two identifiers.  Location: Patient: Virtual Visit Location Patient:  Home Provider: Virtual Visit Location Provider: Home Office   I discussed the limitations of evaluation and management by telemedicine and the availability of in person appointments. The patient expressed understanding and agreed to proceed.    History of Present Illness: Dawn Horton is a 37 y.o. who identifies as a female who was assigned female at birth, and is being seen today for fibromyalgia flare. Started in her back yesterday and moved to her right hip. Did take Naproxen that was prescribed last week and it has helped some. Does have Flexeril 5mg  but that makes her too drowsy and limits use. She has used Prednisone before, but prefers to not use it at this time as she reports her WBC count is elevated and something is going on with her thyroid that they are investigating. She is in need of a work note.    Problems:  Patient Active Problem List   Diagnosis Date Noted   Bipolar affective disorder, current episode manic with psychotic symptoms (HCC) 05/21/2016    Allergies:  Allergies  Allergen Reactions   Abilify [Aripiprazole] Other (See Comments)    Makes pt delirious and forget things.    Ibuprofen Nausea Only   Penicillins Hives    amoxicillin   Medications:  Current Outpatient Medications:    cetirizine (ZYRTEC) 10 MG tablet, Take 1 tablet (10 mg total) by mouth daily., Disp: 30 tablet, Rfl: 2   cyclobenzaprine (FLEXERIL) 5 MG tablet, Take 1 tablet (5 mg total) by mouth 3 (three) times daily as  needed for muscle spasms., Disp: 60 tablet, Rfl: 0   fluticasone (FLONASE) 50 MCG/ACT nasal spray, Place 2 sprays into both nostrils daily., Disp: 16 mL, Rfl: 2   folic acid (FOLVITE) 1 MG tablet, Take 1 mg by mouth daily., Disp: , Rfl:    lithium carbonate (ESKALITH) 450 MG CR tablet, Take 450 mg by mouth daily. , Disp: , Rfl:    methotrexate (RHEUMATREX) 2.5 MG tablet, Take by mouth., Disp: , Rfl:    naproxen (NAPROSYN) 500 MG tablet, Take 1 tablet (500 mg total) by mouth 2 (two)  times daily with a meal., Disp: 30 tablet, Rfl: 0   ondansetron (ZOFRAN-ODT) 4 MG disintegrating tablet, Take 1 tablet (4 mg total) by mouth every 8 (eight) hours as needed for nausea or vomiting., Disp: 20 tablet, Rfl: 0   predniSONE (DELTASONE) 10 MG tablet, Take 1 tablet (10 mg total) by mouth in the morning, at noon, and at bedtime., Disp: 15 tablet, Rfl: 0   pseudoephedrine (SUDAFED) 30 MG tablet, Take 1 tablet (30 mg total) by mouth 2 (two) times daily., Disp: 20 tablet, Rfl: 0   risperiDONE (RISPERDAL) 2 MG tablet, Take 1 tablet (2 mg total) by mouth at bedtime., Disp: 14 tablet, Rfl: 0  Observations/Objective: Patient is well-developed, well-nourished in no acute distress.  Resting comfortably at home.  Head is normocephalic, atraumatic.  No labored breathing.  Speech is clear and coherent with logical content.  Patient is alert and oriented at baseline.    Assessment and Plan: There are no diagnoses linked to this encounter. - Continue naproxen - Flexeril as tolerated - Epsom salt soaks - Follow up in person if symptoms do not improve or worsen  Follow Up Instructions: I discussed the assessment and treatment plan with the patient. The patient was provided an opportunity to ask questions and all were answered. The patient agreed with the plan and demonstrated an understanding of the instructions.  A copy of instructions were sent to the patient via MyChart unless otherwise noted below.    The patient was advised to call back or seek an in-person evaluation if the symptoms worsen or if the condition fails to improve as anticipated.  Time:  I spent 8 minutes with the patient via telehealth technology discussing the above problems/concerns.    [name], PA-C

## 2022-11-07 NOTE — Patient Instructions (Signed)
  [name], thank you for joining [name], PA-C for today's virtual visit.  While this provider is not your primary care provider (PCP), if your PCP is located in our provider database this encounter information will be shared with them immediately following your visit.   A [facility] MyChart account gives you access to today's visit and all your visits, tests, and labs performed at [facility] " click here if you don't have a [facility] MyChart account or go to [email or web address]  Consent: (Patient) Dawn Horton provided verbal consent for this virtual visit at the beginning of the encounter.  Current Medications:  Current Outpatient Medications:    cetirizine (ZYRTEC) 10 MG tablet, Take 1 tablet (10 mg total) by mouth daily., Disp: 30 tablet, Rfl: 2   cyclobenzaprine (FLEXERIL) 5 MG tablet, Take 1 tablet (5 mg total) by mouth 3 (three) times daily as needed for muscle spasms., Disp: 60 tablet, Rfl: 0   fluticasone (FLONASE) 50 MCG/ACT nasal spray, Place 2 sprays into both nostrils daily., Disp: 16 mL, Rfl: 2   folic acid (FOLVITE) 1 MG tablet, Take 1 mg by mouth daily., Disp: , Rfl:    lithium carbonate (ESKALITH) 450 MG CR tablet, Take 450 mg by mouth daily. , Disp: , Rfl:    methotrexate (RHEUMATREX) 2.5 MG tablet, Take by mouth., Disp: , Rfl:    naproxen (NAPROSYN) 500 MG tablet, Take 1 tablet (500 mg total) by mouth 2 (two) times daily with a meal., Disp: 30 tablet, Rfl: 0   ondansetron (ZOFRAN-ODT) 4 MG disintegrating tablet, Take 1 tablet (4 mg total) by mouth every 8 (eight) hours as needed for nausea or vomiting., Disp: 20 tablet, Rfl: 0   predniSONE (DELTASONE) 10 MG tablet, Take 1 tablet (10 mg total) by mouth in the morning, at noon, and at bedtime., Disp: 15 tablet, Rfl: 0   pseudoephedrine (SUDAFED) 30 MG tablet, Take 1 tablet (30 mg total) by mouth 2 (two) times daily., Disp: 20 tablet, Rfl: 0   risperiDONE (RISPERDAL) 2 MG tablet, Take 1 tablet  (2 mg total) by mouth at bedtime., Disp: 14 tablet, Rfl: 0   Medications ordered in this encounter:  No orders of the defined types were placed in this encounter.    *If you need refills on other medications prior to your next appointment, please contact your pharmacy*  Follow-Up: Call back or seek an in-person evaluation if the symptoms worsen or if the condition fails to improve as anticipated.  [facility] [phone]    If you have been instructed to have an in-person evaluation today at a local Urgent Care facility, please use the link below. It will take you to a list of all of our available [facility], including address, phone number and hours of operation. Please do not delay care.  [facility]  If you or a family member do not have a primary care provider, use the link below to schedule a visit and establish care. When you choose a [facility] primary care physician or advanced practice provider, you gain a long-term partner in health. Find a Primary Care Provider  Learn more about [facility]'s in-office and virtual care options: [facility] Get Care Now

## 2022-11-14 ENCOUNTER — Telehealth: Payer: MEDICAID | Admitting: Nurse Practitioner

## 2022-11-14 DIAGNOSIS — M797 Fibromyalgia: Secondary | ICD-10-CM | POA: Diagnosis not present

## 2022-11-14 MED ORDER — ACETAMINOPHEN ER 650 MG PO TBCR
650.0000 mg | EXTENDED_RELEASE_TABLET | Freq: Three times a day (TID) | ORAL | 0 refills | Status: AC | PRN
Start: 2022-11-14 — End: 2023-02-12

## 2022-11-14 NOTE — Progress Notes (Signed)
Virtual Visit Consent   Dawn Horton, you are scheduled for a virtual visit with a [facility] provider today. Just as with appointments in the office, your consent must be obtained to participate. Your consent will be active for this visit and any virtual visit you may have with one of our providers in the next 365 days. If you have a MyChart account, a copy of this consent can be sent to you electronically.  As this is a virtual visit, video technology does not allow for your provider to perform a traditional examination. This may limit your provider's ability to fully assess your condition. If your provider identifies any concerns that need to be evaluated in person or the need to arrange testing (such as labs, EKG, etc.), we will make arrangements to do so. Although advances in technology are sophisticated, we cannot ensure that it will always work on either your end or our end. If the connection with a video visit is poor, the visit may have to be switched to a telephone visit. With either a video or telephone visit, we are not always able to ensure that we have a secure connection.  By engaging in this virtual visit, you consent to the provision of healthcare and authorize for your insurance to be billed (if applicable) for the services provided during this visit. Depending on your insurance coverage, you may receive a charge related to this service.  I need to obtain your verbal consent now. Are you willing to proceed with your visit today? Dawn Horton has provided verbal consent on 11/14/2022 for a virtual visit (video or telephone). Viviano Simas, FNP  Date: 11/14/2022 12:07 PM  Virtual Visit via Video Note   I, Viviano Simas, connected with  Dawn Horton  ([id number], 06/22/1985) on 11/14/22 at 12:15 PM EDT by a video-enabled telemedicine application and verified that I am speaking with the correct person using two identifiers.  Location: Patient: Virtual Visit Location Patient: Home Provider:  Virtual Visit Location Provider: Home Office   I discussed the limitations of evaluation and management by telemedicine and the availability of in person appointments. The patient expressed understanding and agreed to proceed.    History of Present Illness: Dawn Horton is a 37 y.o. who identifies as a female who was assigned female at birth, and is being seen today for ongoing recurrent fibromyalgia flare  Today she is having pain in her left hip, pain migrates to different joints   She is using Naproxen  Has run out of tylenol - requesting Rx  Uses Flexeril as needed   She goes to [facility] and has a specialist as well, she is in the process of getting accomodation with work and she is awaiting completion  Is requesting a work note for today     Problems:  Patient Active Problem List   Diagnosis Date Noted   Bipolar affective disorder, current episode manic with psychotic symptoms (HCC) 05/21/2016    Allergies:  Allergies  Allergen Reactions   Abilify [Aripiprazole] Other (See Comments)    Makes pt delirious and forget things.    Ibuprofen Nausea Only   Penicillins Hives    amoxicillin   Medications:  Current Outpatient Medications:    cetirizine (ZYRTEC) 10 MG tablet, Take 1 tablet (10 mg total) by mouth daily., Disp: 30 tablet, Rfl: 2   cyclobenzaprine (FLEXERIL) 5 MG tablet, Take 1 tablet (5 mg total) by mouth 3 (three) times daily as needed for muscle spasms., Disp: 60 tablet, Rfl:  0   fluticasone (FLONASE) 50 MCG/ACT nasal spray, Place 2 sprays into both nostrils daily., Disp: 16 mL, Rfl: 2   folic acid (FOLVITE) 1 MG tablet, Take 1 mg by mouth daily., Disp: , Rfl:    lithium carbonate (ESKALITH) 450 MG CR tablet, Take 450 mg by mouth daily. , Disp: , Rfl:    methotrexate (RHEUMATREX) 2.5 MG tablet, Take by mouth., Disp: , Rfl:    naproxen (NAPROSYN) 500 MG tablet, Take 1 tablet (500 mg total) by mouth 2 (two) times daily with a meal., Disp: 30 tablet, Rfl: 0    ondansetron (ZOFRAN-ODT) 4 MG disintegrating tablet, Take 1 tablet (4 mg total) by mouth every 8 (eight) hours as needed for nausea or vomiting., Disp: 20 tablet, Rfl: 0   predniSONE (DELTASONE) 10 MG tablet, Take 1 tablet (10 mg total) by mouth in the morning, at noon, and at bedtime., Disp: 15 tablet, Rfl: 0   pseudoephedrine (SUDAFED) 30 MG tablet, Take 1 tablet (30 mg total) by mouth 2 (two) times daily., Disp: 20 tablet, Rfl: 0   risperiDONE (RISPERDAL) 2 MG tablet, Take 1 tablet (2 mg total) by mouth at bedtime., Disp: 14 tablet, Rfl: 0  Observations/Objective: Patient is well-developed, well-nourished in no acute distress.  Resting comfortably  at home.  Head is normocephalic, atraumatic.  No labored breathing.  Speech is clear and coherent with logical content.  Patient is alert and oriented at baseline.    Assessment and Plan:   1. Fibromyalgia Continue Naproxen as needed may alternate with tylenol   Follow up with specialist for further direction   - acetaminophen (TYLENOL 8 HOUR) 650 MG CR tablet; Take 1 tablet (650 mg total) by mouth every 8 (eight) hours as needed for pain.  Dispense: 90 tablet; Refill: 0     Follow Up Instructions: I discussed the assessment and treatment plan with the patient. The patient was provided an opportunity to ask questions and all were answered. The patient agreed with the plan and demonstrated an understanding of the instructions.  A copy of instructions were sent to the patient via MyChart unless otherwise noted below.     The patient was advised to call back or seek an in-person evaluation if the symptoms worsen or if the condition fails to improve as anticipated.  Time:  I spent 11 minutes with the patient via telehealth technology discussing the above problems/concerns.    Viviano Simas, FNP

## 2022-11-19 DIAGNOSIS — Z62810 Personal history of physical and sexual abuse in childhood: Secondary | ICD-10-CM | POA: Insufficient documentation

## 2022-11-21 ENCOUNTER — Telehealth: Payer: MEDICAID | Admitting: Physician Assistant

## 2022-11-21 DIAGNOSIS — M545 Low back pain, unspecified: Secondary | ICD-10-CM | POA: Diagnosis not present

## 2022-11-21 MED ORDER — CYCLOBENZAPRINE HCL 5 MG PO TABS
5.0000 mg | ORAL_TABLET | Freq: Three times a day (TID) | ORAL | 0 refills | Status: DC | PRN
Start: 2022-11-21 — End: 2023-01-27

## 2022-11-21 NOTE — Patient Instructions (Signed)
[name], thank you for joining [name], PA-C for today's virtual visit.  While this provider is not your primary care provider (PCP), if your PCP is located in our provider database this encounter information will be shared with them immediately following your visit.   A [facility] MyChart account gives you access to today's visit and all your visits, tests, and labs performed at [facility] " click here if you don't have a [facility] MyChart account or go to [email or web address]  Consent: (Patient) Darleny Sem provided verbal consent for this virtual visit at the beginning of the encounter.  Current Medications:  Current Outpatient Medications:    acetaminophen (TYLENOL 8 HOUR) 650 MG CR tablet, Take 1 tablet (650 mg total) by mouth every 8 (eight) hours as needed for pain., Disp: 90 tablet, Rfl: 0   cetirizine (ZYRTEC) 10 MG tablet, Take 1 tablet (10 mg total) by mouth daily., Disp: 30 tablet, Rfl: 2   cyclobenzaprine (FLEXERIL) 5 MG tablet, Take 1 tablet (5 mg total) by mouth 3 (three) times daily as needed for muscle spasms., Disp: 60 tablet, Rfl: 0   fluticasone (FLONASE) 50 MCG/ACT nasal spray, Place 2 sprays into both nostrils daily., Disp: 16 mL, Rfl: 2   folic acid (FOLVITE) 1 MG tablet, Take 1 mg by mouth daily., Disp: , Rfl:    lithium carbonate (ESKALITH) 450 MG CR tablet, Take 450 mg by mouth daily. , Disp: , Rfl:    methotrexate (RHEUMATREX) 2.5 MG tablet, Take by mouth., Disp: , Rfl:    naproxen (NAPROSYN) 500 MG tablet, Take 1 tablet (500 mg total) by mouth 2 (two) times daily with a meal., Disp: 30 tablet, Rfl: 0   ondansetron (ZOFRAN-ODT) 4 MG disintegrating tablet, Take 1 tablet (4 mg total) by mouth every 8 (eight) hours as needed for nausea or vomiting., Disp: 20 tablet, Rfl: 0   pseudoephedrine (SUDAFED) 30 MG tablet, Take 1 tablet (30 mg total) by mouth 2 (two) times daily., Disp: 20 tablet, Rfl: 0   risperiDONE (RISPERDAL) 2 MG tablet,  Take 1 tablet (2 mg total) by mouth at bedtime., Disp: 14 tablet, Rfl: 0   Medications ordered in this encounter:  Meds ordered this encounter  Medications   cyclobenzaprine (FLEXERIL) 5 MG tablet    Sig: Take 1 tablet (5 mg total) by mouth 3 (three) times daily as needed for muscle spasms.    Dispense:  60 tablet    Refill:  0    Order Specific Question:   Supervising Provider    Answer:   Merrilee Jansky [id number]     *If you need refills on other medications prior to your next appointment, please contact your pharmacy*  Follow-Up: Call back or seek an in-person evaluation if the symptoms worsen or if the condition fails to improve as anticipated.  [facility] [phone]   If you have been instructed to have an in-person evaluation today at a local Urgent Care facility, please use the link below. It will take you to a list of all of our available [facility], including address, phone number and hours of operation. Please do not delay care.  [facility]  If you or a family member do not have a primary care provider, use the link below to schedule a visit and establish care. When you choose a [facility] primary care physician or advanced practice provider, you gain a long-term partner in health. Find a Primary  Care Provider  Learn more about [facility]'s in-office and virtual care options: [facility] - Get Care Now

## 2022-11-21 NOTE — Progress Notes (Signed)
Virtual Visit Consent   Dawn Horton, you are scheduled for a virtual visit with a [facility] provider today. Just as with appointments in the office, your consent must be obtained to participate. Your consent will be active for this visit and any virtual visit you may have with one of our providers in the next 365 days. If you have a MyChart account, a copy of this consent can be sent to you electronically.  As this is a virtual visit, video technology does not allow for your provider to perform a traditional examination. This may limit your provider's ability to fully assess your condition. If your provider identifies any concerns that need to be evaluated in person or the need to arrange testing (such as labs, EKG, etc.), we will make arrangements to do so. Although advances in technology are sophisticated, we cannot ensure that it will always work on either your end or our end. If the connection with a video visit is poor, the visit may have to be switched to a telephone visit. With either a video or telephone visit, we are not always able to ensure that we have a secure connection.  By engaging in this virtual visit, you consent to the provision of healthcare and authorize for your insurance to be billed (if applicable) for the services provided during this visit. Depending on your insurance coverage, you may receive a charge related to this service.  I need to obtain your verbal consent now. Are you willing to proceed with your visit today? Dailah Opperman has provided verbal consent on 11/21/2022 for a virtual visit (video or telephone). [name], PA-C  Date: 11/21/2022 6:43 PM  Virtual Visit via Video Note   I, [name], connected with  Dawn Horton  ([id number], [birth year]) on 11/21/22 at  6:45 PM EDT by a video-enabled telemedicine application and verified that I am speaking with the correct person using two identifiers.  Location: Patient: Virtual Visit Location Patient:  Home Provider: Virtual Visit Location Provider: Home Office   I discussed the limitations of evaluation and management by telemedicine and the availability of in person appointments. The patient expressed understanding and agreed to proceed.    History of Present Illness: Dawn Horton is a 37 y.o. who identifies as a female who was assigned female at birth, and is being seen today for fibromyalgia flare. Having sharp pains and muscle tightness in right hamstring making it difficult for her to stand or walk.   PMH Fibromyalgia  Reports uses Pregabalin, Ibuprofen, Tylenol and muscle relaxer during flares.  States has an appt with her Neurologist, Dr. Tyler Deis, tomorrow.  Reports being in the process of getting work accommodations.   Problems:  Patient Active Problem List   Diagnosis Date Noted   Bipolar affective disorder, current episode manic with psychotic symptoms (HCC) 05/21/2016    Allergies:  Allergies  Allergen Reactions   Abilify [Aripiprazole] Other (See Comments)    Makes pt delirious and forget things.    Ibuprofen Nausea Only   Penicillins Hives    amoxicillin   Medications:  Current Outpatient Medications:    acetaminophen (TYLENOL 8 HOUR) 650 MG CR tablet, Take 1 tablet (650 mg total) by mouth every 8 (eight) hours as needed for pain., Disp: 90 tablet, Rfl: 0   cetirizine (ZYRTEC) 10 MG tablet, Take 1 tablet (10 mg total) by mouth daily., Disp: 30 tablet, Rfl: 2   cyclobenzaprine (FLEXERIL) 5 MG tablet, Take 1 tablet (5 mg total) by mouth  3 (three) times daily as needed for muscle spasms., Disp: 60 tablet, Rfl: 0   fluticasone (FLONASE) 50 MCG/ACT nasal spray, Place 2 sprays into both nostrils daily., Disp: 16 mL, Rfl: 2   folic acid (FOLVITE) 1 MG tablet, Take 1 mg by mouth daily., Disp: , Rfl:    lithium carbonate (ESKALITH) 450 MG CR tablet, Take 450 mg by mouth daily. , Disp: , Rfl:    methotrexate (RHEUMATREX) 2.5 MG tablet, Take by mouth., Disp: , Rfl:     naproxen (NAPROSYN) 500 MG tablet, Take 1 tablet (500 mg total) by mouth 2 (two) times daily with a meal., Disp: 30 tablet, Rfl: 0   ondansetron (ZOFRAN-ODT) 4 MG disintegrating tablet, Take 1 tablet (4 mg total) by mouth every 8 (eight) hours as needed for nausea or vomiting., Disp: 20 tablet, Rfl: 0   predniSONE (DELTASONE) 10 MG tablet, Take 1 tablet (10 mg total) by mouth in the morning, at noon, and at bedtime., Disp: 15 tablet, Rfl: 0   pseudoephedrine (SUDAFED) 30 MG tablet, Take 1 tablet (30 mg total) by mouth 2 (two) times daily., Disp: 20 tablet, Rfl: 0   risperiDONE (RISPERDAL) 2 MG tablet, Take 1 tablet (2 mg total) by mouth at bedtime., Disp: 14 tablet, Rfl: 0  Observations/Objective: Patient is well-developed, well-nourished in no acute distress.  Resting comfortably at home.  Head is normocephalic, atraumatic.  No labored breathing.  Speech is clear and coherent with logical content.  Patient is alert and oriented at baseline.    Assessment and Plan: There are no diagnoses linked to this encounter. - Flexeril refilled - Advised to space out by a couple of hours from Pregabalin - Keep scheduled appt with Neurology for work accommodations based off their recommendations - Work note provided tonight, but will be unable to continue to provide work notes until seen in person as these are becoming regularly requested from the virtual department  Follow Up Instructions: I discussed the assessment and treatment plan with the patient. The patient was provided an opportunity to ask questions and all were answered. The patient agreed with the plan and demonstrated an understanding of the instructions.  A copy of instructions were sent to the patient via MyChart unless otherwise noted below.    The patient was advised to call back or seek an in-person evaluation if the symptoms worsen or if the condition fails to improve as anticipated.     [name], PA-C

## 2022-11-25 ENCOUNTER — Telehealth: Payer: MEDICAID | Admitting: Physician Assistant

## 2022-11-25 DIAGNOSIS — Z0289 Encounter for other administrative examinations: Secondary | ICD-10-CM

## 2022-11-25 NOTE — Progress Notes (Signed)
Virtual Visit Consent   Dawn Horton, you are scheduled for a virtual visit with a [facility] provider today. Just as with appointments in the office, your consent must be obtained to participate. Your consent will be active for this visit and any virtual visit you may have with one of our providers in the next 365 days. If you have a MyChart account, a copy of this consent can be sent to you electronically.  As this is a virtual visit, video technology does not allow for your provider to perform a traditional examination. This may limit your provider's ability to fully assess your condition. If your provider identifies any concerns that need to be evaluated in person or the need to arrange testing (such as labs, EKG, etc.), we will make arrangements to do so. Although advances in technology are sophisticated, we cannot ensure that it will always work on either your end or our end. If the connection with a video visit is poor, the visit may have to be switched to a telephone visit. With either a video or telephone visit, we are not always able to ensure that we have a secure connection.  By engaging in this virtual visit, you consent to the provision of healthcare and authorize for your insurance to be billed (if applicable) for the services provided during this visit. Depending on your insurance coverage, you may receive a charge related to this service.  I need to obtain your verbal consent now. Are you willing to proceed with your visit today? [name] has provided verbal consent on 11/25/2022 for a virtual visit (video or telephone). Dawn Horton, [place]  Date: 11/25/2022 11:17 AM  Virtual Visit via Video Note   I, Dawn Horton, connected with  Charisse Wendell  ([id number], 1986-01-19) on 11/25/22 at 11:15 AM EDT by a video-enabled telemedicine application and verified that I am speaking with the correct person using two identifiers.  Location: Patient: Virtual Visit Location Patient:  Home Provider: Virtual Visit Location Provider: Home Office   I discussed the limitations of evaluation and management by telemedicine and the availability of in person appointments. The patient expressed understanding and agreed to proceed.    History of Present Illness: Dawn Horton is a 37 y.o. who identifies as a female who was assigned female at birth, and is being seen today requesting work note for yesterday and today due to continued flare of her fibromyalgia. Was seen multiple times in the past few weeks for work note by our team and outside organization via telehealth. Most recently seen by our team on 10/4 where she was to restart her naprosyn and flexeril and follow-up with her Neurologist. She endorses having an appointment tomorrow with them.   HPI: HPI  Problems:  Patient Active Problem List   Diagnosis Date Noted   Bipolar affective disorder, current episode manic with psychotic symptoms (HCC) 05/21/2016    Allergies:  Allergies  Allergen Reactions   Abilify [Aripiprazole] Other (See Comments)    Makes pt delirious and forget things.    Ibuprofen Nausea Only   Penicillins Hives    amoxicillin   Medications:  Current Outpatient Medications:    acetaminophen (TYLENOL 8 HOUR) 650 MG CR tablet, Take 1 tablet (650 mg total) by mouth every 8 (eight) hours as needed for pain., Disp: 90 tablet, Rfl: 0   cetirizine (ZYRTEC) 10 MG tablet, Take 1 tablet (10 mg total) by mouth daily., Disp: 30 tablet, Rfl: 2   cyclobenzaprine (FLEXERIL) 5 MG  tablet, Take 1 tablet (5 mg total) by mouth 3 (three) times daily as needed for muscle spasms., Disp: 60 tablet, Rfl: 0   fluticasone (FLONASE) 50 MCG/ACT nasal spray, Place 2 sprays into both nostrils daily., Disp: 16 mL, Rfl: 2   folic acid (FOLVITE) 1 MG tablet, Take 1 mg by mouth daily., Disp: , Rfl:    lithium carbonate (ESKALITH) 450 MG CR tablet, Take 450 mg by mouth daily. , Disp: , Rfl:    methotrexate (RHEUMATREX) 2.5 MG tablet, Take  by mouth., Disp: , Rfl:    naproxen (NAPROSYN) 500 MG tablet, Take 1 tablet (500 mg total) by mouth 2 (two) times daily with a meal., Disp: 30 tablet, Rfl: 0   ondansetron (ZOFRAN-ODT) 4 MG disintegrating tablet, Take 1 tablet (4 mg total) by mouth every 8 (eight) hours as needed for nausea or vomiting., Disp: 20 tablet, Rfl: 0   pseudoephedrine (SUDAFED) 30 MG tablet, Take 1 tablet (30 mg total) by mouth 2 (two) times daily., Disp: 20 tablet, Rfl: 0   risperiDONE (RISPERDAL) 2 MG tablet, Take 1 tablet (2 mg total) by mouth at bedtime., Disp: 14 tablet, Rfl: 0  Observations/Objective: Patient is well-developed, well-nourished in no acute distress.  Resting comfortably at home.  Head is normocephalic, atraumatic.  No labored breathing. Speech is clear and coherent with logical content.  Patient is alert and oriented at baseline.   Assessment and Plan: 1. Encounter to obtain excuse from work  She was instructed to follow-up with her specialist that she has an appointment with tomorrow, as she has already been informed we cannot provide further work notes for her for this acute flare. She voiced understanding and agreement. No charge for visit.   Follow Up Instructions: I discussed the assessment and treatment plan with the patient. The patient was provided an opportunity to ask questions and all were answered. The patient agreed with the plan and demonstrated an understanding of the instructions.  A copy of instructions were sent to the patient via MyChart unless otherwise noted below.   The patient was advised to call back or seek an in-person evaluation if the symptoms worsen or if the condition fails to improve as anticipated.  Time:  I spent 10 minutes with the patient via telehealth technology discussing the above problems/concerns.    Dawn Climes, PA-C

## 2022-12-15 ENCOUNTER — Ambulatory Visit: Payer: MEDICAID

## 2022-12-15 ENCOUNTER — Other Ambulatory Visit: Payer: Self-pay

## 2022-12-15 ENCOUNTER — Ambulatory Visit
Admission: EM | Admit: 2022-12-15 | Discharge: 2022-12-15 | Disposition: A | Discharge status: Home / Self Care | Payer: MEDICAID | Attending: Internal Medicine | Admitting: Internal Medicine

## 2022-12-15 DIAGNOSIS — L0501 Pilonidal cyst with abscess: Secondary | ICD-10-CM

## 2022-12-15 MED ORDER — DOXYCYCLINE HYCLATE 100 MG PO CAPS: 100.0000 mg | ORAL_CAPSULE | Freq: Two times a day (BID) | ORAL | 0 refills | Status: DC

## 2022-12-15 NOTE — ED Provider Notes (Signed)
[facility]    CSN: [id number] Arrival date & time: 12/15/22  1839      History   Chief Complaint No chief complaint on file.   HPI Dawn Horton is a 37 y.o. female.   Patient presents with "knot" on her tailbone that she noticed about 2 days ago.  Denies any injury to the area.  Denies history of the same.  She does report that she has a history of hidradenitis but has never had an abscess in this area.  Denies any drainage from the area.  Denies any fever.     Past Medical History:  Diagnosis Date   Anemia    Bipolar 1 disorder (HCC)    Depression    GERD (gastroesophageal reflux disease)    Tendinitis     Patient Active Problem List   Diagnosis Date Noted   Bipolar affective disorder, current episode manic with psychotic symptoms (HCC) 05/21/2016    Past Surgical History:  Procedure Laterality Date   CESAREAN SECTION      OB History     Gravida  3   Para  2   Term  2   Preterm  0   AB  1   Living  2      SAB  1   IAB  0   Ectopic  0   Multiple  0   Live Births  2            Home Medications    Prior to Admission medications   Medication Sig Start Date End Date Taking? Authorizing Provider  doxycycline (VIBRAMYCIN) 100 MG capsule Take 1 capsule (100 mg total) by mouth 2 (two) times daily. 12/15/22  Yes Augustine Brannick, Acie Fredrickson, FNP  acetaminophen (TYLENOL 8 HOUR) 650 MG CR tablet Take 1 tablet (650 mg total) by mouth every 8 (eight) hours as needed for pain. 11/14/22 02/12/23  Viviano Simas, FNP  cetirizine (ZYRTEC) 10 MG tablet Take 1 tablet (10 mg total) by mouth daily. 12/10/21   Daphine Deutscher, Mary-Margaret, FNP  cyclobenzaprine (FLEXERIL) 5 MG tablet Take 1 tablet (5 mg total) by mouth 3 (three) times daily as needed for muscle spasms. 11/21/22   [name], PA-C  fluticasone (FLONASE) 50 MCG/ACT nasal spray Place 2 sprays into both nostrils daily. 04/03/22   Ellsworth Lennox, PA-C  folic acid (FOLVITE) 1 MG tablet Take 1 mg by  mouth daily. 09/12/20   [provider]  lithium carbonate (ESKALITH) 450 MG CR tablet Take 450 mg by mouth daily.     [provider]  methotrexate (RHEUMATREX) 2.5 MG tablet Take by mouth. 10/17/20   [provider]  naproxen (NAPROSYN) 500 MG tablet Take 1 tablet (500 mg total) by mouth 2 (two) times daily with a meal. 10/31/22   Burnette, Alessandra Bevels, PA-C  ondansetron (ZOFRAN-ODT) 4 MG disintegrating tablet Take 1 tablet (4 mg total) by mouth every 8 (eight) hours as needed for nausea or vomiting. 07/01/21   Waldon Merl, PA-C  pseudoephedrine (SUDAFED) 30 MG tablet Take 1 tablet (30 mg total) by mouth 2 (two) times daily. 04/03/22   Ellsworth Lennox, PA-C  risperiDONE (RISPERDAL) 2 MG tablet Take 1 tablet (2 mg total) by mouth at bedtime. 05/20/16   Roxy Horseman, PA-C  traZODone (DESYREL) 25 mg TABS tablet Take 50 mg by mouth at bedtime.  11/08/19  [provider]    Family History Family History  Problem Relation Age of Onset   Healthy Mother  Healthy Father     Social History Social History   Tobacco Use   Smoking status: Every Day    Current packs/day: 1.00    Average packs/day: 1 pack/day for 15.0 years (15.0 ttl pk-yrs)    Types: Cigarettes   Smokeless tobacco: Never  Substance Use Topics   Alcohol use: Yes    Comment: Occ   Drug use: Yes    Types: Marijuana    Comment: daily     Allergies   Abilify [aripiprazole], Ibuprofen, and Penicillins   Review of Systems Review of Systems Per HPI  Physical Exam Triage Vital Signs ED Triage Vitals  Encounter Vitals Group     BP 12/15/22 1940 119/83     Systolic BP Percentile --      Diastolic BP Percentile --      Pulse Rate 12/15/22 1940 73     Resp --      Temp 12/15/22 1940 98.3 F (36.8 C)     Temp Source 12/15/22 1940 Oral     SpO2 12/15/22 1940 99 %     Weight 12/15/22 1939 190 lb (86.2 kg)     Height 12/15/22 1939 5' 4.5" (1.638 m)     Head Circumference --      Peak  Flow --      Pain Score 12/15/22 1939 8     Pain Loc --      Pain Education --      Exclude from Growth Chart --    No data found.  Updated Vital Signs BP 119/83 (BP Location: Left Arm)   Pulse 73   Temp 98.3 F (36.8 C) (Oral)   Ht 5' 4.5" (1.638 m)   Wt 190 lb (86.2 kg)   SpO2 99%   BMI 32.11 kg/m   Visual Acuity Right Eye Distance:   Left Eye Distance:   Bilateral Distance:    Right Eye Near:   Left Eye Near:    Bilateral Near:     Physical Exam Exam conducted with a chaperone present.  Constitutional:      General: She is not in acute distress.    Appearance: Normal appearance. She is not toxic-appearing or diaphoretic.  HENT:     Head: Normocephalic and atraumatic.  Eyes:     Extraocular Movements: Extraocular movements intact.     Conjunctiva/sclera: Conjunctivae normal.  Pulmonary:     Effort: Pulmonary effort is normal.  Skin:    Comments: Patient has a palpable area of swelling present to the mid upper intergluteal cleft overlying at the end of the coccyx.  No significant discoloration.  No purulent drainage.  Neurological:     General: No focal deficit present.     Mental Status: She is alert and oriented to person, place, and time. Mental status is at baseline.  Psychiatric:        Mood and Affect: Mood normal.        Behavior: Behavior normal.        Thought Content: Thought content normal.        Judgment: Judgment normal.      UC Treatments / Results  Labs (all labs ordered are listed, but only abnormal results are displayed) Labs Reviewed - No data to display  EKG   Radiology DG Sacrum/Coccyx  Result Date: 12/15/2022 CLINICAL DATA:  Tailbone pain. EXAM: SACRUM AND COCCYX - 2+ VIEW COMPARISON:  None Available. FINDINGS: There is no evidence of fracture or other focal bone lesions. Cortical margins  of the sacrum and coccyx are intact. The sacral ala are maintained. Sacroiliac joints are congruent. IMPRESSION: Negative radiographs of the  sacrum and coccyx. Electronically Signed   By: Narda Rutherford M.D.   On: 12/15/2022 21:37    Procedures Procedures (including critical care time)  Medications Ordered in UC Medications - No data to display  Initial Impression / Assessment and Plan / UC Course  I have reviewed the triage vital signs and the nursing notes.  Pertinent labs & imaging results that were available during my care of the patient were reviewed by me and considered in my medical decision making (see chart for details).     X-ray was completed given physical exam was not obvious for pilonidal abscess.  This x-ray was negative for any acute bony abnormality.  I am most suspicious of pilonidal abscess given area of swelling so will treat with doxycycline.  Patient advised of warm compresses and monitoring closely for any worsening or persistent symptoms.  Advised strict follow-up if any symptoms persist or worsen.  Advised PCP follow up. Patient verbalized understanding and was agreeable with plan. Final Clinical Impressions(s) / UC Diagnoses   Final diagnoses:  Pilonidal abscess     Discharge Instructions      I will call if x-ray results are abnormal.  I am suspicious that you have an abscess in the area so I have prescribed an antibiotic to take for this.  Take with food to avoid stomach upset.  Follow-up if any symptoms persist or worsen.    ED Prescriptions     Medication Sig Dispense Auth. Provider   doxycycline (VIBRAMYCIN) 100 MG capsule Take 1 capsule (100 mg total) by mouth 2 (two) times daily. 20 capsule Gustavus Bryant, [place]      PDMP not reviewed this encounter.   Gustavus Bryant, [place] 12/16/22 [phone]

## 2022-12-15 NOTE — Discharge Instructions (Addendum)
I will call if x-ray results are abnormal.  I am suspicious that you have an abscess in the area so I have prescribed an antibiotic to take for this.  Take with food to avoid stomach upset.  Follow-up if any symptoms persist or worsen.

## 2022-12-15 NOTE — ED Triage Notes (Signed)
Patient presents with knot on tailbone that is bothering her. Patient states it is painful, she just noticed it on Saturday night.

## 2023-01-06 DIAGNOSIS — J329 Chronic sinusitis, unspecified: Secondary | ICD-10-CM | POA: Insufficient documentation

## 2023-01-06 DIAGNOSIS — R519 Headache, unspecified: Secondary | ICD-10-CM | POA: Insufficient documentation

## 2023-01-27 ENCOUNTER — Telehealth: Payer: MEDICAID | Admitting: Physician Assistant

## 2023-01-27 DIAGNOSIS — M797 Fibromyalgia: Secondary | ICD-10-CM | POA: Diagnosis not present

## 2023-01-27 MED ORDER — TIZANIDINE HCL 4 MG PO TABS
4.0000 mg | ORAL_TABLET | Freq: Four times a day (QID) | ORAL | 0 refills | Status: DC | PRN
Start: 2023-01-27 — End: 2023-08-01

## 2023-01-27 MED ORDER — CELECOXIB 100 MG PO CAPS
100.0000 mg | ORAL_CAPSULE | Freq: Two times a day (BID) | ORAL | 0 refills | Status: DC
Start: 2023-01-27 — End: 2023-08-01

## 2023-01-27 NOTE — Progress Notes (Signed)
Virtual Visit Consent   Dawn Horton, you are scheduled for a virtual visit with a [facility] provider today. Just as with appointments in the office, your consent must be obtained to participate. Your consent will be active for this visit and any virtual visit you may have with one of our providers in the next 365 days. If you have a MyChart account, a copy of this consent can be sent to you electronically.  As this is a virtual visit, video technology does not allow for your provider to perform a traditional examination. This may limit your provider's ability to fully assess your condition. If your provider identifies any concerns that need to be evaluated in person or the need to arrange testing (such as labs, EKG, etc.), we will make arrangements to do so. Although advances in technology are sophisticated, we cannot ensure that it will always work on either your end or our end. If the connection with a video visit is poor, the visit may have to be switched to a telephone visit. With either a video or telephone visit, we are not always able to ensure that we have a secure connection.  By engaging in this virtual visit, you consent to the provision of healthcare and authorize for your insurance to be billed (if applicable) for the services provided during this visit. Depending on your insurance coverage, you may receive a charge related to this service.  I need to obtain your verbal consent now. Are you willing to proceed with your visit today? [name] has provided verbal consent on 01/27/2023 for a virtual visit (video or telephone). [name], [place]  Date: 01/27/2023 6:02 PM  Virtual Visit via Video Note   I, [name], connected with  Kingsleigh Lozoya  ([id number], Jun 09, 1985) on 01/27/23 at  5:45 PM EST by a video-enabled telemedicine application and verified that I am speaking with the correct person using two identifiers.  Location: Patient: Virtual Visit Location Patient:  Home Provider: Virtual Visit Location Provider: Home Office   I discussed the limitations of evaluation and management by telemedicine and the availability of in person appointments. The patient expressed understanding and agreed to proceed.    History of Present Illness: Dawn Horton is a 37 y.o. who identifies as a female who was assigned female at birth, and is being seen today for flare of her fibromyalgia over the past 3 days, predominantly in the back of the knees and now to her hips bilaterally with R > L.  Denies fevers, chills. Has restarted use of her Cyclobenzaprine, Aleve and still using her Lyrica with only some relief in symptoms.   HPI: HPI  Problems:  Patient Active Problem List   Diagnosis Date Noted   Bipolar affective disorder, current episode manic with psychotic symptoms (HCC) 05/21/2016    Allergies:  Allergies  Allergen Reactions   Abilify [Aripiprazole] Other (See Comments)    Makes pt delirious and forget things.    Ibuprofen Nausea Only   Penicillins Hives    amoxicillin   Medications:  Current Outpatient Medications:    celecoxib (CELEBREX) 100 MG capsule, Take 1 capsule (100 mg total) by mouth 2 (two) times daily., Disp: 30 capsule, Rfl: 0   tiZANidine (ZANAFLEX) 4 MG tablet, Take 1 tablet (4 mg total) by mouth every 6 (six) hours as needed for muscle spasms., Disp: 15 tablet, Rfl: 0   acetaminophen (TYLENOL 8 HOUR) 650 MG CR tablet, Take 1 tablet (650 mg total) by mouth every  8 (eight) hours as needed for pain., Disp: 90 tablet, Rfl: 0   cetirizine (ZYRTEC) 10 MG tablet, Take 1 tablet (10 mg total) by mouth daily., Disp: 30 tablet, Rfl: 2   fluticasone (FLONASE) 50 MCG/ACT nasal spray, Place 2 sprays into both nostrils daily., Disp: 16 mL, Rfl: 2   folic acid (FOLVITE) 1 MG tablet, Take 1 mg by mouth daily., Disp: , Rfl:    lithium carbonate (ESKALITH) 450 MG CR tablet, Take 450 mg by mouth daily. , Disp: , Rfl:    methotrexate (RHEUMATREX) 2.5 MG tablet,  Take by mouth., Disp: , Rfl:    ondansetron (ZOFRAN-ODT) 4 MG disintegrating tablet, Take 1 tablet (4 mg total) by mouth every 8 (eight) hours as needed for nausea or vomiting., Disp: 20 tablet, Rfl: 0   pseudoephedrine (SUDAFED) 30 MG tablet, Take 1 tablet (30 mg total) by mouth 2 (two) times daily., Disp: 20 tablet, Rfl: 0   risperiDONE (RISPERDAL) 2 MG tablet, Take 1 tablet (2 mg total) by mouth at bedtime., Disp: 14 tablet, Rfl: 0  Observations/Objective: Patient is well-developed, well-nourished in no acute distress.  Resting comfortably  at home.  Head is normocephalic, atraumatic.  No labored breathing.  Speech is clear and coherent with logical content.  Patient is alert and oriented at baseline.    Assessment and Plan: 1. Fibromyalgia - tiZANidine (ZANAFLEX) 4 MG tablet; Take 1 tablet (4 mg total) by mouth every 6 (six) hours as needed for muscle spasms.  Dispense: 15 tablet; Refill: 0 - celecoxib (CELEBREX) 100 MG capsule; Take 1 capsule (100 mg total) by mouth 2 (two) times daily.  Dispense: 30 capsule; Refill: 0  Flare of acute symptoms. Supportive measures and OTC medications reviewed. Stop Flexeril and start trial of tizanidine. Celebrex per orders. She is to call and arrange follow-up with her managing specialist. Work note provided.   Follow Up Instructions: I discussed the assessment and treatment plan with the patient. The patient was provided an opportunity to ask questions and all were answered. The patient agreed with the plan and demonstrated an understanding of the instructions.  A copy of instructions were sent to the patient via MyChart unless otherwise noted below.   The patient was advised to call back or seek an in-person evaluation if the symptoms worsen or if the condition fails to improve as anticipated.    [name], PA-C

## 2023-01-27 NOTE — Patient Instructions (Addendum)
[name], thank you for joining [name], PA-C for today's virtual visit.  While this provider is not your primary care provider (PCP), if your PCP is located in our provider database this encounter information will be shared with them immediately following your visit.   A [facility] MyChart account gives you access to today's visit and all your visits, tests, and labs performed at [facility] " click here if you don't have a [facility] MyChart account or go to [email or web address]  Consent: (Patient) Dawn Horton provided verbal consent for this virtual visit at the beginning of the encounter.  Current Medications:  Current Outpatient Medications:    acetaminophen (TYLENOL 8 HOUR) 650 MG CR tablet, Take 1 tablet (650 mg total) by mouth every 8 (eight) hours as needed for pain., Disp: 90 tablet, Rfl: 0   cetirizine (ZYRTEC) 10 MG tablet, Take 1 tablet (10 mg total) by mouth daily., Disp: 30 tablet, Rfl: 2   cyclobenzaprine (FLEXERIL) 5 MG tablet, Take 1 tablet (5 mg total) by mouth 3 (three) times daily as needed for muscle spasms., Disp: 60 tablet, Rfl: 0   doxycycline (VIBRAMYCIN) 100 MG capsule, Take 1 capsule (100 mg total) by mouth 2 (two) times daily., Disp: 20 capsule, Rfl: 0   fluticasone (FLONASE) 50 MCG/ACT nasal spray, Place 2 sprays into both nostrils daily., Disp: 16 mL, Rfl: 2   folic acid (FOLVITE) 1 MG tablet, Take 1 mg by mouth daily., Disp: , Rfl:    lithium carbonate (ESKALITH) 450 MG CR tablet, Take 450 mg by mouth daily. , Disp: , Rfl:    methotrexate (RHEUMATREX) 2.5 MG tablet, Take by mouth., Disp: , Rfl:    naproxen (NAPROSYN) 500 MG tablet, Take 1 tablet (500 mg total) by mouth 2 (two) times daily with a meal., Disp: 30 tablet, Rfl: 0   ondansetron (ZOFRAN-ODT) 4 MG disintegrating tablet, Take 1 tablet (4 mg total) by mouth every 8 (eight) hours as needed for nausea or vomiting., Disp: 20 tablet, Rfl: 0   pseudoephedrine (SUDAFED) 30 MG  tablet, Take 1 tablet (30 mg total) by mouth 2 (two) times daily., Disp: 20 tablet, Rfl: 0   risperiDONE (RISPERDAL) 2 MG tablet, Take 1 tablet (2 mg total) by mouth at bedtime., Disp: 14 tablet, Rfl: 0   Medications ordered in this encounter:  No orders of the defined types were placed in this encounter.    *If you need refills on other medications prior to your next appointment, please contact your pharmacy*  Follow-Up: Call back or seek an in-person evaluation if the symptoms worsen or if the condition fails to improve as anticipated.  [facility] [phone]  Other Instructions Please stop the Advil and Flexeril. Continue the Pregabalin Start the Celebrex and Tizanidine as discussed.  Gentle stretching. Follow-up with your specialist for ongoing management.   If you have been instructed to have an in-person evaluation today at a local Urgent Care facility, please use the link below. It will take you to a list of all of our available [facility], including address, phone number and hours of operation. Please do not delay care.  [facility]  If you or a family member do not have a primary care provider, use the link below to schedule a visit and establish care. When you choose a  primary care physician or advanced practice provider, you gain a long-term partner in health. Find a Primary Care Provider  Learn  more about [facility] in-office and virtual care options: [facility] Get Care Now

## 2023-01-29 ENCOUNTER — Emergency Department (HOSPITAL_BASED_OUTPATIENT_CLINIC_OR_DEPARTMENT_OTHER)
Admission: EM | Admit: 2023-01-29 | Discharge: 2023-01-29 | Disposition: A | Discharge status: Home / Self Care | Payer: MEDICAID | Attending: Emergency Medicine | Admitting: Emergency Medicine

## 2023-01-29 ENCOUNTER — Ambulatory Visit
Admission: EM | Admit: 2023-01-29 | Discharge: 2023-01-29 | Disposition: A | Discharge status: Home / Self Care | Payer: MEDICAID

## 2023-01-29 ENCOUNTER — Other Ambulatory Visit: Payer: Self-pay

## 2023-01-29 ENCOUNTER — Encounter (HOSPITAL_BASED_OUTPATIENT_CLINIC_OR_DEPARTMENT_OTHER): Payer: Self-pay | Admitting: Emergency Medicine

## 2023-01-29 ENCOUNTER — Emergency Department (HOSPITAL_BASED_OUTPATIENT_CLINIC_OR_DEPARTMENT_OTHER): Payer: MEDICAID

## 2023-01-29 DIAGNOSIS — M25549 Pain in joints of unspecified hand: Secondary | ICD-10-CM | POA: Insufficient documentation

## 2023-01-29 DIAGNOSIS — M7121 Synovial cyst of popliteal space [Baker], right knee: Secondary | ICD-10-CM | POA: Insufficient documentation

## 2023-01-29 DIAGNOSIS — M79604 Pain in right leg: Secondary | ICD-10-CM | POA: Diagnosis present

## 2023-01-29 DIAGNOSIS — M79661 Pain in right lower leg: Secondary | ICD-10-CM

## 2023-01-29 MED ORDER — OXYCODONE-ACETAMINOPHEN 5-325 MG PO TABS: 1.0000 | ORAL_TABLET | Freq: Four times a day (QID) | ORAL | 0 refills | Status: DC | PRN

## 2023-01-29 MED ORDER — OXYCODONE-ACETAMINOPHEN 5-325 MG PO TABS
1.0000 | ORAL_TABLET | Freq: Once | ORAL | Status: AC
  Administered 2023-01-29: 1 via ORAL
  Filled 2023-01-29: qty 1

## 2023-01-29 NOTE — ED Triage Notes (Signed)
"  I have fibromyalgia and now my right leg has been bothering me since last Friday". "The pain started behind my right knee, then continued around and down leg, back of my calf muscle is very tight and hurting extending down to foot sometimes". No numbness. No tingling. Note: "I did fall down escalator over the summer (July/Aug 2024), not seen anywhere for this injury". "I had pain in this areas and others then but cleared up fast".

## 2023-01-29 NOTE — Discharge Instructions (Signed)
Please read and follow all provided instructions.  Your diagnoses today include:  1. Synovial cyst of right popliteal space     Tests performed today include: Ultrasound of your right lower extremity did not show a blood clot but you do have a fluid collection called a Bakers cyst Vital signs. See below for your results today.   Medications prescribed:  Percocet (oxycodone/acetaminophen) - narcotic pain medication  DO NOT drive or perform any activities that require you to be awake and alert because this medicine can make you drowsy. BE VERY CAREFUL not to take multiple medicines containing Tylenol (also called acetaminophen). Doing so can lead to an overdose which can damage your liver and cause liver failure and possibly death.  Take any prescribed medications only as directed.  Home care instructions:  Follow any educational materials contained in this packet Continue the Celebrex that was prescribed previously as this will help with inflammation Follow R.I.C.E. Protocol: R - rest your injury  I  - use ice on injury without applying directly to skin C - compress injury with bandage or splint E - elevate the injury as much as possible  Follow-up instructions: Please follow-up with your primary care provider or the provided orthopedic physician (bone specialist) if you continue to have significant pain in 1 week. In this case you may have a more severe injury that requires further care.   Return instructions:  Please return if your toes or feet are numb or tingling, appear gray or blue, or you have severe pain (also elevate the leg and loosen splint or wrap if you were given one) Please return to the Emergency Department if you experience worsening symptoms.  Please return if you have any other emergent concerns.  Additional Information:  Your vital signs today were: BP 113/83   Pulse 76   Temp 97.7 F (36.5 C) (Oral)   Resp 20   LMP  (LMP Unknown)   SpO2 100%  If your  blood pressure (BP) was elevated above 135/85 this visit, please have this repeated by your doctor within one month.

## 2023-01-29 NOTE — ED Notes (Signed)
Patient is being discharged from the Urgent Care and sent to the Emergency Department via private vehicle . Per R. Lenise Arena PA, patient is in need of higher level of care due to r/o DVT. Patient is aware and verbalizes understanding of plan of care.  Vitals:   01/29/23 1507  BP: (!) 98/59  Pulse: 70  Resp: 18  Temp: 98.8 F (37.1 C)  SpO2: 98%

## 2023-01-29 NOTE — ED Triage Notes (Signed)
Pt was seen at urgent care, she was instructed to come here for ultrasound ,possible dvt. She has fibromyalgia and has right leg /calf pain and tightness since Friday.

## 2023-01-29 NOTE — ED Provider Notes (Signed)
Patient here today for evaluation of right leg pain that is been worsening for the last week.  She reports that pain started behind her right knee and has continued around her lower leg slightly up her leg as well.  Right lower leg is significantly swollen and does feel tight.  Weightbearing worsens pain.  She denies any numbness or tingling.  She does not report any shortness of breath.  Concerns for possible DVT given presentation and recommended further evaluation in the emergency room.  Patient is agreeable to same.   Tomi Bamberger, PA-C 01/29/23 1550

## 2023-01-29 NOTE — ED Provider Notes (Signed)
[place] EMERGENCY DEPARTMENT AT [facility] Provider Note   CSN: [id number] Arrival date & time: 01/29/23  1655     History  Chief Complaint  Patient presents with   Leg Swelling    Dawn Horton is a 37 y.o. female.  Patient with history of fibromyalgia presents to the emergency department for evaluation of right leg pain.  She was referred from urgent care for DVT study.  Patient states that she has been dealing with pain, typically in her leg, since she was 37 years old.  She was diagnosed with fibromyalgia about a year ago.  She has been taking her home treatments which include Flexeril, Celebrex.  She denies any recent falls or injuries.  No history of blood clots.  No recent travel.  No fevers, distal numbness or tingling.  Patient is able to ambulate.       Home Medications Prior to Admission medications   Medication Sig Start Date End Date Taking? Authorizing Provider  acetaminophen (TYLENOL 8 HOUR) 650 MG CR tablet Take 1 tablet (650 mg total) by mouth every 8 (eight) hours as needed for pain. 11/14/22 02/12/23  Viviano Simas, FNP  celecoxib (CELEBREX) 100 MG capsule Take 1 capsule (100 mg total) by mouth 2 (two) times daily. 01/27/23   Waldon Merl, PA-C  cetirizine (ZYRTEC) 10 MG tablet Take 1 tablet (10 mg total) by mouth daily. 12/10/21   Daphine Deutscher Mary-Margaret, FNP  cyclobenzaprine (FLEXERIL) 5 MG tablet Take 10 mg by mouth 3 (three) times daily as needed for muscle spasms.    [provider]  doxycycline (MONODOX) 100 MG capsule Take 100 mg by mouth 2 (two) times daily. 01/21/23 02/11/23  [provider]  DULoxetine (CYMBALTA) 30 MG capsule Take 60 mg by mouth daily.    [provider]  etonogestrel (NEXPLANON) 68 MG IMPL implant 1 each by Subdermal route once.    [provider]  fluticasone (FLONASE) 50 MCG/ACT nasal spray Place 2 sprays into both nostrils daily. 04/03/22   Ellsworth Lennox, PA-C  folic acid (FOLVITE) 1 MG  tablet Take 1 mg by mouth daily. 09/12/20   [provider]  hydrOXYzine (VISTARIL) 50 MG capsule Take 50 mg by mouth 3 (three) times daily as needed.    [provider]  lithium carbonate (ESKALITH) 450 MG CR tablet Take 450 mg by mouth daily.     [provider]  methotrexate (RHEUMATREX) 2.5 MG tablet Take by mouth. 10/17/20   [provider]  mycophenolate (CELLCEPT) 500 MG tablet Take 1,000 mg by mouth 2 (two) times daily.    [provider]  naproxen (NAPROSYN) 500 MG tablet Take 500 mg by mouth 2 (two) times daily with a meal. 06/09/20   [provider]  naproxen sodium (ALEVE) 220 MG tablet Take 220 mg by mouth daily as needed.    [provider]  olopatadine (PATANOL) 0.1 % ophthalmic solution Place 1 drop into both eyes 2 (two) times daily. 02/02/20   [provider]  ondansetron (ZOFRAN-ODT) 4 MG disintegrating tablet Take 1 tablet (4 mg total) by mouth every 8 (eight) hours as needed for nausea or vomiting. 07/01/21   Waldon Merl, PA-C  pregabalin (LYRICA) 100 MG capsule 100 mg 3 (three) times daily. 08/18/22   [provider]  pseudoephedrine (SUDAFED) 30 MG tablet Take 1 tablet (30 mg total) by mouth 2 (two) times daily. 04/03/22   Ellsworth Lennox, PA-C  risperiDONE (RISPERDAL) 2 MG tablet Take 1  tablet (2 mg total) by mouth at bedtime. 05/20/16   Roxy Horseman, PA-C  SODIUM FLUORIDE, DENTAL RINSE, 0.2 % SOLN See Admin Instructions. 12/25/22   [provider]  tiZANidine (ZANAFLEX) 4 MG tablet Take 1 tablet (4 mg total) by mouth every 6 (six) hours as needed for muscle spasms. 01/27/23   Waldon Merl, PA-C  trimethoprim-polymyxin b (POLYTRIM) ophthalmic solution Place 1 drop into both eyes as directed. 01/17/20   [provider]  traZODone (DESYREL) 25 mg TABS tablet Take 50 mg by mouth at bedtime.  11/08/19  [provider]      Allergies    Aripiprazole, Penicillins, and  Ibuprofen    Review of Systems   Review of Systems  Physical Exam Updated Vital Signs BP 113/83   Pulse 76   Temp 97.7 F (36.5 C) (Oral)   Resp 20   LMP  (LMP Unknown)   SpO2 100%   Physical Exam Vitals and nursing note reviewed.  Constitutional:      General: She is not in acute distress.    Appearance: She is well-developed.  HENT:     Head: Normocephalic and atraumatic.     Right Ear: External ear normal.     Left Ear: External ear normal.     Nose: Nose normal.  Eyes:     Conjunctiva/sclera: Conjunctivae normal.  Cardiovascular:     Rate and Rhythm: Normal rate and regular rhythm.     Heart sounds: No murmur heard. Pulmonary:     Effort: No respiratory distress.     Breath sounds: No wheezing, rhonchi or rales.  Abdominal:     Palpations: Abdomen is soft.     Tenderness: There is no abdominal tenderness. There is no guarding or rebound.  Musculoskeletal:     Cervical back: Normal range of motion and neck supple.     Right lower leg: Edema present.     Left lower leg: No edema.     Comments: Patient with tenderness and fullness over the right calf and the popliteal area.  2+ pedal pulses bilaterally.  Distal sensation intact.  Patient with full active range of motion.  Skin:    General: Skin is warm and dry.     Findings: No rash.  Neurological:     General: No focal deficit present.     Mental Status: She is alert. Mental status is at baseline.     Motor: No weakness.  Psychiatric:        Mood and Affect: Mood normal.     ED Results / Procedures / Treatments   Labs (all labs ordered are listed, but only abnormal results are displayed) Labs Reviewed - No data to display  EKG None  Radiology US Venous Img Lower Right (DVT Study) Result Date: 01/29/2023 CLINICAL DATA:  Right lower extremity pain and swelling recent fall downstairs EXAM: RIGHT LOWER EXTREMITY VENOUS DOPPLER ULTRASOUND TECHNIQUE: Gray-scale sonography with graded compression, as well as  color Doppler and duplex ultrasound were performed to evaluate the lower extremity deep venous systems from the level of the common femoral vein and including the common femoral, femoral, profunda femoral, popliteal and calf veins including the posterior tibial, peroneal and gastrocnemius veins when visible. The superficial great saphenous vein was also interrogated. Spectral Doppler was utilized to evaluate flow at rest and with distal augmentation maneuvers in the common femoral, femoral and popliteal veins. COMPARISON:  None Available. FINDINGS: Contralateral Common Femoral Vein: Respiratory phasicity is normal and symmetric  with the symptomatic side. No evidence of thrombus. Normal compressibility. Common Femoral Vein: No evidence of thrombus. Normal compressibility, respiratory phasicity and response to augmentation. Saphenofemoral Junction: No evidence of thrombus. Normal compressibility and flow on color Doppler imaging. Profunda Femoral Vein: No evidence of thrombus. Normal compressibility and flow on color Doppler imaging. Femoral Vein: No evidence of thrombus. Normal compressibility, respiratory phasicity and response to augmentation. Popliteal Vein: No evidence of thrombus. Normal compressibility, respiratory phasicity and response to augmentation. Calf Veins: No evidence of thrombus. Normal compressibility and flow on color Doppler imaging. Superficial Great Saphenous Vein: No evidence of thrombus. Normal compressibility. Other Findings: Right popliteal fossa minimally complex large cyst or fluid collection with internal septation measures 8.7 x 3.1 x 4.3 cm. No associated vascularity. Appearance compatible with a large Baker's cyst. IMPRESSION: 1. No evidence of deep venous thrombosis. 2. 8.7 cm right popliteal fossa Baker's cyst. Electronically Signed   By: Judie Petit.  Shick M.D.   On: 01/29/2023 18:57    Procedures Procedures    Medications Ordered in ED Medications  oxyCODONE-acetaminophen  (PERCOCET/ROXICET) 5-325 MG per tablet 1 tablet (has no administration in time range)    ED Course/ Medical Decision Making/ A&P    Patient seen and examined. History obtained directly from patient.   Labs/EKG: None ordered  Imaging: Right lower extremity DVT study has been performed, awaiting results.  Medications/Fluids: Ordered: P.o. Percocet.   Most recent vital signs reviewed and are as follows: BP 113/83   Pulse 76   Temp 97.7 F (36.5 C) (Oral)   Resp 20   LMP  (LMP Unknown)   SpO2 100%   Initial impression: Right leg pain, tenderness and swelling, rule out DVT  7:20 PM Reassessment performed. Patient appears stable.  Imaging results reviewed including: Ultrasound negative for DVT, positive for large Baker's cyst  Reviewed pertinent lab work and imaging with patient at bedside. Questions answered.   Most current vital signs reviewed and are as follows: BP 113/83   Pulse 76   Temp 97.7 F (36.5 C) (Oral)   Resp 20   LMP  (LMP Unknown)   SpO2 100%   Plan: Discharge to home.  Will provide with Ace wrap prior to discharge  Prescriptions written for: Percocet # 6 tablets, patient already prescribed Celebrex which she will continue to take  Other home care instructions discussed: Elevation  ED return instructions discussed: Worsening pain, swelling, fevers, focal redness or warmth  Follow-up instructions discussed: Patient encouraged to follow-up with their orthopedist in 1 week.  I provided a referral.  Patient states that she was already referred by another specialist and she will follow-up on that as well.                                Medical Decision Making Risk Prescription drug management.   Patient with right lower extremity edema, concerning for DVT, ruled out with ultrasound.  However she does have a Baker's cyst which is likely the cause of her symptoms.  She has underlying fibromyalgia as well.  No fevers or other constitutional symptoms to  suggest septic arthritis.  She has good pulses and distal sensation and I do not suspect arterial compromise.        Final Clinical Impression(s) / ED Diagnoses Final diagnoses:  Synovial cyst of right popliteal space    Rx / DC Orders ED Discharge Orders  Ordered    oxyCODONE-acetaminophen (PERCOCET/ROXICET) 5-325 MG tablet  Every 6 hours PRN        01/29/23 1918              Renne Crigler, Cordelia Poche 01/29/23 1921    Rondel Baton, MD 01/31/23 1332

## 2023-02-03 ENCOUNTER — Telehealth: Payer: MEDICAID | Admitting: Physician Assistant

## 2023-02-03 DIAGNOSIS — M7121 Synovial cyst of popliteal space [Baker], right knee: Secondary | ICD-10-CM

## 2023-02-03 NOTE — Patient Instructions (Signed)
[name], thank you for joining [name], PA-C for today's virtual visit.  While this provider is not your primary care provider (PCP), if your PCP is located in our provider database this encounter information will be shared with them immediately following your visit.   A [facility] MyChart account gives you access to today's visit and all your visits, tests, and labs performed at [facility] " click here if you don't have a  MyChart account or go to [email or web address]  Consent: (Patient) Dawn Horton provided verbal consent for this virtual visit at the beginning of the encounter.  Current Medications:  Current Outpatient Medications:    acetaminophen (TYLENOL 8 HOUR) 650 MG CR tablet, Take 1 tablet (650 mg total) by mouth every 8 (eight) hours as needed for pain., Disp: 90 tablet, Rfl: 0   celecoxib (CELEBREX) 100 MG capsule, Take 1 capsule (100 mg total) by mouth 2 (two) times daily., Disp: 30 capsule, Rfl: 0   cetirizine (ZYRTEC) 10 MG tablet, Take 1 tablet (10 mg total) by mouth daily., Disp: 30 tablet, Rfl: 2   cyclobenzaprine (FLEXERIL) 5 MG tablet, Take 10 mg by mouth 3 (three) times daily as needed for muscle spasms., Disp: , Rfl:    doxycycline (MONODOX) 100 MG capsule, Take 100 mg by mouth 2 (two) times daily., Disp: , Rfl:    DULoxetine (CYMBALTA) 30 MG capsule, Take 60 mg by mouth daily., Disp: , Rfl:    etonogestrel (NEXPLANON) 68 MG IMPL implant, 1 each by Subdermal route once., Disp: , Rfl:    fluticasone (FLONASE) 50 MCG/ACT nasal spray, Place 2 sprays into both nostrils daily., Disp: 16 mL, Rfl: 2   folic acid (FOLVITE) 1 MG tablet, Take 1 mg by mouth daily., Disp: , Rfl:    hydrOXYzine (VISTARIL) 50 MG capsule, Take 50 mg by mouth 3 (three) times daily as needed., Disp: , Rfl:    lithium carbonate (ESKALITH) 450 MG CR tablet, Take 450 mg by mouth daily. , Disp: , Rfl:    methotrexate (RHEUMATREX) 2.5 MG tablet, Take by mouth.,  Disp: , Rfl:    mycophenolate (CELLCEPT) 500 MG tablet, Take 1,000 mg by mouth 2 (two) times daily., Disp: , Rfl:    naproxen (NAPROSYN) 500 MG tablet, Take 500 mg by mouth 2 (two) times daily with a meal., Disp: , Rfl:    naproxen sodium (ALEVE) 220 MG tablet, Take 220 mg by mouth daily as needed., Disp: , Rfl:    olopatadine (PATANOL) 0.1 % ophthalmic solution, Place 1 drop into both eyes 2 (two) times daily., Disp: , Rfl:    ondansetron (ZOFRAN-ODT) 4 MG disintegrating tablet, Take 1 tablet (4 mg total) by mouth every 8 (eight) hours as needed for nausea or vomiting., Disp: 20 tablet, Rfl: 0   oxyCODONE-acetaminophen (PERCOCET/ROXICET) 5-325 MG tablet, Take 1 tablet by mouth every 6 (six) hours as needed for severe pain (pain score 7-10)., Disp: 6 tablet, Rfl: 0   pregabalin (LYRICA) 100 MG capsule, 100 mg 3 (three) times daily., Disp: , Rfl:    pseudoephedrine (SUDAFED) 30 MG tablet, Take 1 tablet (30 mg total) by mouth 2 (two) times daily., Disp: 20 tablet, Rfl: 0   risperiDONE (RISPERDAL) 2 MG tablet, Take 1 tablet (2 mg total) by mouth at bedtime., Disp: 14 tablet, Rfl: 0   SODIUM FLUORIDE, DENTAL RINSE, 0.2 % SOLN, See Admin Instructions., Disp: , Rfl:    tiZANidine (ZANAFLEX) 4 MG tablet, Take 1 tablet (4 mg total)  by mouth every 6 (six) hours as needed for muscle spasms., Disp: 15 tablet, Rfl: 0   trimethoprim-polymyxin b (POLYTRIM) ophthalmic solution, Place 1 drop into both eyes as directed., Disp: , Rfl:    Medications ordered in this encounter:  No orders of the defined types were placed in this encounter.    *If you need refills on other medications prior to your next appointment, please contact your pharmacy*  Follow-Up: Call back or seek an in-person evaluation if the symptoms worsen or if the condition fails to improve as anticipated.  [facility] [phone]  Other Instructions Elevate the leg while resting. Alternate ice/heat. Wear the ACE wrap.   Tylenol OTC and restart Celebrex as directed.  Follow-up with your primary care provider if symptoms not continuing to improve/resolve   If you have been instructed to have an in-person evaluation today at a local Urgent Care facility, please use the link below. It will take you to a list of all of our available [facility], including address, phone number and hours of operation. Please do not delay care.  [facility]  If you or a family member do not have a primary care provider, use the link below to schedule a visit and establish care. When you choose a [facility] primary care physician or advanced practice provider, you gain a long-term partner in health. Find a Primary Care Provider  Learn more about [facility]'s in-office and virtual care options: [facility] - Get Care Now
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
## 2023-02-12 ENCOUNTER — Ambulatory Visit: Payer: MEDICAID | Admitting: Obstetrics and Gynecology

## 2023-03-24 ENCOUNTER — Ambulatory Visit: Payer: MEDICAID | Admitting: Obstetrics & Gynecology

## 2023-04-02 ENCOUNTER — Telehealth: Payer: MEDICAID | Admitting: Physician Assistant

## 2023-04-02 DIAGNOSIS — S39012A Strain of muscle, fascia and tendon of lower back, initial encounter: Secondary | ICD-10-CM

## 2023-04-02 MED ORDER — METHOCARBAMOL 500 MG PO TABS
500.0000 mg | ORAL_TABLET | Freq: Four times a day (QID) | ORAL | 0 refills | Status: AC
Start: 2023-04-02 — End: 2023-04-09

## 2023-04-02 MED ORDER — METHYLPREDNISOLONE 4 MG PO TBPK: ORAL_TABLET | ORAL | 0 refills | Status: DC

## 2023-04-02 NOTE — Patient Instructions (Signed)
Dawn Horton, thank you for joining Laure Kidney, PA-C for today's virtual visit.  While this provider is not your primary care provider (PCP), if your PCP is located in our provider database this encounter information will be shared with them immediately following your visit.   A [facility] MyChart account gives you access to today's visit and all your visits, tests, and labs performed at [facility] " click here if you don't have a [facility] MyChart account or go to [email or web address]  Consent: (Patient) Dawn Horton provided verbal consent for this virtual visit at the beginning of the encounter.  Current Medications:  Current Outpatient Medications:    celecoxib (CELEBREX) 100 MG capsule, Take 1 capsule (100 mg total) by mouth 2 (two) times daily., Disp: 30 capsule, Rfl: 0   cetirizine (ZYRTEC) 10 MG tablet, Take 1 tablet (10 mg total) by mouth daily., Disp: 30 tablet, Rfl: 2   cyclobenzaprine (FLEXERIL) 5 MG tablet, Take 10 mg by mouth 3 (three) times daily as needed for muscle spasms., Disp: , Rfl:    DULoxetine (CYMBALTA) 30 MG capsule, Take 60 mg by mouth daily., Disp: , Rfl:    etonogestrel (NEXPLANON) 68 MG IMPL implant, 1 each by Subdermal route once., Disp: , Rfl:    fluticasone (FLONASE) 50 MCG/ACT nasal spray, Place 2 sprays into both nostrils daily., Disp: 16 mL, Rfl: 2   folic acid (FOLVITE) 1 MG tablet, Take 1 mg by mouth daily., Disp: , Rfl:    hydrOXYzine (VISTARIL) 50 MG capsule, Take 50 mg by mouth 3 (three) times daily as needed., Disp: , Rfl:    lithium carbonate (ESKALITH) 450 MG CR tablet, Take 450 mg by mouth daily. , Disp: , Rfl:    methotrexate (RHEUMATREX) 2.5 MG tablet, Take by mouth., Disp: , Rfl:    mycophenolate (CELLCEPT) 500 MG tablet, Take 1,000 mg by mouth 2 (two) times daily., Disp: , Rfl:    naproxen (NAPROSYN) 500 MG tablet, Take 500 mg by mouth 2 (two) times daily with a meal., Disp: , Rfl:    naproxen sodium (ALEVE) 220 MG tablet,  Take 220 mg by mouth daily as needed., Disp: , Rfl:    olopatadine (PATANOL) 0.1 % ophthalmic solution, Place 1 drop into both eyes 2 (two) times daily., Disp: , Rfl:    ondansetron (ZOFRAN-ODT) 4 MG disintegrating tablet, Take 1 tablet (4 mg total) by mouth every 8 (eight) hours as needed for nausea or vomiting., Disp: 20 tablet, Rfl: 0   oxyCODONE-acetaminophen (PERCOCET/ROXICET) 5-325 MG tablet, Take 1 tablet by mouth every 6 (six) hours as needed for severe pain (pain score 7-10)., Disp: 6 tablet, Rfl: 0   pregabalin (LYRICA) 100 MG capsule, 100 mg 3 (three) times daily., Disp: , Rfl:    pseudoephedrine (SUDAFED) 30 MG tablet, Take 1 tablet (30 mg total) by mouth 2 (two) times daily., Disp: 20 tablet, Rfl: 0   risperiDONE (RISPERDAL) 2 MG tablet, Take 1 tablet (2 mg total) by mouth at bedtime., Disp: 14 tablet, Rfl: 0   SODIUM FLUORIDE, DENTAL RINSE, 0.2 % SOLN, See Admin Instructions., Disp: , Rfl:    tiZANidine (ZANAFLEX) 4 MG tablet, Take 1 tablet (4 mg total) by mouth every 6 (six) hours as needed for muscle spasms., Disp: 15 tablet, Rfl: 0   trimethoprim-polymyxin b (POLYTRIM) ophthalmic solution, Place 1 drop into both eyes as directed., Disp: , Rfl:    Medications ordered in this encounter:  No orders of the defined types were placed in  this encounter.    *If you need refills on other medications prior to your next appointment, please contact your pharmacy*  Follow-Up: Call back or seek an in-person evaluation if the symptoms worsen or if the condition fails to improve as anticipated.  [facility] [phone]  Other Instructions Report to ER or Urgent care with worsening symptoms.    If you have been instructed to have an in-person evaluation today at a local Urgent Care facility, please use the link below. It will take you to a list of all of our available [facility], including address, phone number and hours of operation. Please do not delay care.   [facility]  If you or a family member do not have a primary care provider, use the link below to schedule a visit and establish care. When you choose a [facility] primary care physician or advanced practice provider, you gain a long-term partner in health. Find a Primary Care Provider  Learn more about [facility] in-office and virtual care options: [facility] - Get Care Now
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
## 2023-04-14 ENCOUNTER — Telehealth: Payer: MEDICAID

## 2023-04-25 ENCOUNTER — Ambulatory Visit: Payer: MEDICAID

## 2023-04-26 ENCOUNTER — Encounter (HOSPITAL_COMMUNITY): Payer: Self-pay

## 2023-04-26 ENCOUNTER — Ambulatory Visit (HOSPITAL_COMMUNITY)
Admission: RE | Admit: 2023-04-26 | Discharge: 2023-04-26 | Discharge status: Home / Self Care | Payer: MEDICAID | Source: Ambulatory Visit

## 2023-04-26 VITALS — BP 122/85 | HR 90 | Temp 98.1°F | Resp 18

## 2023-04-26 DIAGNOSIS — L720 Epidermal cyst: Secondary | ICD-10-CM
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
## 2023-04-26 NOTE — ED Triage Notes (Signed)
 Patient reports recurrent abscess under left arm pit. Patient states the abscess is recurrent. Abscess is painful to touch.

## 2023-04-26 NOTE — Discharge Instructions (Addendum)
 Try hot/warm compress to the area several times daily  I do recommend following up with a general surgeon  Call clinic to make appointment   If you develop any area of large swelling with redness, warmth, and pain, please return

## 2023-06-03 ENCOUNTER — Ambulatory Visit: Payer: MEDICAID | Admitting: Obstetrics and Gynecology

## 2023-06-03 ENCOUNTER — Encounter (HOSPITAL_COMMUNITY): Payer: Self-pay

## 2023-06-03 ENCOUNTER — Emergency Department (HOSPITAL_COMMUNITY)
Admission: EM | Admit: 2023-06-03 | Discharge: 2023-06-04 | Discharge status: Against Medical Advice | Payer: MEDICAID | Attending: Emergency Medicine | Admitting: Emergency Medicine

## 2023-06-03 ENCOUNTER — Other Ambulatory Visit: Payer: Self-pay

## 2023-06-03 ENCOUNTER — Emergency Department (HOSPITAL_COMMUNITY): Payer: MEDICAID

## 2023-06-03 DIAGNOSIS — M25551 Pain in right hip: Secondary | ICD-10-CM | POA: Diagnosis present

## 2023-06-03 DIAGNOSIS — Y92410 Unspecified street and highway as the place of occurrence of the external cause: Secondary | ICD-10-CM | POA: Insufficient documentation

## 2023-06-03 DIAGNOSIS — Z5321 Procedure and treatment not carried out due to patient leaving prior to being seen by health care provider: Secondary | ICD-10-CM | POA: Diagnosis not present

## 2023-06-03 DIAGNOSIS — M545 Low back pain, unspecified: Secondary | ICD-10-CM | POA: Insufficient documentation

## 2023-06-03 LAB — PREGNANCY, URINE: Preg Test, Ur: NEGATIVE
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
## 2023-06-03 NOTE — ED Notes (Signed)
 Pt left due to excessive wait

## 2023-06-04 ENCOUNTER — Ambulatory Visit (HOSPITAL_COMMUNITY): Payer: MEDICAID

## 2023-06-14 ENCOUNTER — Telehealth: Payer: MEDICAID

## 2023-06-14 ENCOUNTER — Telehealth: Payer: MEDICAID | Admitting: Physician Assistant

## 2023-06-14 DIAGNOSIS — M797 Fibromyalgia: Secondary | ICD-10-CM | POA: Diagnosis not present
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
## 2023-06-14 NOTE — Patient Instructions (Signed)
 Odis Bennetts, thank you for joining Malcom Scriver, PA-C for today's virtual visit.  While this provider is not your primary care provider (PCP), if your PCP is located in our provider database this encounter information will be shared with them immediately following your visit.   A [facility] MyChart account gives you access to today's visit and all your visits, tests, and labs performed at [facility] " click here if you don't have a [facility] MyChart account or go to [email or web address]  Consent: (Patient) Marella Syms provided verbal consent for this virtual visit at the beginning of the encounter.  Current Medications:  Current Outpatient Medications:    celecoxib  (CELEBREX ) 100 MG capsule, Take 1 capsule (100 mg total) by mouth 2 (two) times daily., Disp: 30 capsule, Rfl: 0   cetirizine  (ZYRTEC ) 10 MG tablet, Take 1 tablet (10 mg total) by mouth daily., Disp: 30 tablet, Rfl: 2   cyclobenzaprine  (FLEXERIL ) 5 MG tablet, Take 10 mg by mouth 3 (three) times daily as needed for muscle spasms., Disp: , Rfl:    DULoxetine (CYMBALTA) 30 MG capsule, Take 60 mg by mouth daily., Disp: , Rfl:    etonogestrel (NEXPLANON) 68 MG IMPL implant, 1 each by Subdermal route once., Disp: , Rfl:    fluticasone  (FLONASE ) 50 MCG/ACT nasal spray, Place 2 sprays into both nostrils daily., Disp: 16 mL, Rfl: 2   folic acid (FOLVITE) 1 MG tablet, Take 1 mg by mouth daily., Disp: , Rfl:    hydrOXYzine  (VISTARIL ) 50 MG capsule, Take 50 mg by mouth 3 (three) times daily as needed., Disp: , Rfl:    lithium carbonate (ESKALITH) 450 MG CR tablet, Take 450 mg by mouth daily. , Disp: , Rfl:    methotrexate (RHEUMATREX) 2.5 MG tablet, Take by mouth., Disp: , Rfl:    methylPREDNISolone  (MEDROL  DOSEPAK) 4 MG TBPK tablet, Take as directed on packaging, Disp: 1 each, Rfl: 0   mycophenolate (CELLCEPT) 500 MG tablet, Take 1,000 mg by mouth 2 (two) times daily., Disp: , Rfl:    naproxen  (NAPROSYN ) 500 MG tablet,  Take 500 mg by mouth 2 (two) times daily with a meal., Disp: , Rfl:    naproxen  sodium (ALEVE ) 220 MG tablet, Take 220 mg by mouth daily as needed., Disp: , Rfl:    olopatadine (PATANOL) 0.1 % ophthalmic solution, Place 1 drop into both eyes 2 (two) times daily., Disp: , Rfl:    ondansetron  (ZOFRAN -ODT) 4 MG disintegrating tablet, Take 1 tablet (4 mg total) by mouth every 8 (eight) hours as needed for nausea or vomiting., Disp: 20 tablet, Rfl: 0   oxyCODONE -acetaminophen  (PERCOCET/ROXICET) 5-325 MG tablet, Take 1 tablet by mouth every 6 (six) hours as needed for severe pain (pain score 7-10)., Disp: 6 tablet, Rfl: 0   pregabalin (LYRICA) 100 MG capsule, 100 mg 3 (three) times daily., Disp: , Rfl:    pseudoephedrine  (SUDAFED) 30 MG tablet, Take 1 tablet (30 mg total) by mouth 2 (two) times daily., Disp: 20 tablet, Rfl: 0   risperiDONE  (RISPERDAL ) 2 MG tablet, Take 1 tablet (2 mg total) by mouth at bedtime., Disp: 14 tablet, Rfl: 0   SODIUM FLUORIDE, DENTAL RINSE, 0.2 % SOLN, See Admin Instructions., Disp: , Rfl:    tiZANidine  (ZANAFLEX ) 4 MG tablet, Take 1 tablet (4 mg total) by mouth every 6 (six) hours as needed for muscle spasms., Disp: 15 tablet, Rfl: 0   trimethoprim -polymyxin b  (POLYTRIM ) ophthalmic solution, Place 1 drop into both eyes as directed., Disp: ,  Rfl:    Medications ordered in this encounter:  No orders of the defined types were placed in this encounter.    *If you need refills on other medications prior to your next appointment, please contact your pharmacy*  Follow-Up: Call back or seek an in-person evaluation if the symptoms worsen or if the condition fails to improve as anticipated.  [facility] [phone]  Other Instructions A letter, per your request was created and can be found on communications.   If you have been instructed to have an in-person evaluation today at a local Urgent Care facility, please use the link below. It will take you to a list  of all of our available [facility], including address, phone number and hours of operation. Please do not delay care.  [facility]  If you or a family member do not have a primary care provider, use the link below to schedule a visit and establish care. When you choose a [facility] primary care physician or advanced practice provider, you gain a long-term partner in health. Find a Primary Care Provider  Learn more about [facility]'s in-office and virtual care options: [facility] - Get Care Now

## 2023-08-01 ENCOUNTER — Telehealth: Payer: MEDICAID | Admitting: Family

## 2023-08-01 DIAGNOSIS — M7121 Synovial cyst of popliteal space [Baker], right knee: Secondary | ICD-10-CM

## 2023-08-01 MED ORDER — NAPROXEN 500 MG PO TABS
500.0000 mg | ORAL_TABLET | Freq: Two times a day (BID) | ORAL | 0 refills | Status: DC
Start: 2023-08-01 — End: 2023-09-14

## 2023-08-01 NOTE — Patient Instructions (Signed)
Baker Cyst  A Baker cyst, also called a popliteal cyst, is a growth that forms at the back of the knee. The cyst forms when the fluid-filled sac (bursa) behind the knee joint fills up with more fluid and becomes enlarged. What are the causes? In most cases, a Baker cyst results from another knee problem that causes swelling in the knee. This makes the fluid inside the knee joint (synovial fluid) flow into the bursa behind the knee, causing the bursa to enlarge. The fluid flows in one direction and is unable to move back into the joint. What increases the risk? You may be more likely to develop a Baker cyst if you already have a knee problem, such as: A tear in cartilage that cushions the knee joint (meniscal tear). A tear in the tissues that connect the bones of the knee joint (ligament tear). Knee swelling from osteoarthritis, rheumatoid arthritis, or gout. What are the signs or symptoms? The main symptom of this condition is a lump behind the knee. This may be the only symptom of the condition. The lump may be painful, especially when the knee is straightened. If the lump is painful, the pain may come and go. The knee may also be stiff. Symptoms may quickly get more severe if the cyst breaks open. If the cyst breaks open, you may feel the following in your knee and calf: Sudden or worsening pain. Swelling. Bruising. Redness in the calf. A Baker cyst does not always cause symptoms. How is this diagnosed? This condition may be diagnosed based on your symptoms and medical history. Your health care provider will also do a physical exam. This may include: Feeling the cyst to check whether it is tender. Checking your knee for signs of another knee condition that causes swelling. You may have imaging tests, such as X-ray, ultrasound, or MRI. How is this treated? A Baker cyst that is not painful may go away without treatment. If the cyst gets large or painful, it will likely get better if the  underlying knee problem is treated. If needed, treatment for a Baker cyst may include: Resting. Keeping weight off the knee. This means not leaning on the knee to support your body weight. Taking NSAIDs, such as ibuprofen, to reduce pain and swelling. Draining the fluid from the cyst with a needle (aspiration). Getting a steroid injection into the knee. A steroid reduces swelling. Surgery. This may be needed if other treatments do not work. This usually involves correcting knee damage and removing the cyst. In some cases, you may also need to do certain exercises to improve movement in the knee (physical therapy). Follow these instructions at home: Activity Rest as told by your provider. Avoid activities that make pain or swelling worse. Do not use the injured limb to support your body weight until your provider says that you can. Use crutches as told by your provider. Raise (elevate) your knee above the level of your heart while you are sitting or lying down. Do physical therapy exercises as told by your provider. Return to your normal activities as told by your provider. Ask your provider what activities are safe for you. General instructions Take over-the-counter and prescription medicines only as told by your provider. Keep all follow-up visits. Your provider will monitor your healing and adjust your treatment as needed. Contact a health care provider if: You have knee pain, stiffness, or swelling that does not get better. Get help right away if: You have sudden or worsening pain and   swelling in your calf area. This information is not intended to replace advice given to you by your health care provider. Make sure you discuss any questions you have with your health care provider. Document Revised: 10/02/2021 Document Reviewed: 10/02/2021 Elsevier Patient Education  2024 Elsevier Inc.  
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
## 2023-08-05 ENCOUNTER — Ambulatory Visit (HOSPITAL_COMMUNITY): Payer: MEDICAID

## 2023-08-09 ENCOUNTER — Telehealth: Payer: MEDICAID | Admitting: Family Medicine

## 2023-08-09 ENCOUNTER — Encounter: Payer: Self-pay | Admitting: Physician Assistant

## 2023-08-09 DIAGNOSIS — M545 Low back pain, unspecified: Secondary | ICD-10-CM | POA: Diagnosis not present

## 2023-08-09 MED ORDER — CYCLOBENZAPRINE HCL 10 MG PO TABS: 10.0000 mg | ORAL_TABLET | Freq: Three times a day (TID) | ORAL | 0 refills | Status: AC | PRN
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
## 2023-08-09 NOTE — Patient Instructions (Signed)
 Lumbosacral Strain A lumbosacral strain is an injury that causes pain in the lower back (lumbosacral spine). This injury usually happens from overstretching the muscles or ligaments along the spine. Ligaments are cord-like tissues that connect bones to each other. A strain can affect one or more muscles or ligaments. What are the causes? This condition may be caused by: A hard, direct hit to the back. Overstretching the lower back muscles. This may result from: A fall. Lifting something heavy. Repeated movements such as bending or crouching. What increases the risk? The following factors make you more likely to have a lumbosacral strain: Taking part in sports or activities that involve: A sudden twist of the back. Pushing or pulling motions. Being overweight or obese. Having poor strength and flexibility, especially tight hamstrings or weak muscles in the back or abdomen. Having too much of a curve in the lower back. Having a pelvis that is tilted forward. What are the signs or symptoms? The main symptom of this condition is pain in the lower back, at the site of the strain. Pain may also be felt down one or both legs. How is this diagnosed? This condition is diagnosed based on: Your symptoms and medical history. A physical exam. During the exam: Your health care provider may push on certain areas of your back to find the source of your pain. You may be asked to bend forward, backward, and side to side to check your pain and range of motion. You may also have imaging tests, such as X-rays and an MRI. How is this treated? This condition may be treated by: Applying heat and cold to the affected area. Taking medicines for pain and to relax your muscles. Taking NSAIDs, such as ibuprofen, to help reduce swelling and discomfort. Doing stretching and strengthening exercises for your lower back. Symptoms usually improve within several weeks of treatment. But recovery time varies. When your  symptoms improve, gradually return to your normal routine as soon as possible. This will help reduce pain, avoid stiffness, and keep muscle strength. Follow these instructions at home: Medicines Take over-the-counter and prescription medicines only as told by your health care provider. Ask your health care provider if the medicine prescribed to you: Requires you to avoid driving or using machinery. Can cause constipation. You may need to take these actions to prevent or treat constipation: Drink enough fluid to keep your urine pale yellow. Take over-the-counter or prescription medicines. Eat foods that are high in fiber, such as beans, whole grains, and fresh fruits and vegetables. Limit foods that are high in fat and processed sugars, such as fried or sweet foods. Managing pain, stiffness, and swelling     If told, put ice on the injured area. Put ice in a plastic bag. Place a towel between your skin and the bag. Leave the ice on for 20 minutes, 2-3 times a day. If told, apply heat to the affected area as often as told by your health care provider. Use the heat source that your health care provider recommends, such as a moist heat pack or a heating pad. Place a towel between your skin and the heat source. Leave the heat on for 20-30 minutes. If your skin turns bright red, remove the heat or ice right away to prevent skin damage. The risk of damage is higher if you cannot feel pain, heat, or cold. Activity Rest as told by your health care provider. Do not stay in bed. Staying in bed for more than 1-2 days  can delay your recovery. Return to your normal activities as told by your health care provider. Ask your health care provider what activities are safe for you. Avoid activities that take a lot of energy for as long as told by your health care provider. Do exercises as told by your health care provider. This includes stretching and strengthening exercises. General instructions      Use good posture when sitting and standing. Avoid leaning forward when you sit, and avoid hunching over when you stand. Do not use any products that contain nicotine or tobacco. These products include cigarettes, chewing tobacco, and vaping devices, such as e-cigarettes. If you need help quitting, ask your health care provider. How is this prevented?  Warm up properly before physical activity, and cool down and stretch after being active. Use correct form when playing sports. Bend your knees and use correct posture when lifting heavy objects. Maintain a healthy weight. Sleep on a mattress with medium firmness to support your back. Do at least 150 minutes of moderate-intensity exercise each week, such as brisk walking or water aerobics. Try a form of exercise that takes stress off your back, such as swimming or stationary cycling. Maintain physical fitness, including: Strength. Flexibility. Contact a health care provider if: Your back pain does not improve after several weeks of treatment. Your symptoms get worse. You have a fever. Get help right away if: Your back pain is severe. You cannot stand or walk. You feel nauseous or you vomit. You develop any of the following: Trouble controlling when you urinate or when you have a bowel movement. Pain in your legs. Your feet or legs get very cold, turn pale, or look blue. Weakness in your buttocks or legs. This information is not intended to replace advice given to you by your health care provider. Make sure you discuss any questions you have with your health care provider. Document Revised: 06/09/2022 Document Reviewed: 08/27/2021 Elsevier Patient Education  2024 [employer].

## 2023-08-11 ENCOUNTER — Ambulatory Visit: Payer: MEDICAID

## 2023-08-11 DIAGNOSIS — Z23 Encounter for immunization: Secondary | ICD-10-CM | POA: Diagnosis not present

## 2023-08-11 NOTE — Progress Notes (Signed)
 Dawn Horton is here for their 2nd Gardasil injection. First was given two months ago at [facility]. Pt denies any issues at this time. Pt tolerated injection well. To follow up in 4 months for next injection.   Pt also had concerns about genital warts, and being due for a pap smear. Informed pt we would make her a provider visit at checkout for these concerns.

## 2023-08-25 ENCOUNTER — Encounter: Payer: Self-pay | Admitting: Obstetrics & Gynecology

## 2023-08-25 ENCOUNTER — Ambulatory Visit: Payer: MEDICAID | Admitting: Obstetrics & Gynecology

## 2023-08-25 VITALS — BP 124/78 | HR 65 | Ht 64.5 in | Wt 191.0 lb

## 2023-08-25 DIAGNOSIS — Z1331 Encounter for screening for depression: Secondary | ICD-10-CM | POA: Diagnosis not present

## 2023-08-25 DIAGNOSIS — L720 Epidermal cyst: Secondary | ICD-10-CM

## 2023-08-25 DIAGNOSIS — K644 Residual hemorrhoidal skin tags: Secondary | ICD-10-CM

## 2023-08-25 DIAGNOSIS — Z01419 Encounter for gynecological examination (general) (routine) without abnormal findings: Secondary | ICD-10-CM | POA: Diagnosis not present

## 2023-08-25 NOTE — Progress Notes (Signed)
 Patient ID: Dawn Horton, female   DOB: April 03, 1985, 38 y.o.   MRN: [id number]  Chief Complaint  Patient presents with   Annual Exam  Referred for evaluation of possible vulvar and perirectal warts  HPI Dawn Horton is a 38 y.o. female.  [id number] No LMP recorded. Patient has had an implant. She has vulvar and perianal lesions that may be warts. States she has had hemorrhoids. HPI  Past Medical History:  Diagnosis Date   Anemia    Bipolar 1 disorder (HCC)    schizoaffective   Depression    GERD (gastroesophageal reflux disease)    Tendinitis     Past Surgical History:  Procedure Laterality Date   CESAREAN SECTION      Family History  Problem Relation Age of Onset   Healthy Mother    Healthy Father     Social History Social History   Tobacco Use   Smoking status: Every Day    Current packs/day: 1.00    Average packs/day: 1 pack/day for 15.0 years (15.0 ttl pk-yrs)    Types: Cigarettes    Passive exposure: Current   Smokeless tobacco: Never  Vaping Use   Vaping status: Never Used  Substance Use Topics   Alcohol use: Yes    Comment: Occassionally.   Drug use: Yes    Types: Marijuana    Comment: occ    Allergies  Allergen Reactions   Aripiprazole Other (See Comments)    Makes pt delirious and forget things. Generic for Abilify.  Other Reaction(s): Other   Penicillins Hives    amoxicillin  Other Reaction(s): Not available  08/07/2017 -  08/07/2017 -    amoxicillin  amoxicillin  08/07/2017 -   Ibuprofen  Nausea Only    Current Outpatient Medications  Medication Sig Dispense Refill   cetirizine  (ZYRTEC ) 10 MG tablet Take 1 tablet (10 mg total) by mouth daily. 30 tablet 2   cyclobenzaprine  (FLEXERIL ) 10 MG tablet Take 1 tablet (10 mg total) by mouth 3 (three) times daily as needed for muscle spasms. 30 tablet 0   DULoxetine (CYMBALTA) 30 MG capsule Take 60 mg by mouth daily.     etonogestrel (NEXPLANON) 68 MG IMPL implant 1 each by Subdermal route  once.     fluticasone  (FLONASE ) 50 MCG/ACT nasal spray Place 2 sprays into both nostrils daily. 16 mL 2   hydrOXYzine  (VISTARIL ) 50 MG capsule Take 50 mg by mouth 3 (three) times daily as needed.     lithium carbonate 300 MG capsule Take 300 mg by mouth 2 (two) times daily with a meal.     mycophenolate (CELLCEPT) 500 MG tablet Take 1,000 mg by mouth 2 (two) times daily.     naproxen  (NAPROSYN ) 500 MG tablet Take 1 tablet (500 mg total) by mouth 2 (two) times daily with a meal. 30 tablet 0   pregabalin (LYRICA) 100 MG capsule 100 mg 3 (three) times daily.     risperiDONE  (RISPERDAL ) 2 MG tablet Take 1 tablet (2 mg total) by mouth at bedtime. 14 tablet 0   naproxen  sodium (ALEVE ) 220 MG tablet Take 220 mg by mouth daily as needed.     SODIUM FLUORIDE, DENTAL RINSE, 0.2 % SOLN See Admin Instructions.     No current facility-administered medications for this visit.    Review of Systems Review of Systems  Gastrointestinal:  Negative for anal bleeding and rectal pain.  Genitourinary:  Negative for menstrual problem, vaginal discharge and vaginal pain.    Blood pressure 124/78,  pulse 65, height 5' 4.5 (1.638 m), weight 191 lb (86.6 kg).  Physical Exam Physical Exam Vitals and nursing note reviewed. Exam conducted with a chaperone present.  Constitutional:      Appearance: Normal appearance.  HENT:     Head: Normocephalic and atraumatic.  Cardiovascular:     Rate and Rhythm: Normal rate.  Pulmonary:     Effort: Pulmonary effort is normal.  Abdominal:     General: Abdomen is flat.  Genitourinary:    Vagina: Normal.     Cervix: Normal.     Uterus: Normal.       Comments: Epidermal inclusion cyst firm and non tender left labium minor, hemorrhoidal skin tags perianal area, no inflammation no thrombosis Neurological:     Mental Status: She is alert.  Psychiatric:        Mood and Affect: Mood normal.        Behavior: Behavior normal.     Data  Reviewed   Assessment Hemorrhoidal skin tags - Plan: Ambulatory referral to [facility]  Epidermal inclusion cyst Depression screen positive  Plan Expectant management and local tx for hemorrhoid prn, referral to surgeon if she desires Inclusion cyst appears relatively asymptomatic and I don't recommend any intervention No condylomata identified    Lynwood Solomons 08/25/2023, 2:05 PM

## 2023-08-25 NOTE — Progress Notes (Signed)
 Pt is new to office, pt needs routine AEX.  Pt states last pap was 1-2 years ago- normal at [facility].  Pt currently has Nexplanon  in place - scheduled with PCP to change.   Pt states she has genital warts and would like to discuss tx options.

## 2023-09-14 ENCOUNTER — Telehealth: Payer: MEDICAID | Admitting: Physician Assistant

## 2023-09-14 DIAGNOSIS — M25551 Pain in right hip: Secondary | ICD-10-CM

## 2023-09-14 DIAGNOSIS — M797 Fibromyalgia: Secondary | ICD-10-CM

## 2023-09-14 MED ORDER — NAPROXEN 500 MG PO TABS: 500.0000 mg | ORAL_TABLET | Freq: Two times a day (BID) | ORAL | 0 refills | Status: AC
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
## 2023-09-14 NOTE — Patient Instructions (Signed)
 Dawn Horton, thank you for joining Dawn CHRISTELLA Dickinson, PA-C for today's virtual visit.  While this provider is not your primary care provider (PCP), if your PCP is located in our provider database this encounter information will be shared with them immediately following your visit.   A [facility] MyChart account gives you access to today's visit and all your visits, tests, and labs performed at [facility]  click here if you don't have a [facility] MyChart account or go to [email or web address]  Consent: (Patient) Dawn Horton provided verbal consent for this virtual visit at the beginning of the encounter.  Current Medications:  Current Outpatient Medications:    naproxen  (NAPROSYN ) 500 MG tablet, Take 1 tablet (500 mg total) by mouth 2 (two) times daily with a meal., Disp: 30 tablet, Rfl: 0   cetirizine  (ZYRTEC ) 10 MG tablet, Take 1 tablet (10 mg total) by mouth daily., Disp: 30 tablet, Rfl: 2   cyclobenzaprine  (FLEXERIL ) 10 MG tablet, Take 1 tablet (10 mg total) by mouth 3 (three) times daily as needed for muscle spasms., Disp: 30 tablet, Rfl: 0   DULoxetine (CYMBALTA) 30 MG capsule, Take 60 mg by mouth daily., Disp: , Rfl:    etonogestrel (NEXPLANON) 68 MG IMPL implant, 1 each by Subdermal route once., Disp: , Rfl:    fluticasone  (FLONASE ) 50 MCG/ACT nasal spray, Place 2 sprays into both nostrils daily., Disp: 16 mL, Rfl: 2   hydrOXYzine  (VISTARIL ) 50 MG capsule, Take 50 mg by mouth 3 (three) times daily as needed., Disp: , Rfl:    lithium carbonate 300 MG capsule, Take 300 mg by mouth 2 (two) times daily with a meal., Disp: , Rfl:    mycophenolate (CELLCEPT) 500 MG tablet, Take 1,000 mg by mouth 2 (two) times daily., Disp: , Rfl:    naproxen  sodium (ALEVE ) 220 MG tablet, Take 220 mg by mouth daily as needed., Disp: , Rfl:    pregabalin (LYRICA) 100 MG capsule, 100 mg 3 (three) times daily., Disp: , Rfl:    risperiDONE  (RISPERDAL ) 2 MG tablet, Take 1 tablet (2 mg total)  by mouth at bedtime., Disp: 14 tablet, Rfl: 0   SODIUM FLUORIDE, DENTAL RINSE, 0.2 % SOLN, See Admin Instructions., Disp: , Rfl:    Medications ordered in this encounter:  Meds ordered this encounter  Medications   naproxen  (NAPROSYN ) 500 MG tablet    Sig: Take 1 tablet (500 mg total) by mouth 2 (two) times daily with a meal.    Dispense:  30 tablet    Refill:  0    Supervising Provider:   BLAISE ALEENE KIDD [[id number]]     *If you need refills on other medications prior to your next appointment, please contact your pharmacy*  Follow-Up: Call back or seek an in-person evaluation if the symptoms worsen or if the condition fails to improve as anticipated.  [facility] [phone]  Other Instructions Hip Exercises Ask your health care provider which exercises are safe for you. Do exercises exactly as told by your provider and adjust them as told. It is normal to feel mild stretching, pulling, tightness, or discomfort as you do these exercises. Stop right away if you feel sudden pain or your pain gets worse. Do not begin these exercises until told by your provider. Stretching and range-of-motion exercises These exercises warm up your muscles and joints and improve the movement and flexibility of your hip. They also help to relieve pain, numbness, and tingling. You may be asked  to limit your range of motion if you had a hip replacement. Talk to your provider about these limits. Hamstrings, supine  Lie on your back (supine position). Loop a belt, towel, or exercise band over the ball of your left / right foot. The ball of your foot is on the walking surface, right under your toes. Straighten your left / right knee and slowly pull on the belt, towel, or band to raise your leg until you feel a gentle stretch behind your knee (hamstring). Do not let your knee bend while you do this. Keep your other leg flat on the floor. Hold this position for __________ seconds. Slowly return  your leg to the starting position. Repeat __________ times. Complete this exercise __________ times a day. Hip rotation  Lie on your back on a firm surface. With your left / right hand, gently pull your left / right knee toward the shoulder that is on the same side of the body. Stop when your knee is pointing toward the ceiling. Hold your left / right ankle with your other hand. Keeping your knee steady, gently pull your left / right ankle toward your other shoulder until you feel a stretch in your butt. Keep your hips and shoulders firmly planted while you do this stretch. Hold this position for __________ seconds. Repeat __________ times. Complete this exercise __________ times a day. Seated stretch This exercise is sometimes called hamstrings and adductors stretch. Sit on the floor with your legs stretched wide. Keep your knees straight during this exercise. Keeping your head and back in a straight line, bend at your waist to reach for your left foot (position A). You should feel a stretch in your right inner thigh (adductors). Hold this position for __________ seconds. Then slowly return to the upright position. Keeping your head and back in a straight line, bend at your waist to reach forward (position B). You should feel a stretch behind both of your thighs and knees (hamstrings). Hold this position for __________ seconds. Then slowly return to the upright position. Keeping your head and back in a straight line, bend at your waist to reach for your right foot (position C). You should feel a stretch in your left inner thigh (adductors). Hold this position for __________ seconds. Then slowly return to the upright position. Repeat __________ times. Complete this exercise __________ times a day. Lunge This exercise stretches the muscles of the hip (hip flexors). Place your left / right knee on the floor and bend your other knee so that is directly over your ankle. You should be  half-kneeling. Keep good posture with your head over your shoulders. Tighten your butt muscles to point your tailbone downward. This will prevent your back from arching too much. You should feel a gentle stretch in the front of your left / right thigh and hip. If you do not feel a stretch, slide your other foot forward slightly and then slowly lunge forward with your chest up until your knee once again lines up over your ankle. Make sure your tailbone continues to point downward. Hold this position for __________ seconds. Slowly return to the starting position. Repeat __________ times. Complete this exercise __________ times a day. Strengthening exercises These exercises build strength and endurance in your hip. Endurance is the ability to use your muscles for a long time, even after they get tired. Bridge This exercise strengthens the muscles of your hip (hip extensors). Lie on your back on a firm surface with your knees  bent and your feet flat on the floor. Tighten your butt muscles and lift your bottom off the floor until the trunk of your body and your hips are level with your thighs. Do not arch your back. You should feel the muscles working in your butt and the back of your thighs. If you do not feel these muscles, slide your feet 1-2 inches (2.5-5 cm) farther away from your butt. Hold this position for __________ seconds. Slowly lower your hips to the starting position. Let your muscles relax completely between repetitions. Repeat __________ times. Complete this exercise __________ times a day. Straight leg raises, side-lying This exercise strengthens the muscles that move the hip joint away from the center of the body (hip abductors). Lie on your side with your left / right leg in the top position. Lie so your head, shoulder, hip, and knee line up. You may bend your bottom knee slightly to help you balance. Roll your hips slightly forward, so your hips are stacked directly over each  other and your left / right knee is facing forward. Leading with your heel, lift your top leg 4-6 inches (10-15 cm). You should feel the muscles in your top hip lifting. Do not let your foot drift forward. Do not let your knee roll toward the ceiling. Hold this position for __________ seconds. Slowly return to the starting position. Let your muscles relax completely between repetitions. Repeat __________ times. Complete this exercise __________ times a day. Straight leg raises, side-lying This exercise strengthens the muscles that move the hip joint toward the center of the body (hip adductors). Lie on your side with your left / right leg in the bottom position. Lie so your head, shoulder, hip, and knee line up. You may place your upper foot in front to help you balance. Roll your hips slightly forward, so your hips are stacked directly over each other and your left / right knee is facing forward. Tense the muscles in your inner thigh and lift your bottom leg 4-6 inches (10-15 cm). Hold this position for __________ seconds. Slowly return to the starting position. Let your muscles relax completely between repetitions. Repeat __________ times. Complete this exercise __________ times a day. Straight leg raises, supine This exercise strengthens the muscles in the front of your thigh (quadriceps and hip flexors). Lie on your back (supine position) with your left / right leg extended and your other knee bent. Tense the muscles in the front of your left / right thigh. You should see your kneecap slide up or see increased dimpling just above your knee. Keep these muscles tight as you raise your leg 4-6 inches (10-15 cm) off the floor. Do not let your knee bend. Hold this position for __________ seconds. Keep these muscles tense as you lower your leg. Relax the muscles slowly and completely between repetitions. Repeat __________ times. Complete this exercise __________ times a day. Hip abductors,  standing This exercise strengthens the muscles that move the leg and hip joint away from the center of the body (hip abductors). Tie one end of a rubber exercise band or tubing to a secure surface, such as a chair, table, or pole. Loop the other end of the band or tubing around your left / right ankle. Keeping your ankle with the band or tubing directly opposite the secured end, step away until there is tension in the tubing or band. Hold on to a chair, table, or pole as needed for balance. Lift your left / right leg  out to your side. While you do this: Keep your back upright. Keep your shoulders over your hips. Keep your toes pointing forward. Make sure to use your hip muscles to slowly lift your leg. Do not tip your body or forcefully lift your leg. Hold this position for __________ seconds. Slowly return to the starting position. Repeat __________ times. Complete this exercise __________ times a day. Squats This exercise strengthens the muscles in the front of your thigh (quadriceps). Stand in front of a table, or stand in a doorframe so your feet and knees are in line with the frame. You may place your hands on the table or frame for balance. Slowly bend your knees and lower your hips like you are going to sit in a chair. Keep your lower legs in a straight up-and-down position. Do not let your hips go lower than your knees. Do not bend your knees lower than told by your provider. If your hip pain increases, do not bend as low. Hold this position for ___________ seconds. Slowly push with your legs to return to standing. Do not use your hands to pull yourself to standing. Repeat __________ times. Complete this exercise __________ times a day. This information is not intended to replace advice given to you by your health care provider. Make sure you discuss any questions you have with your health care provider. Document Revised: 10/08/2021 Document Reviewed: 10/08/2021 Elsevier Patient  Education  2024 [employer]   If you have been instructed to have an in-person evaluation today at a local Urgent Care facility, please use the link below. It will take you to a list of all of our available [facility], including address, phone number and hours of operation. Please do not delay care.  [facility]  If you or a family member do not have a primary care provider, use the link below to schedule a visit and establish care. When you choose a [facility] primary care physician or advanced practice provider, you gain a long-term partner in health. Find a Primary Care Provider  Learn more about [facility]'s in-office and virtual care options: [facility] - Get Care Now

## 2024-01-05 ENCOUNTER — Emergency Department (HOSPITAL_COMMUNITY): Admission: EM | Admit: 2024-01-05 | Discharge: 2024-01-06 | Discharge status: Against Medical Advice | Payer: MEDICAID

## 2024-01-05 ENCOUNTER — Other Ambulatory Visit: Payer: Self-pay

## 2024-01-05 DIAGNOSIS — Z5321 Procedure and treatment not carried out due to patient leaving prior to being seen by health care provider: Secondary | ICD-10-CM | POA: Diagnosis not present

## 2024-01-05 DIAGNOSIS — R1030 Lower abdominal pain, unspecified: Secondary | ICD-10-CM | POA: Insufficient documentation

## 2024-01-05 DIAGNOSIS — R110 Nausea: Secondary | ICD-10-CM | POA: Insufficient documentation

## 2024-01-05 DIAGNOSIS — R196 Halitosis: Secondary | ICD-10-CM | POA: Insufficient documentation

## 2024-01-05 LAB — CBC WITH DIFFERENTIAL/PLATELET
Abs Immature Granulocytes: 0.03 K/uL (ref 0.00–0.07)
Basophils Absolute: 0.1 K/uL (ref 0.0–0.1)
Basophils Relative: 0 %
Eosinophils Absolute: 0.4 K/uL (ref 0.0–0.5)
Eosinophils Relative: 3 %
HCT: 37.5 % (ref 36.0–46.0)
Hemoglobin: 11.6 g/dL — ABNORMAL LOW (ref 12.0–15.0)
Immature Granulocytes: 0 %
Lymphocytes Relative: 34 %
Lymphs Abs: 4.9 K/uL — ABNORMAL HIGH (ref 0.7–4.0)
MCH: 32.7 pg (ref 26.0–34.0)
MCHC: 30.9 g/dL (ref 30.0–36.0)
MCV: 105.6 fL — ABNORMAL HIGH (ref 80.0–100.0)
Monocytes Absolute: 1 K/uL (ref 0.1–1.0)
Monocytes Relative: 7 %
Neutro Abs: 8.1 K/uL — ABNORMAL HIGH (ref 1.7–7.7)
Neutrophils Relative %: 56 %
Platelets: 208 K/uL (ref 150–400)
RBC: 3.55 MIL/uL — ABNORMAL LOW (ref 3.87–5.11)
RDW: 13.4 % (ref 11.5–15.5)
WBC: 14.5 K/uL — ABNORMAL HIGH (ref 4.0–10.5)
nRBC: 0 % (ref 0.0–0.2)

## 2024-01-05 LAB — COMPREHENSIVE METABOLIC PANEL WITH GFR
ALT: 11 U/L (ref 0–44)
AST: 11 U/L — ABNORMAL LOW (ref 15–41)
Albumin: 3.7 g/dL (ref 3.5–5.0)
Alkaline Phosphatase: 69 U/L (ref 38–126)
Anion gap: 11 (ref 5–15)
BUN: <5 mg/dL — ABNORMAL LOW (ref 6–20)
CO2: 23 mmol/L (ref 22–32)
Calcium: 9.8 mg/dL (ref 8.9–10.3)
Chloride: 105 mmol/L (ref 98–111)
Creatinine, Ser: 0.86 mg/dL (ref 0.44–1.00)
GFR, Estimated: >60 mL/min (ref >60)
Glucose, Bld: 79 mg/dL (ref 70–99)
Potassium: 3.4 mmol/L — ABNORMAL LOW (ref 3.5–5.1)
Sodium: 139 mmol/L (ref 135–145)
Total Bilirubin: 0.5 mg/dL (ref 0.0–1.2)
Total Protein: 6.7 g/dL (ref 6.5–8.1)

## 2024-01-05 LAB — LIPASE, BLOOD: Lipase: 31 U/L (ref 11–51)

## 2024-01-05 NOTE — ED Triage Notes (Addendum)
 Pt C/o lower abd pain, difficulty having BM,  hemorrhoids that bleed just after having BM, and foul smelling breath. LBM was today and it was watery.

## 2024-01-05 NOTE — ED Provider Triage Note (Signed)
 Emergency Medicine Provider Triage Evaluation Note  Dawn Horton , a 38 y.o. female  was evaluated in triage.  Pt complains of abdominal discomfort and bloating for the past year that is no worse today.  She reports bright red blood on toilet paper though this is her baseline as she has hemorrhoids.  She denies frank blood in stool or dark stool.  The main change recently that encouraged her to come in is her breath smells like poop.  She denies any episodes of vomiting.  She reports nausea whenever she feels hungry, but is able to eat normally.  Last bowel movement earlier today that was watery; prior to that last bowel movement was the day before yesterday.  Review of Systems  Positive: Abdominal pain, nausea with hunger, bad smelling breath. Negative: Fevers, chest pain, shortness of breath, vomiting, constipation.  Physical Exam  BP 120/68 (BP Location: Right Arm)   Pulse (!) 59   Temp 97.9 F (36.6 C)   Resp 16   Ht 5' 4 (1.626 m)   Wt 86 kg   SpO2 100%   BMI 32.54 kg/m  Gen:   Awake, no distress   Resp:  Normal effort  MSK:   Moves extremities without difficulty  Other:  Diffuse lower abdominal tenderness with palpation. Epigastric pain with palpation.  Medical Decision Making  Medically screening exam initiated at 9:49 PM.  Appropriate orders placed.  Dawn Horton was informed that the remainder of the evaluation will be completed by another provider, this initial triage assessment does not replace that evaluation, and the importance of remaining in the ED until their evaluation is complete.   Rosina Almarie LABOR, PA-C 01/05/24 2155

## 2024-01-06 LAB — HCG, QUANTITATIVE, PREGNANCY: hCG, Beta Chain, Quant, S: <1 m[IU]/mL (ref <5)

## 2024-01-06 NOTE — ED Notes (Signed)
Pt called for vital signs, no answer.  

## 2024-01-29 ENCOUNTER — Emergency Department (HOSPITAL_COMMUNITY)
Admission: EM | Admit: 2024-01-29 | Discharge: 2024-01-29 | Disposition: A | Discharge status: Home / Self Care | Payer: MEDICAID

## 2024-01-29 ENCOUNTER — Encounter (HOSPITAL_COMMUNITY): Payer: Self-pay

## 2024-01-29 ENCOUNTER — Other Ambulatory Visit: Payer: Self-pay

## 2024-01-29 DIAGNOSIS — Z5321 Procedure and treatment not carried out due to patient leaving prior to being seen by health care provider: Secondary | ICD-10-CM | POA: Insufficient documentation

## 2024-01-29 DIAGNOSIS — R0789 Other chest pain: Secondary | ICD-10-CM | POA: Insufficient documentation

## 2024-01-29 LAB — CBC WITH DIFFERENTIAL/PLATELET
Abs Immature Granulocytes: 0.03 K/uL (ref 0.00–0.07)
Basophils Absolute: 0.1 K/uL (ref 0.0–0.1)
Basophils Relative: 1 %
Eosinophils Absolute: 0.3 K/uL (ref 0.0–0.5)
Eosinophils Relative: 3 %
HCT: 43.2 % (ref 36.0–46.0)
Hemoglobin: 13.6 g/dL (ref 12.0–15.0)
Immature Granulocytes: 0 %
Lymphocytes Relative: 33 %
Lymphs Abs: 4.1 K/uL — ABNORMAL HIGH (ref 0.7–4.0)
MCH: 33.2 pg (ref 26.0–34.0)
MCHC: 31.5 g/dL (ref 30.0–36.0)
MCV: 105.4 fL — ABNORMAL HIGH (ref 80.0–100.0)
Monocytes Absolute: 0.9 K/uL (ref 0.1–1.0)
Monocytes Relative: 7 %
Neutro Abs: 6.9 K/uL (ref 1.7–7.7)
Neutrophils Relative %: 56 %
Platelets: 207 K/uL (ref 150–400)
RBC: 4.1 MIL/uL (ref 3.87–5.11)
RDW: 13 % (ref 11.5–15.5)
WBC: 12.4 K/uL — ABNORMAL HIGH (ref 4.0–10.5)
nRBC: 0 % (ref 0.0–0.2)

## 2024-01-29 LAB — COMPREHENSIVE METABOLIC PANEL WITH GFR
ALT: 18 U/L (ref 0–44)
AST: 18 U/L (ref 15–41)
Albumin: 3.7 g/dL (ref 3.5–5.0)
Alkaline Phosphatase: 68 U/L (ref 38–126)
Anion gap: 7 (ref 5–15)
BUN: <5 mg/dL — ABNORMAL LOW (ref 6–20)
CO2: 24 mmol/L (ref 22–32)
Calcium: 9.6 mg/dL (ref 8.9–10.3)
Chloride: 110 mmol/L (ref 98–111)
Creatinine, Ser: 0.86 mg/dL (ref 0.44–1.00)
GFR, Estimated: >60 mL/min (ref >60)
Glucose, Bld: 92 mg/dL (ref 70–99)
Potassium: 4 mmol/L (ref 3.5–5.1)
Sodium: 141 mmol/L (ref 135–145)
Total Bilirubin: 0.5 mg/dL (ref 0.0–1.2)
Total Protein: 6.8 g/dL (ref 6.5–8.1)

## 2024-01-29 LAB — LIPASE, BLOOD: Lipase: 43 U/L (ref 11–51)

## 2024-01-29 NOTE — ED Notes (Signed)
Pt called for vitals, no response. 

## 2024-01-29 NOTE — ED Notes (Signed)
 Last call for vitals check and no response.SABRASABRA

## 2024-01-29 NOTE — ED Provider Triage Note (Signed)
 Emergency Medicine Provider Triage Evaluation Note  Dawn Horton , a 38 y.o. female  was evaluated in triage.  Pt complains of nausea, epigastric and right upper quadrant abdominal pain, right side of chest/shoulder pain, abdominal bloating, and diarrhea x 1 year.  Patient reports symptoms seem to be related to when she eats heavy meals.  She does have a gallbladder.  She denies shortness of breath with these symptoms.  Patient states that she has been told that she has acid reflux in the past however she is scared to take any medications related to this for fear that they cause cancer.  Patient reports she is a social drinker drinking 1-2 drinks (beer) 2-3 times weekly.  She does admit to more heavy drinking in her past.  Review of Systems  Positive: Abdominal bloating, epigastric and right upper quadrant abdominal pain that radiates to the right chest wall and right shoulder, diarrhea, nausea Negative: Vomiting, shortness of breath, dizziness, syncope  Physical Exam  BP (!) 145/76 (BP Location: Right Arm)   Pulse 63   Temp 98.1 F (36.7 C)   Resp 20   Ht 5' 4.5 (1.638 m)   Wt 86.6 kg   SpO2 100%   BMI 32.28 kg/m  Gen:   Awake, no distress   Resp:  Normal effort  MSK:   Moves extremities without difficulty  Other:  Diffuse abdominal distention.  Pain with palpation of the epigastric region.  Medical Decision Making  Medically screening exam initiated at 3:32 PM.  Appropriate orders placed.  Dawn Horton was informed that the remainder of the evaluation will be completed by another provider, this initial triage assessment does not replace that evaluation, and the importance of remaining in the ED until their evaluation is complete.   Rosina Almarie LABOR, PA-C 01/29/24 1535

## 2024-01-29 NOTE — ED Notes (Signed)
 Called PT three times and no response.SABRASABRASABRA

## 2024-01-29 NOTE — ED Triage Notes (Signed)
 Pt c.o right sided chest pain that radiates into her shoulder for months.
